# Patient Record
Sex: Male | Born: 1960 | Race: White | Hispanic: No | Marital: Single | Smoking: Current every day smoker
Health system: Southern US, Community
[De-identification: ages and names within clinical notes are randomized; demographics above are authoritative.]

## PROBLEM LIST (undated history)

## (undated) DIAGNOSIS — C01 Malignant neoplasm of base of tongue: Secondary | ICD-10-CM

## (undated) DIAGNOSIS — Z720 Tobacco use: Secondary | ICD-10-CM

## (undated) DIAGNOSIS — C099 Malignant neoplasm of tonsil, unspecified: Secondary | ICD-10-CM

## (undated) DIAGNOSIS — I10 Essential (primary) hypertension: Secondary | ICD-10-CM

## (undated) HISTORY — DX: Essential (primary) hypertension: I10

---

## 2015-08-03 ENCOUNTER — Encounter: Payer: Self-pay | Admitting: Emergency Medicine

## 2015-08-03 ENCOUNTER — Emergency Department: Payer: Medicaid Other

## 2015-08-03 ENCOUNTER — Emergency Department
Admission: EM | Admit: 2015-08-03 | Discharge: 2015-08-03 | Disposition: A | Discharge status: Home / Self Care | Payer: Medicaid Other | Attending: Emergency Medicine | Admitting: Emergency Medicine

## 2015-08-03 DIAGNOSIS — J359 Chronic disease of tonsils and adenoids, unspecified: Secondary | ICD-10-CM | POA: Diagnosis not present

## 2015-08-03 DIAGNOSIS — K920 Hematemesis: Secondary | ICD-10-CM | POA: Diagnosis present

## 2015-08-03 DIAGNOSIS — R220 Localized swelling, mass and lump, head: Secondary | ICD-10-CM

## 2015-08-03 DIAGNOSIS — F17210 Nicotine dependence, cigarettes, uncomplicated: Secondary | ICD-10-CM | POA: Diagnosis not present

## 2015-08-03 DIAGNOSIS — R042 Hemoptysis: Secondary | ICD-10-CM

## 2015-08-03 DIAGNOSIS — J069 Acute upper respiratory infection, unspecified: Secondary | ICD-10-CM

## 2015-08-03 DIAGNOSIS — J029 Acute pharyngitis, unspecified: Secondary | ICD-10-CM

## 2015-08-03 DIAGNOSIS — J358 Other chronic diseases of tonsils and adenoids: Secondary | ICD-10-CM

## 2015-08-03 LAB — COMPREHENSIVE METABOLIC PANEL
ALBUMIN: 3.9 g/dL (ref 3.5–5.0)
ALT: 17 U/L (ref 17–63)
AST: 22 U/L (ref 15–41)
Alkaline Phosphatase: 98 U/L (ref 38–126)
Anion gap: 8 (ref 5–15)
BUN: 17 mg/dL (ref 6–20)
CALCIUM: 9.1 mg/dL (ref 8.9–10.3)
CO2: 25 mmol/L (ref 22–32)
Chloride: 101 mmol/L (ref 101–111)
Creatinine, Ser: 0.79 mg/dL (ref 0.61–1.24)
GFR calc Af Amer: >60 mL/min (ref >60)
GFR calc non Af Amer: >60 mL/min (ref >60)
GLUCOSE: 103 mg/dL — AB (ref 65–99)
Potassium: 3.5 mmol/L (ref 3.5–5.1)
SODIUM: 134 mmol/L — AB (ref 135–145)
Total Bilirubin: 0.8 mg/dL (ref 0.3–1.2)
Total Protein: 8 g/dL (ref 6.5–8.1)

## 2015-08-03 LAB — RAPID INFLUENZA A&B ANTIGENS
Influenza A (ARMC): NOT DETECTED — AB
Influenza B (ARMC): NOT DETECTED — AB

## 2015-08-03 LAB — CBC
HEMATOCRIT: 44.1 % (ref 40.0–52.0)
HEMOGLOBIN: 15.1 g/dL (ref 13.0–18.0)
MCH: 31.5 pg (ref 26.0–34.0)
MCHC: 34.2 g/dL (ref 32.0–36.0)
MCV: 92.3 fL (ref 80.0–100.0)
Platelets: 184 10*3/uL (ref 150–440)
RBC: 4.78 MIL/uL (ref 4.40–5.90)
RDW: 13.4 % (ref 11.5–14.5)
WBC: 9.9 10*3/uL (ref 3.8–10.6)

## 2015-08-03 LAB — MONONUCLEOSIS SCREEN: Mono Screen: NEGATIVE

## 2015-08-03 LAB — LIPASE, BLOOD: Lipase: 34 U/L (ref 11–51)

## 2015-08-03 MED ORDER — ONDANSETRON HCL 4 MG/2ML IJ SOLN
4.0000 mg | Freq: Once | INTRAMUSCULAR | Status: AC
  Administered 2015-08-03: 4 mg via INTRAVENOUS

## 2015-08-03 MED ORDER — IOHEXOL 300 MG/ML  SOLN
75.0000 mL | Freq: Once | INTRAMUSCULAR | Status: AC | PRN
  Administered 2015-08-03: 75 mL via INTRAVENOUS

## 2015-08-03 MED ORDER — FENTANYL CITRATE (PF) 100 MCG/2ML IJ SOLN
50.0000 ug | Freq: Once | INTRAMUSCULAR | Status: AC
  Administered 2015-08-03: 50 ug via INTRAVENOUS

## 2015-08-03 MED ORDER — SODIUM CHLORIDE 0.9 % IV BOLUS (SEPSIS)
1000.0000 mL | Freq: Once | INTRAVENOUS | Status: AC
  Administered 2015-08-03: 1000 mL via INTRAVENOUS

## 2015-08-03 MED ORDER — OXYCODONE HCL 5 MG PO TABS: 5.0000 mg | ORAL_TABLET | ORAL | Status: DC | PRN

## 2015-08-03 MED ORDER — PENICILLIN G BENZATHINE 1200000 UNIT/2ML IM SUSP
1.2000 10*6.[IU] | Freq: Once | INTRAMUSCULAR | Status: AC
  Administered 2015-08-03: 1.2 10*6.[IU] via INTRAMUSCULAR
  Filled 2015-08-03: qty 2

## 2015-08-03 NOTE — ED Notes (Signed)
Vomiting blood since yesterday, 3 episodes.

## 2015-08-03 NOTE — Discharge Instructions (Signed)
Please make an appointment with Dr. Charolett Bumpers for further evaluation of your tonsil mass. These return to the emergency department if you develop severe pain, increased swelling, difficulty swallowing, difficulty breathing, severe headache, or any other symptoms concerning to you.

## 2015-08-03 NOTE — ED Notes (Signed)
Pt reports swelling to right side of neck and feeling tired for several days.  Pt has swelling noted to right side of neck and reports a cough also.

## 2015-08-03 NOTE — ED Provider Notes (Signed)
[facility] Emergency Department Provider Note  ____________________________________________  Time seen: Approximately 3:30 PM  I have reviewed the triage vital signs and the nursing notes.   HISTORY  Chief Complaint Hematemesis    HPI Ryan Rush is a 55 y.o. male with ongoing tobacco abuse but otherwise healthy presenting with cough, hemoptysis, hematemesis.Pt reports that for the past 2 days he has had a cough and swelling around the right jaw that is tender to palpation. Yesterday had a single episode of cough productive of blood-streaked sputum, which also occurred again today. Then he had one out of emesis that was dark red, and he thought it was blood. He denies any abdominal pain, black or tarry stools or melena. No fever, chills, sore throat, ear pain, myalgias.   History reviewed. No pertinent past medical history.  There are no active problems to display for this patient.   History reviewed. No pertinent past surgical history.  No current outpatient prescriptions on file.  Allergies Review of patient's allergies indicates no known allergies.  No family history on file.  Social History Social History  Substance Use Topics  . Smoking status: Current Every Day Smoker -- 1.00 packs/day    Types: Cigarettes  . Smokeless tobacco: None  . Alcohol Use: No    Review of Systems Constitutional: No fever/chills. No lightheadedness or syncope. Eyes: No visual changes. ENT: No sore throat. No congestion or rhinorrhea. No ear pain. Cardiovascular: Denies chest pain, palpitations. Respiratory: Denies shortness of breath.  Positive cough with blood. Gastrointestinal: No abdominal pain.  Positive nausea, positive vomiting of dark red material.  No diarrhea.  No constipation. No melena. Genitourinary: Negative for dysuria. Musculoskeletal: Negative for back pain. Skin: Negative for rash. Neurological: Negative for headaches, focal weakness or  numbness.  10-point ROS otherwise negative.  ____________________________________________   PHYSICAL EXAM:  VITAL SIGNS: ED Triage Vitals  Enc Vitals Group     BP 08/03/15 1319 166/98 mmHg     Pulse Rate 08/03/15 1319 107     Resp 08/03/15 1319 20     Temp 08/03/15 1319 98.9 F (37.2 C)     Temp Source 08/03/15 1319 Oral     SpO2 08/03/15 1319 96 %     Weight 08/03/15 1319 150 lb (68.04 kg)     Height 08/03/15 1319 5\' 9"  (1.753 m)     Head Cir --      Peak Flow --      Pain Score 08/03/15 1320 8     Pain Loc --      Pain Edu? --      Excl. in [place]? --     Constitutional: Alert and oriented. Well appearing and in no acute distress. Answer question appropriately. Eyes: Conjunctivae are normal.  EOMI. no scleral icterus. Head: Patient has swelling on the right side of the face by the angle of the mandible. He has overlying tenderness to palpation without fluctuance area did no obvious abscess. Possible enlarged lymph node. No lymphadenopathy of the posterior auricular chain or submandibular areas. Nose: No congestion/rhinnorhea. Mouth/Throat: Mucous membranes are dry.  Neck: No stridor.  Supple.  No JVD. No meningismus. Cardiovascular: Normal rate, regular rhythm. No murmurs, rubs or gallops.  Respiratory: Normal respiratory effort.  No retractions. Lungs CTAB.  No wheezes, rales or ronchi. Gastrointestinal: Soft and nontender. No distention. No peritoneal signs. No rebound or guarding. Musculoskeletal: No LE edema.  Neurologic:  Normal speech and language. No gross focal neurologic deficits are appreciated.  Skin:  Skin is warm, dry and intact. No rash noted. Psychiatric: Mood and affect are normal. Speech and behavior are normal.  Normal judgement.  ____________________________________________   LABS (all labs ordered are listed, but only abnormal results are displayed)  Labs Reviewed  RAPID INFLUENZA A&B ANTIGENS ([facility] ONLY) - Abnormal; Notable for the following:     Influenza A [facility]) NOT DETECTED (*)    Influenza B ([facility]) NOT DETECTED (*)    All other components within normal limits  COMPREHENSIVE METABOLIC PANEL - Abnormal; Notable for the following:    Sodium 134 (*)    Glucose, Bld 103 (*)    All other components within normal limits  LIPASE, BLOOD  CBC  MONONUCLEOSIS SCREEN   ____________________________________________  EKG  ED ECG REPORT I, Eula Listen, the attending physician, personally viewed and interpreted this ECG.   Date: 08/03/2015  EKG Time: 1658  Rate: 87  Rhythm: normal sinus rhythm  Axis: Normal  Intervals:none  ST&T Change: No ST elevation. No ischemic changes.  ____________________________________________  RADIOLOGY  Dg Chest 2 View  08/03/2015  CLINICAL DATA:  Swelling to RIGHT-side of neck and feeling tired for several days, cough, hematemesis EXAM: CHEST  2 VIEW COMPARISON:  None FINDINGS: Normal heart size, mediastinal contours, and pulmonary vascularity. Lungs clear. No pneumothorax. Bones unremarkable. IMPRESSION: Normal exam. Electronically Signed   By: Lavonia Dana M.D.   On: 08/03/2015 16:00   Ct Soft Tissue Neck W Contrast  08/03/2015  CLINICAL DATA:  Right-sided neck pain and swelling for 3 days. EXAM: CT NECK WITH CONTRAST TECHNIQUE: Multidetector CT imaging of the neck was performed using the standard protocol following the bolus administration of intravenous contrast. CONTRAST:  44mL OMNIPAQUE IOHEXOL 300 MG/ML  SOLN COMPARISON:  None. FINDINGS: Pharynx and larynx: There is necrotic mass in the right oropharynx with debris in the cavity. This narrows the oropharynx moderately. The mass is difficult to measure, but the ulcer base measures at least 22 mm in diameter (at least T2). No invasive features seen in the intrinsic tongue muscles or neighboring bone. There is bilateral cavitary cervical lymph nodes on the right levels 2 and 3 and on the left level 2 at least. The right-sided adenopathy in total  measures up to 43 mm in maximal axial dimension and has coalesced due to extensive extracapsular disease. (at least N2c). There is narrowing of the right internal jugular vein without thrombosis. High-grade proximal right ICA stenosis, but likely atherosclerotic. No definitive carotid encasement. There is extensive submucosal edema in the pharynx and supraglottic larynx with prevertebral fluid that is non loculated rim enhancing. Given the acute symptomatology and absent treatment history this may reflect superimposed infection. Salivary glands: No primary mass is seen Thyroid: Negative Vascular: Right ICA stenosis as above. Right carotid sheath distortion by a malignant adenopathy Limited intracranial: Negative Mastoids and visualized paranasal sinuses: Clear Skeleton: No malignant lytic or blastic lesions are seen. Advanced periodontal disease in the right posterior maxilla. Upper chest: No apical nodules. These results were called by telephone at the time of interpretation on 08/03/2015 at 4:31 pm to Dr. Eula Listen , who verbally acknowledged these results. IMPRESSION: 1. 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 Bilateral cervical malignant lymphadenopathy. Further staging considerations above. 2. Pharynx and supraglottic larynx submucosal edema which may reflect a superimposed acute infection and cause of the acute symptomatology. 3. Advanced atherosclerosis with high-grade right ICA stenosis. Electronically Signed   By: Monte Fantasia M.D.   On:  08/03/2015 16:41    ____________________________________________   PROCEDURES  Procedure(s) performed: None  Critical Care performed: No ____________________________________________   INITIAL IMPRESSION / ASSESSMENT AND PLAN / ED COURSE  Pertinent labs & imaging results that were available during my care of the patient were reviewed by me and considered in my medical decision making (see chart for  details).  55 y.o. male with a smoking history presenting with 2 days of cough, mild hemoptysis and possible hematemesis, as well as pain and swelling of the right face. The patient may just have a viral syndrome which is resulting in lymphadenopathy of the cervical chain and a cough resulting in hemoptysis from irritation. However, given his smoking history I will get a chest x-ray to evaluate for any lung masses and a CT of the neck for further evaluation of the swelling in his lower face and neck. No plan to treat his pain. He does not have any instability and his vital signs nor does he have anemia so acute GI hemorrhage is very unlikely.  ----------------------------------------- 4:34 PM on 08/03/2015 -----------------------------------------  Called by radiology, the CT of his neck shows what is likely a right tonsillar malignancy with overlying inflammation which favors superimposed pharyngitis. The patient's chest x-ray does not show any acute process. Plan to treat him with antibiotics for his pharyngitis, and I will call ENT regarding the tonsillar findings.  ----------------------------------------- 5:27 PM on 08/03/2015 -----------------------------------------  I've spoken with Dr. Kristine Garbe,, ENT, and he will follow-up the patient this week in his outpatient clinic. I will will give the patient penicillin here, as he will be unable to afford antibiotics as he is homeless. Patient understands the findings on his CT scan, as well as the importance of close follow-up. He understands return precautions as well as discharge instructions.  ____________________________________________  FINAL CLINICAL IMPRESSION(S) / ED DIAGNOSES  Final diagnoses:  Tonsillar mass  Pharyngitis  Hemoptysis  Hematemesis with nausea      NEW MEDICATIONS STARTED DURING THIS VISIT:  New Prescriptions   No medications on file     Eula Listen, MD 08/03/15 1728

## 2015-08-03 NOTE — ED Notes (Signed)
Iv placed by [employer] in [place].

## 2015-08-03 NOTE — ED Notes (Signed)
Lab results reviewed. Awaiting room placement.

## 2015-08-03 NOTE — ED Notes (Signed)
Pt alert  States feeling some better.  Iv fluids infused

## 2015-08-10 ENCOUNTER — Encounter: Payer: Self-pay | Admitting: Oncology

## 2015-08-10 ENCOUNTER — Inpatient Hospital Stay: Payer: Medicaid Other

## 2015-08-10 ENCOUNTER — Inpatient Hospital Stay: Payer: Medicaid Other | Attending: Oncology | Admitting: Oncology

## 2015-08-10 VITALS — BP 142/79 | HR 93 | Temp 98.2°F | Resp 18 | Wt 151.4 lb

## 2015-08-10 DIAGNOSIS — R1310 Dysphagia, unspecified: Secondary | ICD-10-CM | POA: Diagnosis not present

## 2015-08-10 DIAGNOSIS — F17210 Nicotine dependence, cigarettes, uncomplicated: Secondary | ICD-10-CM | POA: Diagnosis not present

## 2015-08-10 DIAGNOSIS — C787 Secondary malignant neoplasm of liver and intrahepatic bile duct: Secondary | ICD-10-CM | POA: Diagnosis not present

## 2015-08-10 DIAGNOSIS — C7951 Secondary malignant neoplasm of bone: Secondary | ICD-10-CM | POA: Insufficient documentation

## 2015-08-10 DIAGNOSIS — R599 Enlarged lymph nodes, unspecified: Secondary | ICD-10-CM | POA: Diagnosis not present

## 2015-08-10 DIAGNOSIS — Z590 Homelessness: Secondary | ICD-10-CM | POA: Diagnosis not present

## 2015-08-10 DIAGNOSIS — C01 Malignant neoplasm of base of tongue: Secondary | ICD-10-CM | POA: Diagnosis present

## 2015-08-10 DIAGNOSIS — R221 Localized swelling, mass and lump, neck: Secondary | ICD-10-CM

## 2015-08-10 DIAGNOSIS — C760 Malignant neoplasm of head, face and neck: Secondary | ICD-10-CM

## 2015-08-10 LAB — COMPREHENSIVE METABOLIC PANEL
ALT: 11 U/L — ABNORMAL LOW (ref 17–63)
ANION GAP: 6 (ref 5–15)
AST: 17 U/L (ref 15–41)
Albumin: 3.8 g/dL (ref 3.5–5.0)
Alkaline Phosphatase: 72 U/L (ref 38–126)
BUN: 12 mg/dL (ref 6–20)
CHLORIDE: 96 mmol/L — AB (ref 101–111)
CO2: 28 mmol/L (ref 22–32)
Calcium: 8.7 mg/dL — ABNORMAL LOW (ref 8.9–10.3)
Creatinine, Ser: 0.81 mg/dL (ref 0.61–1.24)
Glucose, Bld: 116 mg/dL — ABNORMAL HIGH (ref 65–99)
POTASSIUM: 3.2 mmol/L — AB (ref 3.5–5.1)
Sodium: 130 mmol/L — ABNORMAL LOW (ref 135–145)
Total Bilirubin: 0.6 mg/dL (ref 0.3–1.2)
Total Protein: 8.1 g/dL (ref 6.5–8.1)

## 2015-08-10 LAB — CBC WITH DIFFERENTIAL/PLATELET
BASOS ABS: 0.1 10*3/uL (ref 0–0.1)
Basophils Relative: 1 %
EOS PCT: 1 %
Eosinophils Absolute: 0.1 10*3/uL (ref 0–0.7)
HCT: 41.5 % (ref 40.0–52.0)
Hemoglobin: 14.1 g/dL (ref 13.0–18.0)
LYMPHS ABS: 1.4 10*3/uL (ref 1.0–3.6)
LYMPHS PCT: 18 %
MCH: 31.6 pg (ref 26.0–34.0)
MCHC: 34 g/dL (ref 32.0–36.0)
MCV: 92.9 fL (ref 80.0–100.0)
MONO ABS: 0.6 10*3/uL (ref 0.2–1.0)
Monocytes Relative: 7 %
Neutro Abs: 5.9 10*3/uL (ref 1.4–6.5)
Neutrophils Relative %: 73 %
PLATELETS: 246 10*3/uL (ref 150–440)
RBC: 4.46 MIL/uL (ref 4.40–5.90)
RDW: 13.9 % (ref 11.5–14.5)
WBC: 8.1 10*3/uL (ref 3.8–10.6)

## 2015-08-10 LAB — MAGNESIUM: MAGNESIUM: 1.9 mg/dL (ref 1.7–2.4)

## 2015-08-10 NOTE — Progress Notes (Signed)
Patient here today as new evaluation regarding tonsilar cancer.  Referred by Dr. Charolett Bumpers.

## 2015-08-10 NOTE — Progress Notes (Signed)
[place] @ [facility] [phone]  Fax:([phone]) [place] OB: 02/11/1961  MR#: [id number]  PNP#:[id number]  No care team member to display  CHIEF COMPLAINT:  Chief Complaint  Patient presents with  . 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    Oncology Flowsheet 08/03/2015  ondansetron (ZOFRAN) IV 4 mg    INTERVAL HISTORY:55 year old gentleman was noticed swelling on the right side of the neck over 2 of last 3-6 months.  Patient is unfortunately home less very poor access to medical care went to emergency room and swelling but still bigger.  And has some difficulty swallowing. Evaluated by ENT physician and needle biopsy of the tonsillar mass was done CT scan of soft tissue revealed extensive bilateral disease with soft tissue mass Needle biopsy was necrotic tissue so another biopsy being planned.  Patient denies any history of difficulty swallowing or any significant weight loss.  Has pain but presently Tylenol is helping.  [place] and has no family.  He smokes but according to him he does not drink REVIEW OF SYSTEMS:   Gen. status alert oriented individual in performance status of 1 HEENT: Does describe a bowel swelling on the right side of the neck gradually getting worse.  Increasing difficulty in swallowing. Lungs: Patient is chronic smoker.  Shortness of breath on exertion Cardiac: No chest pain no history of hypertension GI: No nausea no vomiting no diarrhea GU: No  dysuria hematuria Skin: No rash. Neurological system no tingling.  No numbness.   Lower  extremities no swelling All other 12 systems have been reviewed  As per HPI. Otherwise, a complete review of systems is negatve.  PAST MEDICAL HISTORY: No significant past medical history   PAST SURGICAL HISTORY:   No Significant past surgical history  FAMILY HISTORY There is family history of breast cancer but patient does not remember any specific       ADVANCED DIRECTIVES:  Patient does not have any living will or healthcare power of attorney.  Information was given .  Available resources had been discussed.  We will follow-up on subsequent appointments regarding this issue  HEALTH MAINTENANCE: Social History  Substance Use Topics  . Smoking status: Current Every Day Smoker -- 1.00 packs/day    Types: Cigarettes  . Smokeless tobacco: None  . Alcohol Use: No   Patient is homeless.  He has very poor access to the medical care. 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 status: Patient is alert oriented not any acute distress. HEENT: There is massive tonsillar mass pushing the uvula on other side from the right side mass arising from PS to be from tonsils. There are significantly enlarged palpable lymph node on both side of the neck Lymphatic system: Bilateral cervical enlargement of lymph nodes no other palpable lymph nodes Lungs: Emphysematous chest diminished air entry on both sides GI: Abdomen  is soft liver and spleen not palpable GU: No dysuria or hematuria Skin: No rash Neurological system: Higher functions within normal limits.  Cranial nerves are intact.  No motor or sensory deficit  Musculoskeletal system within normal limit. All other systems have been reviewed  Filed Vitals:   08/10/15 1411  BP: 142/79  Pulse: 93  Temp: 98.2 F (36.8 C)  Resp: 18     Body mass index is 22.34 kg/(m^2).     ECOG FS:1 - Symptomatic but completely ambulatory  LAB RESULTS:  No visits with results within 5 Day(s) from this visit. Latest known visit with results is:  Admission on 08/03/2015, Discharged on 08/03/2015  Component Date Value Ref Range Status  . Lipase 08/03/2015 34  11 - 51 U/L Final  . Sodium 08/03/2015 134* 135 - 145 mmol/L Final  . Potassium 08/03/2015 3.5  3.5 - 5.1 mmol/L Final  . Chloride 08/03/2015 101  101 - 111 mmol/L Final  . CO2 08/03/2015 25  22 - 32 mmol/L Final  . Glucose, Bld 08/03/2015 103* 65 - 99 mg/dL Final  . BUN 08/03/2015 17  6 - 20 mg/dL Final  . Creatinine, Ser 08/03/2015 0.79  0.61 - 1.24 mg/dL Final  . Calcium 08/03/2015 9.1  8.9 - 10.3 mg/dL Final  . Total Protein 08/03/2015 8.0  6.5 - 8.1 g/dL Final  . Albumin 08/03/2015 3.9  3.5 - 5.0 g/dL Final  . AST 08/03/2015 22  15 - 41 U/L Final  . ALT 08/03/2015 17  17 - 63 U/L Final  . Alkaline Phosphatase 08/03/2015 98  38 - 126 U/L Final  . Total Bilirubin 08/03/2015 0.8  0.3 - 1.2 mg/dL Final  . GFR calc non Af Amer 08/03/2015 >60  >60 mL/min Final  . GFR calc Af Amer 08/03/2015 >60  >60 mL/min Final   Comment: (NOTE) The eGFR has been calculated using the CKD EPI equation. This calculation has not been validated in all clinical situations. eGFR's persistently <60 mL/min signify possible Chronic Kidney Disease.   . Anion gap 08/03/2015 8  5 - 15 Final  . WBC 08/03/2015 9.9  3.8 - 10.6 K/uL Final  . RBC 08/03/2015 4.78  4.40 - 5.90 MIL/uL Final  . Hemoglobin 08/03/2015 15.1  13.0 - 18.0 g/dL Final  . HCT 08/03/2015 44.1  40.0 - 52.0 % Final  . MCV 08/03/2015 92.3  80.0 - 100.0 fL Final  . MCH 08/03/2015 31.5  26.0 - 34.0 pg Final  . MCHC 08/03/2015 34.2  32.0 - 36.0 g/dL Final  . RDW 08/03/2015 13.4  11.5 - 14.5 % Final  . Platelets 08/03/2015 184  150 - 440 K/uL Final  . Influenza A ([facility]) 08/03/2015 NOT DETECTED* NEGATIVE Final  . Influenza B ([facility]) 08/03/2015 NOT DETECTED* NEGATIVE Final  .  Mono Screen 08/03/2015 NEGATIVE  NEGATIVE Final     STUDIES: Dg Chest 2 View  08/03/2015  CLINICAL DATA:  Swelling to RIGHT-side of neck and feeling tired for several days, cough, hematemesis EXAM: CHEST  2 VIEW COMPARISON:  None FINDINGS: Normal heart size, mediastinal contours, and pulmonary vascularity. Lungs clear. No pneumothorax. Bones unremarkable. IMPRESSION: Normal exam. Electronically Signed   By: Lavonia Dana M.D.   On: 08/03/2015 16:00   Ct Soft Tissue Neck W Contrast  08/03/2015  CLINICAL DATA:  Right-sided neck pain and swelling for 3 days. EXAM: CT NECK WITH CONTRAST TECHNIQUE: Multidetector CT imaging of the neck was performed using the standard protocol following the bolus  administration of intravenous contrast. CONTRAST:  25m OMNIPAQUE IOHEXOL 300 MG/ML  SOLN COMPARISON:  None. FINDINGS: Pharynx and larynx: There is necrotic mass in the right oropharynx with debris in the cavity. This narrows the oropharynx moderately. The mass is difficult to measure, but the ulcer base measures at least 22 mm in diameter (at least T2). No invasive features seen in the intrinsic tongue muscles or neighboring bone. There is bilateral cavitary cervical lymph nodes on the right levels 2 and 3 and on the left level 2 at least. The right-sided adenopathy in total measures up to 43 mm in maximal axial dimension and has coalesced due to extensive extracapsular disease. (at least N2c). There is narrowing of the right internal jugular vein without thrombosis. High-grade proximal right ICA stenosis, but likely atherosclerotic. No definitive carotid encasement. There is extensive submucosal edema in the pharynx and supraglottic larynx with prevertebral fluid that is non loculated rim enhancing. Given the acute symptomatology and absent treatment history this may reflect superimposed infection. Salivary glands: No primary mass is seen Thyroid: Negative Vascular: Right ICA stenosis as above. Right carotid sheath  distortion by a malignant adenopathy Limited intracranial: Negative Mastoids and visualized paranasal sinuses: Clear Skeleton: No malignant lytic or blastic lesions are seen. Advanced periodontal disease in the right posterior maxilla. Upper chest: No apical nodules. These results were called by telephone at the time of interpretation on 08/03/2015 at 4:31 pm to Dr. AEula Listen, who verbally acknowledged these results. IMPRESSION: 1. 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 Bilateral cervical malignant lymphadenopathy. Further staging considerations above. 2. Pharynx and supraglottic larynx submucosal edema which may reflect a superimposed acute infection and cause of the acute symptomatology. 3. Advanced atherosclerosis with high-grade right ICA stenosis. Electronically Signed   By: JMonte FantasiaM.D.   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 JMyna Hidalgo  Further biopsy is needed to establish diagnosis even though clinically is very apocrine that patient has a squamous cell carcinoma of tonsil locally advanced disease At least clinically stage is T4 N3 MX stage IVBI   Or  IVA disease 2.  Repeat biopsy of lymph node has been recommended 3.  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    Patient expressed understanding and was in agreement with this plan. He also understands that He can call clinic at any time with any questions, concerns, or complaints.    No matching staging information was found for the patient.  JForest Gleason MD   08/10/2015 2:51 PM

## 2015-08-17 ENCOUNTER — Ambulatory Visit: Payer: Self-pay

## 2015-08-17 ENCOUNTER — Inpatient Hospital Stay
Admission: AD | Admit: 2015-08-17 | Discharge: 2015-08-28 | DRG: 847 | Disposition: A | Discharge status: Skilled Nursing Facility | Payer: Medicaid Other | Source: Ambulatory Visit | Attending: Oncology | Admitting: Oncology

## 2015-08-17 ENCOUNTER — Encounter: Payer: Self-pay | Admitting: Oncology

## 2015-08-17 ENCOUNTER — Inpatient Hospital Stay: Payer: Medicaid Other | Admitting: Oncology

## 2015-08-17 ENCOUNTER — Encounter: Payer: Self-pay | Admitting: *Deleted

## 2015-08-17 ENCOUNTER — Telehealth: Payer: Self-pay | Admitting: Pharmacist

## 2015-08-17 VITALS — BP 175/100 | HR 85 | Temp 98.0°F | Ht 69.0 in | Wt 148.1 lb

## 2015-08-17 DIAGNOSIS — F17210 Nicotine dependence, cigarettes, uncomplicated: Secondary | ICD-10-CM | POA: Diagnosis present

## 2015-08-17 DIAGNOSIS — G893 Neoplasm related pain (acute) (chronic): Secondary | ICD-10-CM | POA: Diagnosis present

## 2015-08-17 DIAGNOSIS — C098 Malignant neoplasm of overlapping sites of tonsil: Secondary | ICD-10-CM | POA: Diagnosis present

## 2015-08-17 DIAGNOSIS — Z803 Family history of malignant neoplasm of breast: Secondary | ICD-10-CM | POA: Diagnosis not present

## 2015-08-17 DIAGNOSIS — C01 Malignant neoplasm of base of tongue: Secondary | ICD-10-CM | POA: Diagnosis present

## 2015-08-17 DIAGNOSIS — Z590 Homelessness: Secondary | ICD-10-CM

## 2015-08-17 DIAGNOSIS — C779 Secondary and unspecified malignant neoplasm of lymph node, unspecified: Secondary | ICD-10-CM | POA: Diagnosis present

## 2015-08-17 DIAGNOSIS — Z5111 Encounter for antineoplastic chemotherapy: Principal | ICD-10-CM

## 2015-08-17 DIAGNOSIS — D6959 Other secondary thrombocytopenia: Secondary | ICD-10-CM | POA: Diagnosis not present

## 2015-08-17 DIAGNOSIS — E876 Hypokalemia: Secondary | ICD-10-CM | POA: Diagnosis not present

## 2015-08-17 DIAGNOSIS — C099 Malignant neoplasm of tonsil, unspecified: Secondary | ICD-10-CM

## 2015-08-17 DIAGNOSIS — C787 Secondary malignant neoplasm of liver and intrahepatic bile duct: Secondary | ICD-10-CM | POA: Diagnosis present

## 2015-08-17 DIAGNOSIS — R12 Heartburn: Secondary | ICD-10-CM

## 2015-08-17 DIAGNOSIS — T451X5A Adverse effect of antineoplastic and immunosuppressive drugs, initial encounter: Secondary | ICD-10-CM | POA: Diagnosis not present

## 2015-08-17 DIAGNOSIS — K5900 Constipation, unspecified: Secondary | ICD-10-CM | POA: Diagnosis not present

## 2015-08-17 DIAGNOSIS — Z79899 Other long term (current) drug therapy: Secondary | ICD-10-CM

## 2015-08-17 DIAGNOSIS — R1310 Dysphagia, unspecified: Secondary | ICD-10-CM

## 2015-08-17 DIAGNOSIS — R1013 Epigastric pain: Secondary | ICD-10-CM | POA: Diagnosis not present

## 2015-08-17 DIAGNOSIS — C7951 Secondary malignant neoplasm of bone: Secondary | ICD-10-CM | POA: Diagnosis present

## 2015-08-17 DIAGNOSIS — I10 Essential (primary) hypertension: Secondary | ICD-10-CM | POA: Diagnosis present

## 2015-08-17 DIAGNOSIS — E871 Hypo-osmolality and hyponatremia: Secondary | ICD-10-CM | POA: Diagnosis not present

## 2015-08-17 DIAGNOSIS — D701 Agranulocytosis secondary to cancer chemotherapy: Secondary | ICD-10-CM | POA: Diagnosis not present

## 2015-08-17 LAB — SURGICAL PCR SCREEN
MRSA, PCR: NEGATIVE
STAPHYLOCOCCUS AUREUS: NEGATIVE

## 2015-08-17 MED ORDER — SODIUM CHLORIDE 0.9 % IV SOLN: 75.0000 mg/m2 | Freq: Once | INTRAVENOUS | Status: DC

## 2015-08-17 MED ORDER — SODIUM CHLORIDE 0.9 % IV SOLN
2200.0000 mg/m2 | INTRAVENOUS | Status: DC
  Filled 2015-08-17: qty 80

## 2015-08-17 MED ORDER — DOCETAXEL CHEMO INJECTION 160 MG/16ML: 65.0000 mg/m2 | Freq: Once | INTRAVENOUS | Status: DC

## 2015-08-17 MED ORDER — CEFAZOLIN SODIUM 1-5 GM-% IV SOLN
1.0000 g | INTRAVENOUS | Status: DC
  Filled 2015-08-17: qty 50

## 2015-08-17 MED ORDER — POTASSIUM CHLORIDE 2 MEQ/ML IV SOLN: Freq: Once | INTRAVENOUS | Status: DC

## 2015-08-17 MED ORDER — SODIUM CHLORIDE 0.9 % IV SOLN
INTRAVENOUS | Status: DC
  Administered 2015-08-17 – 2015-08-28 (×26): via INTRAVENOUS

## 2015-08-17 MED ORDER — AMLODIPINE BESYLATE 5 MG PO TABS
5.0000 mg | ORAL_TABLET | Freq: Every day | ORAL | Status: DC
  Administered 2015-08-17 – 2015-08-28 (×12): 5 mg via ORAL
  Filled 2015-08-17 (×12): qty 1

## 2015-08-17 MED ORDER — SODIUM CHLORIDE 0.9 % IV SOLN: Freq: Once | INTRAVENOUS | Status: DC

## 2015-08-17 MED ORDER — HYDROCODONE-ACETAMINOPHEN 5-325 MG PO TABS
1.0000 | ORAL_TABLET | Freq: Four times a day (QID) | ORAL | Status: DC | PRN
  Administered 2015-08-17 – 2015-08-21 (×11): 2 via ORAL
  Administered 2015-08-21 (×2): 1 via ORAL
  Administered 2015-08-22 – 2015-08-24 (×11): 2 via ORAL
  Administered 2015-08-25: 1 via ORAL
  Administered 2015-08-25 – 2015-08-27 (×4): 2 via ORAL
  Filled 2015-08-17 (×6): qty 2
  Filled 2015-08-17: qty 1
  Filled 2015-08-17 (×3): qty 2
  Filled 2015-08-17: qty 1
  Filled 2015-08-17 (×16): qty 2
  Filled 2015-08-17: qty 1
  Filled 2015-08-17 (×3): qty 2

## 2015-08-17 MED ORDER — PALONOSETRON HCL INJECTION 0.25 MG/5ML: 0.2500 mg | Freq: Once | INTRAVENOUS | Status: DC

## 2015-08-17 NOTE — Progress Notes (Signed)
Report called to Ryan Poles, RN on 1C. Pt ambulatory and was escorted to room 120 by volunteer. Pt last ate at 0900 on 3/13. Pt was given instructions to not eat or drink anything until gets further instructions from nursing on floor since is being scheduled for PET scan. Pt verbalized understanding.

## 2015-08-17 NOTE — Progress Notes (Signed)
Patient states that he is homeless.  Elevated BP, here for a follow up to a CT scan on 08/03/15
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
## 2015-08-17 NOTE — Telephone Encounter (Signed)
Spoke with Dr. Oliva Bustard. Since patient does not have a port at this time, the chemotherapy will not be started today. Possible port placement tomorrow. I will check with Dr. Oliva Bustard tomorrow to confirm date to begin treatment.  Spoke with Olivia Mackie, RN on 1C regarding waiting on therapy to start until port has been placed.

## 2015-08-17 NOTE — Progress Notes (Signed)
Patient was admitted in the hospital so please see  HISTORY   AND   physical dictated in the hospital chart

## 2015-08-17 NOTE — H&P (Signed)
[place] @ [facility] [phone]  Fax:([phone]) [place] OB: 02/02/1961  MR#: [id number]  JIR#:[id number]  Patient Care Team: Margaretha Sheffield, MD (Otolaryngology)  CHIEF COMPLAINT:  No chief complaint on file.  Clinically suspected squamous cell carcinoma of tonsil with extensive metastases to bilateral lymph nodes Initial biopsy was necrotic tissues so needle biopsy has been planned from the neck lymph node (CT scan diagnosis in March of 2017).  Needle biopsy of the lymph node from the right side of the neck is positive for poorly differentiated squamous cell carcinoma   stage is T4 N3 M0 stage IV a.  Further staging with PET scan is pending VISIT DIAGNOSIS:     ICD-9-CM ICD-10-CM   1. Tonsil cancer (HCC) 146.0 C09.9 0.9 %  sodium chloride infusion     amLODipine (NORVASC) tablet 5 mg     NM PET Image Initial (PI) Skull Base To Thigh     NM PET Image Initial (PI) Skull Base To Thigh     fluorouracil (ADRUCIL) 4,000 mg in sodium chloride 0.9 % 70 mL chemo infusion  2. 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    Oncology Flowsheet 08/03/2015  ondansetron (ZOFRAN) IV 4 mg    INTERVAL HISTORY:55 year old gentleman was noticed swelling on the right side of the neck over 2 of last 3-6 months.  Patient is unfortunately home less very poor access to medical care went to emergency room and swelling but still bigger.   Patient was admitted in hospital to initiate chemotherapy with 5-FU by continuous infusion Taxotere and cis-platinum. Needle biopsy from the lymph node was positive for poorly differentiated squamous cell carcinoma Patient continues to some difficulty swallowing. Patient's social situation is very poor and his home  less.  REVIEW OF SYSTEMS:   Gen. status alert oriented individual in performance status of 1 HEENT: Does describe a bowel swelling on the right side of the neck gradually getting worse.  Increasing difficulty in swallowing. Lungs: Patient is chronic smoker.  Shortness of breath on exertion Cardiac: No chest pain no history of hypertension GI: No nausea no vomiting no diarrhea GU: No dysuria hematuria Skin: No rash. Neurological system no tingling.  No numbness.   Lower  extremities no swelling All other 12 systems have been reviewed  As per HPI. Otherwise, a complete review of systems is negatve.  PAST MEDICAL HISTORY: No significant past medical history   PAST SURGICAL HISTORY:   No Significant past surgical history  FAMILY HISTORY There is family history of breast cancer but patient does not remember any specific       ADVANCED DIRECTIVES:  Patient does not have any living will or healthcare power of attorney.  Information was given .  Available resources had been discussed.  We will follow-up on subsequent appointments regarding this issue  HEALTH MAINTENANCE: Social History  Substance Use Topics  . Smoking status: Current Every Day Smoker -- 1.00 packs/day    Types: Cigarettes  . Smokeless tobacco: None  . Alcohol Use: No   Patient is homeless.  He has very poor access to the medical care. 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 status: Patient is alert oriented not any acute distress. HEENT: There is massive tonsillar mass pushing the uvula on other side from the right side mass arising from PS to be from tonsils. There are significantly enlarged palpable lymph node on both side of the neck Lymphatic system: Bilateral cervical enlargement of lymph nodes no other palpable lymph nodes Lungs: Emphysematous chest diminished air entry on both sides GI: Abdomen is soft liver and spleen not  palpable GU: No dysuria or hematuria Skin: No rash Neurological system: Higher functions within normal limits.  Cranial nerves are intact.  No motor or sensory deficit  Musculoskeletal system within normal limit. All other systems have been reviewed  Filed Vitals:   08/17/15 1137  BP: 141/69  Pulse: 72  Temp: 97.9 F (36.6 C)     Body mass index is 21.55 kg/(m^2).    ECOG FS:1 - Symptomatic but completely ambulatory  LAB RESULTS:  No visits with results within 5 Day(s) from this visit. Latest known visit with results is:  Office Visit on 08/10/2015  Component Date Value Ref Range Status  . WBC 08/10/2015 8.1  3.8 - 10.6 K/uL Final  . RBC 08/10/2015 4.46  4.40 - 5.90 MIL/uL Final  . Hemoglobin 08/10/2015 14.1  13.0 - 18.0 g/dL Final  . HCT 08/10/2015 41.5  40.0 - 52.0 % Final  . MCV 08/10/2015 92.9  80.0 - 100.0 fL Final  . MCH 08/10/2015 31.6  26.0 - 34.0 pg Final  . MCHC 08/10/2015 34.0  32.0 - 36.0 g/dL Final  . RDW 08/10/2015 13.9  11.5 - 14.5 % Final  . Platelets 08/10/2015 246  150 - 440 K/uL Final  . Neutrophils Relative % 08/10/2015 73   Final  . Neutro Abs 08/10/2015 5.9  1.4 - 6.5 K/uL Final  . Lymphocytes Relative 08/10/2015 18   Final  . Lymphs Abs 08/10/2015 1.4  1.0 - 3.6 K/uL Final  . Monocytes Relative 08/10/2015 7   Final  . Monocytes Absolute 08/10/2015 0.6  0.2 - 1.0 K/uL Final  . Eosinophils Relative 08/10/2015 1   Final  . Eosinophils Absolute 08/10/2015 0.1  0 - 0.7 K/uL Final  . Basophils Relative 08/10/2015 1   Final  . Basophils Absolute 08/10/2015 0.1  0 - 0.1 K/uL Final  . Sodium 08/10/2015 130* 135 - 145 mmol/L Final  . Potassium 08/10/2015 3.2* 3.5 - 5.1 mmol/L Final  . Chloride 08/10/2015 96* 101 - 111 mmol/L Final  . CO2 08/10/2015 28  22 - 32 mmol/L Final  . Glucose, Bld 08/10/2015 116* 65 - 99 mg/dL Final  . BUN 08/10/2015 12  6 - 20 mg/dL Final  . Creatinine, Ser 08/10/2015 0.81  0.61 - 1.24 mg/dL Final  . Calcium 08/10/2015 8.7* 8.9 -  10.3 mg/dL Final  . Total Protein 08/10/2015 8.1  6.5 - 8.1 g/dL Final  . Albumin 08/10/2015 3.8  3.5 - 5.0 g/dL Final  . AST 08/10/2015 17  15 - 41 U/L Final  . ALT 08/10/2015 11* 17 - 63 U/L Final  . Alkaline Phosphatase 08/10/2015 72  38 - 126 U/L Final  . Total Bilirubin 08/10/2015 0.6  0.3 - 1.2 mg/dL Final  . GFR calc non Af Amer 08/10/2015 >60  >60 mL/min Final  . GFR calc Af Amer 08/10/2015 >60  >60 mL/min Final   Comment: (NOTE) The eGFR has been calculated using the CKD EPI equation. This calculation has not been validated in all clinical situations. eGFR's  persistently <60 mL/min signify possible Chronic Kidney Disease.   . Anion gap 08/10/2015 6  5 - 15 Final  . Magnesium 08/10/2015 1.9  1.7 - 2.4 mg/dL Final     STUDIES: Dg Chest 2 View  08/03/2015  CLINICAL DATA:  Swelling to RIGHT-side of neck and feeling tired for several days, cough, hematemesis EXAM: CHEST  2 VIEW COMPARISON:  None FINDINGS: Normal heart size, mediastinal contours, and pulmonary vascularity. Lungs clear. No pneumothorax. Bones unremarkable. IMPRESSION: Normal exam. Electronically Signed   By: Lavonia Dana M.D.   On: 08/03/2015 16:00   Ct Soft Tissue Neck W Contrast  08/03/2015  CLINICAL DATA:  Right-sided neck pain and swelling for 3 days. EXAM: CT NECK WITH CONTRAST TECHNIQUE: Multidetector CT imaging of the neck was performed using the standard protocol following the bolus administration of intravenous contrast. CONTRAST:  105m OMNIPAQUE IOHEXOL 300 MG/ML  SOLN COMPARISON:  None. FINDINGS: Pharynx and larynx: There is necrotic mass in the right oropharynx with debris in the cavity. This narrows the oropharynx moderately. The mass is difficult to measure, but the ulcer base measures at least 22 mm in diameter (at least T2). No invasive features seen in the intrinsic tongue muscles or neighboring bone. There is bilateral cavitary cervical lymph nodes on the right levels 2 and 3 and on the left level 2 at  least. The right-sided adenopathy in total measures up to 43 mm in maximal axial dimension and has coalesced due to extensive extracapsular disease. (at least N2c). There is narrowing of the right internal jugular vein without thrombosis. High-grade proximal right ICA stenosis, but likely atherosclerotic. No definitive carotid encasement. There is extensive submucosal edema in the pharynx and supraglottic larynx with prevertebral fluid that is non loculated rim enhancing. Given the acute symptomatology and absent treatment history this may reflect superimposed infection. Salivary glands: No primary mass is seen Thyroid: Negative Vascular: Right ICA stenosis as above. Right carotid sheath distortion by a malignant adenopathy Limited intracranial: Negative Mastoids and visualized paranasal sinuses: Clear Skeleton: No malignant lytic or blastic lesions are seen. Advanced periodontal disease in the right posterior maxilla. Upper chest: No apical nodules. These results were called by telephone at the time of interpretation on 08/03/2015 at 4:31 pm to Dr. AEula Listen, who verbally acknowledged these results. IMPRESSION: 1. 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 Bilateral cervical malignant lymphadenopathy. Further staging considerations above. 2. Pharynx and supraglottic larynx submucosal edema which may reflect a superimposed acute infection and cause of the acute symptomatology. 3. Advanced atherosclerosis with high-grade right ICA stenosis. Electronically Signed   By: JMonte FantasiaM.D.   On: 08/03/2015 16:41    ASSESSMENT: Clinically diagnosed squamous cell carcinoma of right tonsil with extensive involvement and extracapsular involvement based on CT scan on both sides of the neck lymph node Biopsies positive for squamous cell carcinoma Proceed with chemotherapy 5-FU by continuous infusion because of poor social situation and initial poor nutritional condition dose  will be reduced to 3 days of 5-FU continuous infusion and 15% reduction for Taxotere and cis-platinum Informed consent has been OBTAINED. We will proceed with port placement for second chemotherapy cycle PET scan also would be ordered   PLAN:   I discussed situation with Dr. JMyna Hidalgo  Further biopsy is needed to establish diagnosis even though clinically is very apocrine that patient has a squamous cell carcinoma of tonsil locally advanced disease At least clinically stage is T4 N3 MX stage IVBI   Or  IVA disease PET  scan. Start chemotherapy Port placement  All the side effects of chemotherapy including myelosuppression, alopecia, nausea vomiting fatigue weakness.  Secondary infection, and   peripheral neuropathy .  Has been discussed in details. Informal consent has been obtained and will be documented by nurses in the chart Patient was explained all the side effects of chemotherapy.  And informed consent has been obtained  Patient will be followed from Wednesday onWARDS  by Dr. B as am planning to go on vacation the week Patient expressed understanding and was in agreement with this plan. He also understands that He can call clinic at any time with any questions, concerns, or complaints.    No matching staging information was found for the patient.  Forest Gleason, MD   08/17/2015 1:54 PM

## 2015-08-18 ENCOUNTER — Encounter: Payer: Medicaid Other | Attending: Oncology

## 2015-08-18 ENCOUNTER — Encounter
Admission: AD | Disposition: A | Discharge status: Skilled Nursing Facility | Payer: Self-pay | Source: Ambulatory Visit | Attending: Oncology

## 2015-08-18 DIAGNOSIS — Z590 Homelessness: Secondary | ICD-10-CM

## 2015-08-18 DIAGNOSIS — C779 Secondary and unspecified malignant neoplasm of lymph node, unspecified: Secondary | ICD-10-CM | POA: Insufficient documentation

## 2015-08-18 DIAGNOSIS — C099 Malignant neoplasm of tonsil, unspecified: Secondary | ICD-10-CM | POA: Insufficient documentation

## 2015-08-18 DIAGNOSIS — C7951 Secondary malignant neoplasm of bone: Secondary | ICD-10-CM

## 2015-08-18 HISTORY — PX: PERIPHERAL VASCULAR CATHETERIZATION: SHX172C

## 2015-08-18 LAB — GLUCOSE, CAPILLARY: GLUCOSE-CAPILLARY: 73 mg/dL (ref 65–99)

## 2015-08-18 SURGERY — PORTA CATH INSERTION
Anesthesia: Moderate Sedation

## 2015-08-18 MED ORDER — POTASSIUM CHLORIDE 2 MEQ/ML IV SOLN
Freq: Once | INTRAVENOUS | Status: DC
  Filled 2015-08-18: qty 10

## 2015-08-18 MED ORDER — MIDAZOLAM HCL 5 MG/5ML IJ SOLN
INTRAMUSCULAR | Status: AC
  Filled 2015-08-18: qty 5

## 2015-08-18 MED ORDER — ONDANSETRON HCL 4 MG/2ML IJ SOLN: 4.0000 mg | Freq: Four times a day (QID) | INTRAMUSCULAR | Status: DC | PRN

## 2015-08-18 MED ORDER — HEPARIN (PORCINE) IN NACL 2-0.9 UNIT/ML-% IJ SOLN
INTRAMUSCULAR | Status: AC
  Filled 2015-08-18: qty 500

## 2015-08-18 MED ORDER — DEXTROSE 5 % IV SOLN
1.5000 g | Freq: Once | INTRAVENOUS | Status: AC
  Administered 2015-08-18: 1.5 g via INTRAVENOUS

## 2015-08-18 MED ORDER — DIPHENHYDRAMINE HCL 25 MG PO CAPS
50.0000 mg | ORAL_CAPSULE | Freq: Once | ORAL | Status: AC
  Administered 2015-08-19: 13:00:00 50 mg via ORAL
  Filled 2015-08-18: qty 2

## 2015-08-18 MED ORDER — MIDAZOLAM HCL 2 MG/2ML IJ SOLN
INTRAMUSCULAR | Status: DC | PRN
  Administered 2015-08-18: 2 mg via INTRAVENOUS
  Administered 2015-08-18: 1 mg via INTRAVENOUS

## 2015-08-18 MED ORDER — FLUDEOXYGLUCOSE F - 18 (FDG) INJECTION
12.0300 | Freq: Once | INTRAVENOUS | Status: AC | PRN
  Administered 2015-08-18: 08:00:00 12.03 via INTRAVENOUS

## 2015-08-18 MED ORDER — SODIUM CHLORIDE 0.9 % IV SOLN
65.0000 mg/m2 | Freq: Once | INTRAVENOUS | Status: AC
  Administered 2015-08-19: 16:00:00 117 mg via INTRAVENOUS
  Filled 2015-08-18: qty 117

## 2015-08-18 MED ORDER — PALONOSETRON HCL INJECTION 0.25 MG/5ML
0.2500 mg | Freq: Once | INTRAVENOUS | Status: AC
  Administered 2015-08-19: 13:00:00 0.25 mg via INTRAVENOUS
  Filled 2015-08-18: qty 5

## 2015-08-18 MED ORDER — ENSURE ENLIVE PO LIQD
237.0000 mL | Freq: Two times a day (BID) | ORAL | Status: DC
  Administered 2015-08-19 – 2015-08-28 (×15): 237 mL via ORAL

## 2015-08-18 MED ORDER — LIDOCAINE-EPINEPHRINE (PF) 1 %-1:200000 IJ SOLN
INTRAMUSCULAR | Status: AC
  Filled 2015-08-18: qty 30

## 2015-08-18 MED ORDER — FAMOTIDINE IN NACL 20-0.9 MG/50ML-% IV SOLN
20.0000 mg | Freq: Once | INTRAVENOUS | Status: AC
  Administered 2015-08-19: 14:00:00 20 mg via INTRAVENOUS
  Filled 2015-08-18: qty 50

## 2015-08-18 MED ORDER — FENTANYL CITRATE (PF) 100 MCG/2ML IJ SOLN
INTRAMUSCULAR | Status: DC | PRN
  Administered 2015-08-18 (×2): 50 ug via INTRAVENOUS

## 2015-08-18 MED ORDER — SODIUM CHLORIDE 0.9 % IV SOLN
Freq: Once | INTRAVENOUS | Status: AC
  Administered 2015-08-19: 13:00:00 via INTRAVENOUS
  Filled 2015-08-18: qty 2
  Filled 2015-08-18: qty 5

## 2015-08-18 MED ORDER — MORPHINE SULFATE (PF) 4 MG/ML IV SOLN
4.0000 mg | Freq: Once | INTRAVENOUS | Status: AC
  Administered 2015-08-18: 17:00:00 4 mg via INTRAVENOUS
  Filled 2015-08-18: qty 1

## 2015-08-18 MED ORDER — SODIUM CHLORIDE 0.9 % IV SOLN
2200.0000 mg/m2 | Freq: Once | INTRAVENOUS | Status: AC
  Administered 2015-08-19: 18:00:00 3950 mg via INTRAVENOUS
  Filled 2015-08-18: qty 79

## 2015-08-18 MED ORDER — FENTANYL CITRATE (PF) 100 MCG/2ML IJ SOLN
INTRAMUSCULAR | Status: AC
  Filled 2015-08-18: qty 2

## 2015-08-18 MED ORDER — DOCETAXEL CHEMO INJECTION 160 MG/16ML
65.0000 mg/m2 | Freq: Once | INTRAVENOUS | Status: AC
  Administered 2015-08-19: 14:00:00 120 mg via INTRAVENOUS
  Filled 2015-08-18: qty 12

## 2015-08-18 SURGICAL SUPPLY — 9 items
BAG DECANTER STRL (MISCELLANEOUS) ×3 IMPLANT
DRAPE INCISE IOBAN 66X45 STRL (DRAPES) ×3 IMPLANT
KIT PORT POWER 8FR ISP CVUE (Catheter) ×3 IMPLANT
PACK ANGIOGRAPHY (CUSTOM PROCEDURE TRAY) ×3 IMPLANT
PREP CHG 10.5 TEAL (MISCELLANEOUS) ×3 IMPLANT
SUT MNCRL AB 4-0 PS2 18 (SUTURE) ×3 IMPLANT
SUT PROLENE 0 CT 1 30 (SUTURE) ×3 IMPLANT
SUTURE VIC 3-0 (SUTURE) ×3 IMPLANT
TOWEL OR 17X26 4PK STRL BLUE (TOWEL DISPOSABLE) ×3 IMPLANT
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
## 2015-08-18 NOTE — Op Note (Signed)
OPERATIVE NOTE   PROCEDURE: 1. Placement of a right IJ Infuse-a-Port  PRE-OPERATIVE DIAGNOSIS: Tonsillar carcinoma  POST-OPERATIVE DIAGNOSIS: Same  SURGEON: Katha Cabal M.D.  ANESTHESIA: Conscious sedation combined with 1% lidocaine with epinephrine  ESTIMATED BLOOD LOSS: Minimal   FINDING(S): 1.  Patent vein  SPECIMEN(S): None  INDICATIONS:   Ryan Rush is a 55 y.o. male who presents with tonsillar carcinoma.  DESCRIPTION: After obtaining full informed written consent, the patient was brought back to the special procedure suite and placed in the supine position. The patient's right neck and chest wall are prepped and draped in sterile fashion. Appropriate timeout was called.  Ultrasound is placed in a sterile sleeve, ultrasound is utilized to avoid vascular injury as well as secondary to lack of appropriate landmarks. The right internal jugular vein is identified. It is echolucent and homogeneous as well as easily compressible indicating patency. 1% lidocaine is infiltrated into the soft tissue at the base of the neck as well as on the chest wall.  Under direct ultrasound visualization Seldinger needle is inserted into the right internal jugular vein. J-wire is advanced under fluoroscopic guidance. A small counterincision was created at the wire insertion site. A transverse incision is created 2 fingerbreadths below the scapula and a pocket is fashioned using both blunt and sharp dissection. The pocket is tested for appropriate size with the hub of the Infuse-a-Port. The tunneling device is then used to pull the intravascular portion of the catheter from the pocket to the neck counterincision.  Dilator and peel-away sheath were then inserted over the wire and the wire is removed. Catheter is then advanced into the venous system without difficulty. Peel-away sheath was then removed.  Catheter is then positioned under fluoroscopic guidance at the atrial caval junction. It is  then transected connected to the hub and the hope is slipped into the subcutaneous pocket on the chest wall. The hub was then accessed percutaneously and aspirates easily and flushes well and is flushed with 30 cc of heparinized saline. The pocket incision is then closed in layers using interrupted 3-0 Vicryl for the subcutaneous tissues and 4-0 Monocryl subcuticular for skin closure. Dermabond is applied. The neck counterincision was closed with 4-0 Monocryl subcuticular and Dermabond as well.  The patient tolerated the procedure well and there were no immediate complications.  COMPLICATIONS: None  CONDITION: Unchanged  Katha Cabal M.D. [facility] Office: [phone]   08/18/2015, 5:53 PM

## 2015-08-18 NOTE — OR Nursing (Signed)
15:20 Pt taken to VIR for port insertion, Pt. States he has had nothing to eat today and has no known allergies.

## 2015-08-18 NOTE — Progress Notes (Signed)
[place] @ [facility] [phone]  Fax:([phone]) [place] OB: [birth year]  MR#: [id number]  [id number]  Patient Care Team: Margaretha Sheffield, MD (Otolaryngology)  CHIEF COMPLAINT:  No chief complaint on file.  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 Tonsil cancer ([place]) 146.0 C09.9 0.9 %  sodium chloride infusion     amLODipine (NORVASC) tablet 5 mg     NM PET Image Initial (PI) Skull Base To Thigh     NM PET Image Initial (PI) Skull Base To Thigh     DISCONTINUED: fluorouracil (ADRUCIL) 4,000 mg in sodium chloride 0.9 % 70 mL chemo infusion     DISCONTINUED: fosaprepitant (EMEND) 150 mg, dexamethasone (DECADRON) 12 mg in sodium chloride 0.9 % 145 mL IVPB     DISCONTINUED: palonosetron (ALOXI) injection 0.25 mg     DISCONTINUED: dextrose 5 % and 0.45% NaCl 1,000 mL with potassium chloride 20 mEq, magnesium sulfate 12 mEq infusion     DISCONTINUED: CISplatin (PLATINOL) 135 mg in sodium chloride 0.9 % 500 mL chemo infusion     DISCONTINUED: DOCEtaxel (TAXOTERE) 120 mg in dextrose 5 % 250 mL chemo infusion     DISCONTINUED: dextrose 5 % and 0.45% NaCl 1,000 mL with potassium chloride 20 mEq, magnesium sulfate 12 mEq infusion  2. Metastasis to lymph nodes ([facility]) 196.9 C77.9 NM PET Image Initial (PI) Skull Base To Thigh     NM PET Image Initial (PI) Skull Base To Thigh     DISCONTINUED: fluorouracil (ADRUCIL) 4,000 mg in sodium chloride 0.9 % 70 mL chemo infusion     DISCONTINUED: fosaprepitant (EMEND) 150 mg, dexamethasone (DECADRON) 12 mg in sodium chloride 0.9 % 145 mL IVPB     DISCONTINUED: palonosetron (ALOXI) injection 0.25 mg     DISCONTINUED: dextrose 5 % and 0.45% NaCl 1,000 mL with potassium chloride 20  mEq, magnesium sulfate 12 mEq infusion     DISCONTINUED: CISplatin (PLATINOL) 135 mg in sodium chloride 0.9 % 500 mL chemo infusion     DISCONTINUED: DOCEtaxel (TAXOTERE) 120 mg in dextrose 5 % 250 mL chemo infusion     DISCONTINUED: dextrose 5 % and 0.45% NaCl 1,000 mL with potassium chloride 20 mEq, magnesium sulfate 12 mEq infusion      No history exists.    Oncology Flowsheet 08/03/2015  ondansetron (ZOFRAN) IV 4 mg    INTERVAL HISTORY:55 year old gentleman was noticed swelling on the right side of the neck over 2 of last 3-6 months.  Patient is unfortunately home less very poor access to medical care went to emergency room and swelling but still bigger.  And has some difficulty swallowing. Evaluated by ENT physician and needle biopsy of the tonsillar mass was done CT scan of soft tissue revealed extensive bilateral disease with soft tissue mass Needle biopsy was necrotic tissue so another biopsy being planned.  Patient denies any history of difficulty swallowing or any significant weight loss.  Has pain but presently Tylenol is helping.  Patient lives in [place] and has no family.  He smokes but according to him he does not drink  Because of social situation where patient did not have a homeless to stay and car to drive patient was admitted in the hospital for further  workup as well as treatment consideration  PET scan has been done today which shows extensive disease in the bone and liver  Patient was supposed to get a port placement today REVIEW OF SYSTEMS:   Gen. status alert oriented individual in performance status of 1 HEENT: Does describe a bowel swelling on the right side of the neck gradually getting worse.  Increasing difficulty in swallowing. Lungs: Patient is chronic smoker.  Shortness of breath on exertion Cardiac: No chest pain no history of hypertension GI: No nausea no vomiting no diarrhea GU: No dysuria hematuria Skin: No rash. Neurological system no  tingling.  No numbness.   Lower  extremities no swelling All other 12 systems have been reviewed  As per HPI. Otherwise, a complete review of systems is negatve.  PAST MEDICAL HISTORY: No significant past medical history   PAST SURGICAL HISTORY:   No Significant past surgical history  FAMILY HISTORY There is family history of breast cancer but patient does not remember any specific       ADVANCED DIRECTIVES:  Patient does not have any living will or healthcare power of attorney.  Information was given .  Available resources had been discussed.  We will follow-up on subsequent appointments regarding this issue  HEALTH MAINTENANCE: Social History  Substance Use Topics  . Smoking status: Current Every Day Smoker -- 1.00 packs/day    Types: Cigarettes  . Smokeless tobacco: None  . Alcohol Use: No   Patient is homeless.  He has very poor access to the medical care. 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 status: Patient is alert oriented not any acute distress. HEENT: There is massive tonsillar mass pushing the uvula on other side from the right side mass arising from PS to be from tonsils. There are significantly enlarged palpable lymph node on both side of the neck Lymphatic system: Bilateral cervical enlargement of lymph nodes no other palpable lymph nodes Lungs: Emphysematous chest diminished air entry on both sides GI: Abdomen is soft liver and spleen not palpable GU: No dysuria or hematuria Skin: No rash Neurological system: Higher functions within normal limits.  Cranial nerves are intact.  No motor or sensory deficit  Musculoskeletal system within normal limit. All other systems have been reviewed  Filed Vitals:   08/18/15 0943 08/18/15 1412  BP: 149/74 153/81  Pulse: 70 72  Temp: 97.5 F (36.4 C) 97.9 F (36.6 C)  Resp: 20 20     Body mass index is 21.55 kg/(m^2).    ECOG FS:1 -  Symptomatic but completely ambulatory  LAB RESULTS:  Admission on 08/17/2015  Component Date Value Ref Range Status  . MRSA, PCR 08/17/2015 NEGATIVE  NEGATIVE Final  . Staphylococcus aureus 08/17/2015 NEGATIVE  NEGATIVE Final   Comment:        The Xpert SA Assay (FDA approved for NASAL specimens in patients over 53 years of age), is one component of a comprehensive surveillance program.  Test performance has been validated by [facility] for patients greater than or equal to 48 year old. It is not intended to diagnose infection nor to guide or monitor treatment.   . Glucose-Capillary 08/18/2015 73  65 - 99 mg/dL Final     STUDIES: Dg Chest 2 View  08/03/2015  CLINICAL DATA:  Swelling to RIGHT-side of neck and feeling tired for several days, cough, hematemesis EXAM: CHEST  2 VIEW COMPARISON:  None FINDINGS:  Normal heart size, mediastinal contours, and pulmonary vascularity. Lungs clear. No pneumothorax. Bones unremarkable. IMPRESSION: Normal exam. Electronically Signed   By: Lavonia Dana M.D.   On: 08/03/2015 16:00   Ct Soft Tissue Neck W Contrast  08/03/2015  CLINICAL DATA:  Right-sided neck pain and swelling for 3 days. EXAM: CT NECK WITH CONTRAST TECHNIQUE: Multidetector CT imaging of the neck was performed using the standard protocol following the bolus administration of intravenous contrast. CONTRAST:  49mL OMNIPAQUE IOHEXOL 300 MG/ML  SOLN COMPARISON:  None. FINDINGS: Pharynx and larynx: There is necrotic mass in the right oropharynx with debris in the cavity. This narrows the oropharynx moderately. The mass is difficult to measure, but the ulcer base measures at least 22 mm in diameter (at least T2). No invasive features seen in the intrinsic tongue muscles or neighboring bone. There is bilateral cavitary cervical lymph nodes on the right levels 2 and 3 and on the left level 2 at least. The right-sided adenopathy in total measures up to 43 mm in maximal axial dimension and has  coalesced due to extensive extracapsular disease. (at least N2c). There is narrowing of the right internal jugular vein without thrombosis. High-grade proximal right ICA stenosis, but likely atherosclerotic. No definitive carotid encasement. There is extensive submucosal edema in the pharynx and supraglottic larynx with prevertebral fluid that is non loculated rim enhancing. Given the acute symptomatology and absent treatment history this may reflect superimposed infection. Salivary glands: No primary mass is seen Thyroid: Negative Vascular: Right ICA stenosis as above. Right carotid sheath distortion by a malignant adenopathy Limited intracranial: Negative Mastoids and visualized paranasal sinuses: Clear Skeleton: No malignant lytic or blastic lesions are seen. Advanced periodontal disease in the right posterior maxilla. Upper chest: No apical nodules. These results were called by telephone at the time of interpretation on 08/03/2015 at 4:31 pm to Dr. Eula Listen , who verbally acknowledged these results. IMPRESSION: 1. 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 Bilateral cervical malignant lymphadenopathy. Further staging considerations above. 2. Pharynx and supraglottic larynx submucosal edema which may reflect a superimposed acute infection and cause of the acute symptomatology. 3. Advanced atherosclerosis with high-grade right ICA stenosis. Electronically Signed   By: Monte Fantasia M.D.   On: 08/03/2015 16:41   Nm Pet Image Initial (pi) Skull Base To Thigh  08/18/2015  CLINICAL DATA:  Initial treatment strategy for tonsil cancer. EXAM: NUCLEAR MEDICINE PET SKULL BASE TO THIGH TECHNIQUE: 12.03 mCi F-18 FDG was injected intravenously. Full-ring PET imaging was performed from the skull base to thigh after the radiotracer. CT data was obtained and used for attenuation correction and anatomic localization. FASTING BLOOD GLUCOSE:  Value: 73 mg/dl COMPARISON:  08/03/2015  FINDINGS: NECK There is abnormal asymmetric hyper metabolism within the right side of base of tongue extending into the right tonsillar region. This has an SUV max equal to 7.73. Enlarged and hypermetabolic bilateral cervical lymph nodes are identified. Left-sided level-II node measures 1.9 cm and has an SUV max equal to 10.68. There is a right-sided cervical node which has an SUV max measuring 2.9 cm within SUV max equal to 10.36. CHEST No hypermetabolic mediastinal or hilar nodes. Small nodule within the superior segment of right lower lobe measures 5 mm and is too small to characterize, image 103 of series 3. ABDOMEN/PELVIS Multifocal hypermetabolic liver lesions are identified. Index lesion within the right lobe of liver measures 1.3 cm and has an SUV max equal to 6.3, image 138 of series 3.  No abnormal hypermetabolic activity within the pancreas, adrenal glands, or spleen. Peripancreatic lymph node measures 1.5 cm and has an SUV max equal to 4.5. SKELETON Multifocal hypermetabolic bone metastases identified. Index lesion within the T11 vertebra measures 6 mm and has an SUV max equal to 5.9. There is stent index lesion within the proximal femur which measures 1.7 cm and has an SUV max equal to 6.19. IMPRESSION: 1. There is intense FDG uptake associated with the right base of tongue and right tonsillar lesion. 2. Hypermetabolic bilateral cervical adenopathy and upper abdominal adenopathy. 3. Multifocal hypermetabolic liver metastasis and bone metastases. Electronically Signed   By: Kerby Moors M.D.   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  Social worker will be contacted.  Waiting for port to be placed for further chemotherapy orders  Port can be placed during    NEXT   hospitalization.    Patient expressed understanding and was in agreement with this plan. He also understands that He can call clinic at any time with any questions, concerns, or complaints.    No matching staging information was found for the patient.  Forest Gleason, MD   08/18/2015 4:14 PM

## 2015-08-18 NOTE — Progress Notes (Addendum)
Initial Nutrition Assessment   INTERVENTION:   Coordination of Care: await diet progression as medically able s/p procedure Meals and Snacks: Recommend Regular diet as pt with no h/o DM and to optimize nutritional intake. If pt with difficulty swallowing however pt may need Dysphagia III diet order and/or possible SLP evaluation if at risk for aspiration. Medical Food Supplement Therapy: recommend Ensure Enlive po BID, each supplement provides 350 kcal and 20 grams of protein   NUTRITION DIAGNOSIS:   Predicted suboptimal nutrient intake related to social / environmental circumstances as evidenced by per patient/family report.  GOAL:   Patient will meet greater than or equal to 90% of their needs  MONITOR:   PO intake, Supplement acceptance, Diet advancement, Labs, Weight trends, I & O's  REASON FOR ASSESSMENT:   Consult, Malnutrition Screening Tool Assessment of nutrition requirement/status  ASSESSMENT:    Pt admitted with squamous cell carcinoma of right tonsil with mets to b/l lymph nodes.  Past Medical History  Diagnosis Date  . Cancer ([place])   . Hypertension     Diet Order:  Diet NPO time specified    Current Nutrition: Pt currently NPO for procedure, reports eating very well yesterday after being admitted.  Food/Nutrition-Related History: Pt reports very good appetite PTA. Pt reports eating breakfast in the morning, mostly pancakes. When asked about what else pt eats during the day, he reports 'whatever I can, usually sandwiches.' RD notes pt homeless for the past year.  Pt reports having a big appetite and eats when he gets hungry. Pt reports eating all sorts of different foods. Pt somewhat vague even with more questioning regarding dietary recall on visit. Pt reports 'he is just not picky.' RD asked pt about his swallowing to which pt reports difficulty when he was having hematemesis on admission, but none now and not so much PTA previously.    Scheduled  Medications:  . amLODipine  5 mg Oral Daily  .  ceFAZolin (ANCEF) IV  1 g Intravenous On Call    Continuous Medications:  . sodium chloride 125 mL/hr at 08/18/15 0626     Electrolyte/Renal Profile and Glucose Profile:  No results for input(s): NA, K, CL, CO2, BUN, CREATININE, CALCIUM, MG, PHOS, GLUCOSE in the last 168 hours.  RD notes Na 130 and K 3.2 with Albumin of 3.8 on 08/10/2015 at [place]: No results for input(s): ALBUMIN in the last 168 hours.  Gastrointestinal Profile: Last BM:  08/18/2015   Nutrition-Focused Physical Exam Findings: Nutrition-Focused physical exam completed. Findings are WDL for fat depletion, muscle depletion, and edema.    Weight Change: Pt reports UBW of 160-165lbs but pt does not know when he last weighed his UBW. Per chart review pt weight of 151lbs on 08/10/2015 at [facility].    Height:   Ht Readings from Last 1 Encounters:  08/17/15 5\' 9"  (1.753 m)    Weight:   Wt Readings from Last 1 Encounters:  08/17/15 146 lb (66.225 kg)   Wt Readings from Last 10 Encounters:  08/17/15 146 lb (66.225 kg)  08/17/15 148 lb 2.4 oz (67.2 kg)  08/10/15 151 lb 6 oz (68.663 kg)  08/03/15 150 lb (68.04 kg)     BMI:  Body mass index is 21.55 kg/(m^2).  Estimated Nutritional Needs:   Kcal:  BEE: 1475kcals, TEE: (IF 1.1-1.3)(AF 1.3) 1947-2305kcals  Protein:  73-86g protein (1.1-1.3g/kg)  Fluid:  1650-194mL of fluid (25-5mL/kg)  EDUCATION NEEDS:   No education needs identified at this  time   HIGH Care Level   Dwyane Luo, RD, LDN Pager [phone] Weekend/On-Call Pager [phone]
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
## 2015-08-18 NOTE — Plan of Care (Signed)
Problem: Education: Goal: Knowledge of the prescribed therapeutic regimen will improve Outcome: Progressing Pt went this pm for port placed tol well. Rt chest port.  For chemo tomorrow.

## 2015-08-19 ENCOUNTER — Encounter: Payer: Self-pay | Admitting: Vascular Surgery

## 2015-08-19 DIAGNOSIS — D6959 Other secondary thrombocytopenia: Secondary | ICD-10-CM

## 2015-08-19 MED ORDER — POTASSIUM CHLORIDE 20 MEQ PO PACK
40.0000 meq | PACK | Freq: Two times a day (BID) | ORAL | Status: DC
  Administered 2015-08-19 (×2): 40 meq via ORAL
  Filled 2015-08-19 (×2): qty 2

## 2015-08-19 MED ORDER — PENTAFLUOROPROP-TETRAFLUOROETH EX AERO
INHALATION_SPRAY | Freq: Once | CUTANEOUS | Status: AC
  Administered 2015-08-19: 10:00:00 via TOPICAL
  Filled 2015-08-19: qty 30

## 2015-08-19 MED ORDER — PROCHLORPERAZINE EDISYLATE 5 MG/ML IJ SOLN
10.0000 mg | Freq: Four times a day (QID) | INTRAMUSCULAR | Status: DC | PRN
  Filled 2015-08-19: qty 2

## 2015-08-19 MED ORDER — POTASSIUM CHLORIDE 2 MEQ/ML IV SOLN
Freq: Once | INTRAVENOUS | Status: AC
  Administered 2015-08-19: 14:00:00 via INTRAVENOUS
  Filled 2015-08-19: qty 10

## 2015-08-19 MED ORDER — SODIUM CHLORIDE 0.9% FLUSH: 10.0000 mL | INTRAVENOUS | Status: DC | PRN

## 2015-08-19 MED ORDER — HEPARIN SOD (PORK) LOCK FLUSH 100 UNIT/ML IV SOLN: 500.0000 [IU] | INTRAVENOUS | Status: DC | PRN

## 2015-08-19 NOTE — Plan of Care (Signed)
Problem: Education: Goal: Knowledge of the prescribed therapeutic regimen will improve Outcome: Progressing Pt. Educated on Taxotere, Cisplatin and 5Fu chemotherapy infusions prior to initiating treatment.
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
## 2015-08-20 LAB — COMPREHENSIVE METABOLIC PANEL
ALT: 13 U/L — ABNORMAL LOW (ref 17–63)
AST: 22 U/L (ref 15–41)
Albumin: 3.3 g/dL — ABNORMAL LOW (ref 3.5–5.0)
Alkaline Phosphatase: 75 U/L (ref 38–126)
Anion gap: 7 (ref 5–15)
BILIRUBIN TOTAL: 0.6 mg/dL (ref 0.3–1.2)
BUN: 13 mg/dL (ref 6–20)
CHLORIDE: 103 mmol/L (ref 101–111)
CO2: 20 mmol/L — ABNORMAL LOW (ref 22–32)
CREATININE: 0.72 mg/dL (ref 0.61–1.24)
Calcium: 8.4 mg/dL — ABNORMAL LOW (ref 8.9–10.3)
Glucose, Bld: 150 mg/dL — ABNORMAL HIGH (ref 65–99)
POTASSIUM: 4.2 mmol/L (ref 3.5–5.1)
Sodium: 130 mmol/L — ABNORMAL LOW (ref 135–145)
TOTAL PROTEIN: 7.2 g/dL (ref 6.5–8.1)

## 2015-08-20 MED ORDER — CALCIUM CARBONATE ANTACID 500 MG PO CHEW
2.0000 | CHEWABLE_TABLET | Freq: Four times a day (QID) | ORAL | Status: DC | PRN
  Administered 2015-08-20 – 2015-08-21 (×2): 400 mg via ORAL
  Filled 2015-08-20 (×2): qty 2

## 2015-08-20 NOTE — Progress Notes (Signed)
Care of patient assumed from 1900 - 2300. Patient denies any discomfort. All IV fluids and chemo infusing without problems. Assessment as documented. No distress noted.
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
## 2015-08-20 NOTE — Plan of Care (Signed)
Problem: Education: Goal: Knowledge of the prescribed therapeutic regimen will improve Outcome: Progressing 5FU continues infusing without difficulty, pt with complaints of pain in hip relieved by PO pain meds, pt voices any needs

## 2015-08-21 DIAGNOSIS — R12 Heartburn: Secondary | ICD-10-CM

## 2015-08-21 LAB — COMPREHENSIVE METABOLIC PANEL
ALT: 32 U/L (ref 17–63)
AST: 52 U/L — AB (ref 15–41)
Albumin: 3.4 g/dL — ABNORMAL LOW (ref 3.5–5.0)
Alkaline Phosphatase: 61 U/L (ref 38–126)
Anion gap: 7 (ref 5–15)
BUN: 15 mg/dL (ref 6–20)
CALCIUM: 8.3 mg/dL — AB (ref 8.9–10.3)
CO2: 21 mmol/L — AB (ref 22–32)
CREATININE: 0.7 mg/dL (ref 0.61–1.24)
Chloride: 101 mmol/L (ref 101–111)
GFR calc non Af Amer: >60 mL/min (ref >60)
Glucose, Bld: 103 mg/dL — ABNORMAL HIGH (ref 65–99)
Potassium: 4 mmol/L (ref 3.5–5.1)
Sodium: 129 mmol/L — ABNORMAL LOW (ref 135–145)
Total Bilirubin: 0.4 mg/dL (ref 0.3–1.2)
Total Protein: 7.1 g/dL (ref 6.5–8.1)

## 2015-08-21 MED ORDER — PANTOPRAZOLE SODIUM 40 MG PO TBEC
40.0000 mg | DELAYED_RELEASE_TABLET | Freq: Every day | ORAL | Status: DC
  Administered 2015-08-21 – 2015-08-28 (×8): 40 mg via ORAL
  Filled 2015-08-21 (×8): qty 1

## 2015-08-21 MED ORDER — ENOXAPARIN SODIUM 40 MG/0.4ML ~~LOC~~ SOLN
40.0000 mg | SUBCUTANEOUS | Status: DC
  Administered 2015-08-21 – 2015-08-28 (×8): 40 mg via SUBCUTANEOUS
  Filled 2015-08-21 (×8): qty 0.4

## 2015-08-21 MED ORDER — SUCRALFATE 1 G PO TABS
1.0000 g | ORAL_TABLET | Freq: Three times a day (TID) | ORAL | Status: DC
  Administered 2015-08-21 – 2015-08-28 (×27): 1 g via ORAL
  Filled 2015-08-21 (×27): qty 1

## 2015-08-21 NOTE — Progress Notes (Signed)
Nutrition Follow-up   INTERVENTION:   Meals and Snacks: Cater to patient preferences. Recommend liberalizing to Regular diet order as pt not diabetic. Medical Food Supplement Therapy: Continue Ensure Enlive as ordered Coordination of Care: will recommend collecting new weight   NUTRITION DIAGNOSIS:   Predicted suboptimal nutrient intake related to social / environmental circumstances as evidenced by per patient/family report.  GOAL:   Patient will meet greater than or equal to 90% of their needs  MONITOR:   PO intake, Supplement acceptance, Diet advancement, Labs, Weight trends, I & O's  REASON FOR ASSESSMENT:   Consult, Malnutrition Screening Tool Assessment of nutrition requirement/status  ASSESSMENT:    Pt s/p port placement and initiation of chemotherapy on 3/15.  Diet Order:  Diet Carb Modified Fluid consistency:: Thin; Room service appropriate?: Yes    Current Nutrition:  Pt with 100% of meals eaten with a good appetite currently. Pt c/o heartburn this am.   Gastrointestinal Profile: Last BM: 08/19/2015   Scheduled Medications:  . amLODipine  5 mg Oral Daily  . enoxaparin (LOVENOX) injection  40 mg Subcutaneous Q24H  . feeding supplement (ENSURE ENLIVE)  237 mL Oral BID WC  . FLUOROURACIL (ADRUCIL) CHEMO infusion For Inpatient Use  2,200 mg/m2 Intravenous Once  . pantoprazole  40 mg Oral Daily    Continuous Medications:  . sodium chloride 125 mL/hr at 08/21/15 0741     Electrolyte/Renal Profile and Glucose Profile:   Recent Labs Lab 08/20/15 0456 08/21/15 0502  NA 130* 129*  K 4.2 4.0  CL 103 101  CO2 20* 21*  BUN 13 15  CREATININE 0.72 0.70  CALCIUM 8.4* 8.3*  GLUCOSE 150* 103*   Protein Profile:  Recent Labs Lab 08/20/15 0456 08/21/15 0502  ALBUMIN 3.3* 3.4*     Weight Trend since Admission: Filed Weights   08/17/15 1137  Weight: 146 lb (66.225 kg)    BMI:  Body mass index is 21.55 kg/(m^2).  Estimated Nutritional Needs:    Kcal:  BEE: 1475kcals, TEE: (IF 1.1-1.3)(AF 1.3) 1947-2305kcals  Protein:  73-86g protein (1.1-1.3g/kg)  Fluid:  1650-1946mL of fluid (25-72mL/kg)  EDUCATION NEEDS:   No education needs identified at this time   Ewing, RD, LDN Pager [phone] Weekend/On-Call Pager [phone]

## 2015-08-21 NOTE — Clinical Social Work Note (Signed)
CSW staffed case with Asst Director of Pharmacist, community. Pt is eligible for a Letter of Guarantee for SNF placement due to medically concerns. CSW confirmed with Development worker, community, Amy, that a Medicaid has been completed, and will be filed next week. Pt will need to apply for disability, which he is currently attempting to do. CSW contacted a [facility], as they have accepted LOGs in the past. If pt has transportation to and from [facility] for treatments, they are able to consider. CSW spoke with Patient Services Navigator, and as long as pt is local, transportation will likely be scheduled through [facility]. CSW will initiate a bed search for [facility]. Pt is aware and agreeable to plan. CSW will continue to follow.   Darden Dates, MSW, LCSW  Clinical Social Worker  [phone]

## 2015-08-21 NOTE — Progress Notes (Signed)
Ryan Rush   DOB:1961-05-13   [id number]    Subjective: Pt started chemotherapy on 03/15. No nausea and vomiting; Patient complains of heart burn/indigestion. Appetite okay.  No fevers. No chills.   ROS:  No diarrhea. No constipation.  Objective:  Filed Vitals:   08/21/15 0549 08/21/15 0739  BP: 152/82 137/67  Pulse: 75 68  Temp: 97.7 F (36.5 C)   Resp: 18      Intake/Output Summary (Last 24 hours) at 08/21/15 0746 Last data filed at 08/21/15 0032  Gross per 24 hour  Intake    480 ml  Output   1100 ml  Net   -620 ml    GENERAL:alert, no distress and comfortable;  Is alone. 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  NECK:  Right neck LN/ mass felt 4cm in size.  LUNGS: clear to auscultation and percussion with normal breathing effort HEART: regular rate & rhythm and no murmurs and no lower extremity edema ABDOMEN:abdomen soft, non-tender and normal bowel sounds Musculoskeletal:no cyanosis of digits and no clubbing  NEURO: alert & oriented x 3 with fluent speech, no focal motor/sensory deficits   Labs:  Lab Results  Component Value Date   WBC 8.1 08/10/2015   HGB 14.1 08/10/2015   HCT 41.5 08/10/2015   MCV 92.9 08/10/2015   PLT 246 08/10/2015   NEUTROABS 5.9 08/10/2015    Lab Results  Component Value Date   NA 129* 08/21/2015   K 4.0 08/21/2015   CL 101 08/21/2015   CO2 21* 08/21/2015    Studies:  No results found.  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 Cis-5FU- docetxael  Day #3 today; Patient will need Neulasta post chemotherapy on day #5.   #  Mild hyponatremia- monitor for now   # heart burn- start protonix  # DVT prophylaxis- lovenox 40mg  SQ  # plan DC to assisted living/ social work following after finishing  chemotherapy.      Cammie Sickle, MD 08/21/2015  7:46 AM

## 2015-08-22 DIAGNOSIS — E871 Hypo-osmolality and hyponatremia: Secondary | ICD-10-CM

## 2015-08-22 DIAGNOSIS — E876 Hypokalemia: Secondary | ICD-10-CM

## 2015-08-22 LAB — COMPREHENSIVE METABOLIC PANEL
ALK PHOS: 60 U/L (ref 38–126)
ALT: 51 U/L (ref 17–63)
ANION GAP: 7 (ref 5–15)
AST: 67 U/L — ABNORMAL HIGH (ref 15–41)
Albumin: 3.3 g/dL — ABNORMAL LOW (ref 3.5–5.0)
BILIRUBIN TOTAL: 0.9 mg/dL (ref 0.3–1.2)
BUN: 16 mg/dL (ref 6–20)
CALCIUM: 8.1 mg/dL — AB (ref 8.9–10.3)
CO2: 23 mmol/L (ref 22–32)
Chloride: 103 mmol/L (ref 101–111)
Creatinine, Ser: 0.68 mg/dL (ref 0.61–1.24)
Glucose, Bld: 91 mg/dL (ref 65–99)
Potassium: 3.4 mmol/L — ABNORMAL LOW (ref 3.5–5.1)
SODIUM: 133 mmol/L — AB (ref 135–145)
TOTAL PROTEIN: 6.7 g/dL (ref 6.5–8.1)

## 2015-08-22 MED ORDER — PEGFILGRASTIM INJECTION 6 MG/0.6ML ~~LOC~~
6.0000 mg | PREFILLED_SYRINGE | Freq: Once | SUBCUTANEOUS | Status: AC
  Administered 2015-08-23: 6 mg via SUBCUTANEOUS
  Filled 2015-08-22 (×2): qty 0.6

## 2015-08-22 NOTE — Progress Notes (Signed)
Ryan Rush   DOB:04-16-1961   [id number]    Subjective: Pt started chemotherapy on 03/15. No nausea and vomiting; Patient is tolerating his treatment well and offers no specific complaints today.   ROS:  No diarrhea. No constipation.  Objective:  Filed Vitals:   08/21/15 2110 08/22/15 0501  BP: 149/74 122/73  Pulse: 72 69  Temp: 97.8 F (36.6 C) 98 F (36.7 C)  Resp: 18 18     Intake/Output Summary (Last 24 hours) at 08/22/15 0844 Last data filed at 08/22/15 0500  Gross per 24 hour  Intake    240 ml  Output    400 ml  Net   -160 ml    GENERAL:alert, no distress and comfortable;  Is alone. 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  NECK:  Right neck LN/ mass felt 4cm in size.  LUNGS: clear to auscultation and percussion with normal breathing effort HEART: regular rate & rhythm and no murmurs and no lower extremity edema ABDOMEN:abdomen soft, non-tender and normal bowel sounds Musculoskeletal:no cyanosis of digits and no clubbing  NEURO: alert & oriented x 3 with fluent speech, no focal motor/sensory deficits   Labs:  Lab Results  Component Value Date   WBC 8.1 08/10/2015   HGB 14.1 08/10/2015   HCT 41.5 08/10/2015   MCV 92.9 08/10/2015   PLT 246 08/10/2015   NEUTROABS 5.9 08/10/2015    Lab Results  Component Value Date   NA 133* 08/22/2015   K 3.4* 08/22/2015   CL 103 08/22/2015   CO2 23 08/22/2015    Studies:  No results found.  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 Cis-5FU- docetxael  Day #4 today; Pump will be discontinued at approximately 5:30 today. Patient has Neulasta scheduled for tomorrow.   2. Hyponatremia: Mild, monitor. 3. Hypokalemia: Mild, monitor. 4. Heartburn: Patient does not complain of this today.  Continue Protonix. 5. DVT prophylaxis: Continue Lovenox 40 mg subcutaneous daily. 6. Disposition: Likely discharge on Monday to [facility].  Full code.   Lloyd Huger, MD 08/22/2015  8:44 AM

## 2015-08-23 MED ORDER — DOCUSATE SODIUM 100 MG PO CAPS
100.0000 mg | ORAL_CAPSULE | Freq: Two times a day (BID) | ORAL | Status: DC
  Administered 2015-08-23 – 2015-08-28 (×11): 100 mg via ORAL
  Filled 2015-08-23 (×11): qty 1

## 2015-08-23 NOTE — Progress Notes (Signed)
Ryan Rush   DOB:1960-08-12   [id number]    Subjective: Pt started chemotherapy on 03/15, 5-FU pump was discontinued yesterday afternoon without incident. Patient currently sleeping and resting comfortably. He denies any fevers. He has a good appetite and denies any nausea. Patient tolerated his treatment well and offers no specific complaints today.   ROS:  As per HPI. Otherwise, a complete review of systems is negative.   Objective:  Filed Vitals:   08/22/15 2054 08/23/15 0508  BP: 152/76 137/75  Pulse: 85 85  Temp: 98.3 F (36.8 C) 98.5 F (36.9 C)  Resp: 18 18     Intake/Output Summary (Last 24 hours) at 08/23/15 0748 Last data filed at 08/23/15 0427  Gross per 24 hour  Intake 3161.25 ml  Output   1170 ml  Net 1991.25 ml    GENERAL:alert, no distress and comfortable;  Is alone. 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  NECK:  Right neck LN/ mass felt 4cm in size.  LUNGS: clear to auscultation and percussion with normal breathing effort HEART: regular rate & rhythm and no murmurs and no lower extremity edema ABDOMEN:abdomen soft, non-tender and normal bowel sounds Musculoskeletal:no cyanosis of digits and no clubbing  NEURO: alert & oriented x 3 with fluent speech, no focal motor/sensory deficits   Labs:  Lab Results  Component Value Date   WBC 8.1 08/10/2015   HGB 14.1 08/10/2015   HCT 41.5 08/10/2015   MCV 92.9 08/10/2015   PLT 246 08/10/2015   NEUTROABS 5.9 08/10/2015    Lab Results  Component Value Date   NA 133* 08/22/2015   K 3.4* 08/22/2015   CL 103 08/22/2015   CO2 23 08/22/2015    Studies:  No results found.  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 Cis-5FU-taxotere completed yesterday.  Patient has Neulasta  scheduled for this afternoon.  2. Hyponatremia: Mild, monitor. 3. Hypokalemia: Mild, monitor. 4. Heartburn: Patient does not complain of this today. Continue Protonix. 5. DVT prophylaxis: Continue Lovenox 40 mg subcutaneous daily. 6. Disposition: Likely discharge on Monday to [facility].  Full code.   Lloyd Huger, MD 08/23/2015  7:48 AM

## 2015-08-24 LAB — CBC WITH DIFFERENTIAL/PLATELET
Basophils Absolute: 0.1 10*3/uL (ref 0–0.1)
Basophils Relative: 1 %
EOS PCT: 1 %
Eosinophils Absolute: 0.1 10*3/uL (ref 0–0.7)
HEMATOCRIT: 37.4 % — AB (ref 40.0–52.0)
Hemoglobin: 13 g/dL (ref 13.0–18.0)
LYMPHS ABS: 0.6 10*3/uL — AB (ref 1.0–3.6)
LYMPHS PCT: 7 %
MCH: 31.5 pg (ref 26.0–34.0)
MCHC: 34.7 g/dL (ref 32.0–36.0)
MCV: 90.7 fL (ref 80.0–100.0)
MONO ABS: 0.1 10*3/uL — AB (ref 0.2–1.0)
Monocytes Relative: 1 %
NEUTROS ABS: 8.2 10*3/uL — AB (ref 1.4–6.5)
Neutrophils Relative %: 90 %
PLATELETS: 115 10*3/uL — AB (ref 150–440)
RBC: 4.13 MIL/uL — ABNORMAL LOW (ref 4.40–5.90)
RDW: 13.1 % (ref 11.5–14.5)
WBC: 9 10*3/uL (ref 3.8–10.6)

## 2015-08-24 LAB — BASIC METABOLIC PANEL
Anion gap: 4 — ABNORMAL LOW (ref 5–15)
BUN: 13 mg/dL (ref 6–20)
CO2: 26 mmol/L (ref 22–32)
Calcium: 8.6 mg/dL — ABNORMAL LOW (ref 8.9–10.3)
Chloride: 103 mmol/L (ref 101–111)
Creatinine, Ser: 0.59 mg/dL — ABNORMAL LOW (ref 0.61–1.24)
GFR calc Af Amer: >60 mL/min (ref >60)
GLUCOSE: 90 mg/dL (ref 65–99)
POTASSIUM: 3.5 mmol/L (ref 3.5–5.1)
Sodium: 133 mmol/L — ABNORMAL LOW (ref 135–145)

## 2015-08-24 LAB — GLUCOSE, CAPILLARY: Glucose-Capillary: 81 mg/dL (ref 65–99)

## 2015-08-24 MED ORDER — ENSURE ENLIVE PO LIQD: 237.0000 mL | Freq: Two times a day (BID) | ORAL | Status: DC

## 2015-08-24 MED ORDER — LOPERAMIDE HCL 2 MG PO CAPS: 2.0000 mg | ORAL_CAPSULE | ORAL | Status: DC | PRN

## 2015-08-24 MED ORDER — MORPHINE SULFATE ER 30 MG PO TBCR: 30.0000 mg | EXTENDED_RELEASE_TABLET | Freq: Two times a day (BID) | ORAL | Status: DC

## 2015-08-24 MED ORDER — MORPHINE SULFATE ER 30 MG PO TBCR
30.0000 mg | EXTENDED_RELEASE_TABLET | Freq: Two times a day (BID) | ORAL | Status: DC
Start: 2015-08-24 — End: 2015-08-28
  Administered 2015-08-24 – 2015-08-28 (×9): 30 mg via ORAL
  Filled 2015-08-24 (×9): qty 1

## 2015-08-24 MED ORDER — HYDROCODONE-ACETAMINOPHEN 10-325 MG PO TABS: 1.0000 | ORAL_TABLET | Freq: Three times a day (TID) | ORAL | Status: DC | PRN

## 2015-08-24 MED ORDER — PANTOPRAZOLE SODIUM 40 MG PO TBEC: 40.0000 mg | DELAYED_RELEASE_TABLET | Freq: Every day | ORAL | Status: DC

## 2015-08-24 MED ORDER — ONDANSETRON HCL 8 MG PO TABS: 8.0000 mg | ORAL_TABLET | Freq: Three times a day (TID) | ORAL | Status: DC | PRN

## 2015-08-24 MED ORDER — AMLODIPINE BESYLATE 5 MG PO TABS: 5.0000 mg | ORAL_TABLET | Freq: Every day | ORAL | Status: DC

## 2015-08-24 MED ORDER — PROCHLORPERAZINE MALEATE 10 MG PO TABS: 10.0000 mg | ORAL_TABLET | Freq: Four times a day (QID) | ORAL | Status: DC | PRN

## 2015-08-24 NOTE — Progress Notes (Signed)
.  Ryan Rush   DOB:03-24-1961   [id number]    Subjective: Pt  Finished chemotherapy on 03/18; s/p neulasta on 3/19th.  No nausea and vomiting; Patient heart burn/indigestion- improved. However c/o worsening pain in left hip. On norco- 2 tablets every 6 hours as needed.     Appetite okay.  No fevers. No chills.   ROS:  No diarrhea. Positive for constipation.   Objective:  Filed Vitals:   08/23/15 2123 08/24/15 0527  BP: 159/78 155/76  Pulse: 86 88  Temp: 98.2 F (36.8 C) 98 F (36.7 C)  Resp: 18 18     Intake/Output Summary (Last 24 hours) at 08/24/15 1313 Last data filed at 08/24/15 0912  Gross per 24 hour  Intake   1860 ml  Output      2 ml  Net   1858 ml    GENERAL:alert, no distress and comfortable;  Is alone. 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  NECK:  Right neck LN/ mass felt 4cm in size.  LUNGS: clear to auscultation and percussion with normal breathing effort HEART: regular rate & rhythm and no murmurs and no lower extremity edema ABDOMEN:abdomen soft, non-tender and normal bowel sounds Musculoskeletal:no cyanosis of digits and no clubbing  NEURO: alert & oriented x 3 with fluent speech, no focal motor/sensory deficits   Labs:  Lab Results  Component Value Date   WBC 9.0 08/24/2015   HGB 13.0 08/24/2015   HCT 37.4* 08/24/2015   MCV 90.7 08/24/2015   PLT 115* 08/24/2015   NEUTROABS 8.2* 08/24/2015    Lab Results  Component Value Date   NA 133* 08/24/2015   K 3.5 08/24/2015   CL 103 08/24/2015   CO2 26 08/24/2015    Studies:  No results found.  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 Cis-5FU- docetxael  Finished chemo on 3/19th.  Patients/p  Neulasta post on march 19th.    #  Left hip pain- on norco 2  every 6 hours; add MS contin 30 mg BID.   # DVT prophylaxis- lovenox 40mg  SQ  # Awaiting disposition/placement. Spoke To social work.    Cammie Sickle, MD 08/24/2015  1:13 PM

## 2015-08-24 NOTE — Discharge Instructions (Addendum)
Chemotherapy Chemotherapy is the use of medicines to stop or slow the growth of cancer cells. Depending on the type and stage of your cancer, you may have chemotherapy to:  Cure your cancer.  Slow the progression of your cancer.  Ease your cancer symptoms.  Improve the benefits of radiation treatment.  Shrink a tumor before surgery.  Rid the body of cancer cells that remain after a tumor is surgically removed. HOW IS CHEMOTHERAPY GIVEN? Chemotherapy may be given:  By mouth in liquid or pill form.  Through a thin tube that is inserted into a vein or artery.  By getting a shot.  By rubbing a cream or ointment on your skin.  Through liquids that are placed directly into various areas of the body, such as the abdomen, chest, or bladder. HOW OFTEN IS CHEMOTHERAPY GIVEN? Chemotherapy may be given continuously over time, or it may be given in cycles. For example, you may take the medicine for one week out of every month. FOR HOW LONG WILL I NEED CHEMOTHERAPY TREATMENTS? The length of treatment depends on many factors, including:  The type of cancer.  Whether the cancer has spread.  How you respond to the chemotherapy.  Whether you develop side effects. Some types of chemotherapy medicine are given only one time. Others are given for months, years, or for life. WHAT SAFETY PRECAUTIONS MUST I TAKE WHILE ON CHEMOTHERAPY? Chemotherapy medicines are very strong. They will be in all of your bodily fluids, including your urine, stool, saliva, sweat, tears, vaginal secretions, and semen. You must carefully follow some safety precautions to prevent harm to others while you are using these medicines. Here are some recommended precautions:  Make sure that people who help care for you wear disposable gloves if they are going to come into contact with any of your bodily fluids. Women who are pregnant or breastfeeding should not handle any of your bodily fluids.  Wash any clothes, towels, and  linens that may have your bodily fluids on them twice in a washing machine using very hot water.  Dispose of adult diapers, tampons, and sanitary napkins by first sealing them in a plastic bag.  Use a condom when having sex for at least 2 weeks after receiving your chemotherapy.  Do not share beverages or food.  Keep your chemotherapy medicines in their original bottles. Keep them in a high, safe location, away from children. Do not expose them to heat or moisture. Do not put them in containers with other types of medicines.  Dispose of all wrappers for your chemotherapy medicines by sealing them in a separate plastic bag.  Do not throw away extra medicine, and do not flush it down the toilet. Take medicine that you are not going to use to your health care provider's office where it can be disposed of properly.  Follow your health care provider's directions for the proper disposal of needles, IV tubing, and other medical supplies that have come into contact with your chemotherapy medicines.  If you are issued a hazardous waste container, make sure you understand the directions for using it.  Wash your hands thoroughly with warm water and soap after using the bathroom. Dry your hands with disposable paper towels.  When using the toilet:  Flush it twice after each use, including after vomiting.  Close the lid of the toilet prior to flushing. This helps to avoid splashing.  Both men and women should sit to use the toilet. This helps avoid splashing. WHAT ARE  THE SIDE EFFECTS OF CHEMOTHERAPY? Side effects depend on a variety of factors, including:  The specific type of chemotherapy medicine used.  The dosage.  How long the medicine is used for.  Your overall health. Some of the side effects you may experience include:  Fatigue and decreased energy.  Decreased appetite.  Changes in your sense of smell or taste.  Nausea.  Vomiting.  Constipation or diarrhea.  Hair  loss.  Increased susceptibility to infection.  Easy bleeding.  Mouth sores.  Burning or tingling in the hands or feet.  Memory problems.   This information is not intended to replace advice given to you by your health care provider. Make sure you discuss any questions you have with your health care provider.   Document Released: 03/20/2007 Document Revised: 06/13/2014 Document Reviewed: 10/29/2013 Elsevier Interactive Patient Education 2016 [place] of the tonsils occurs when cells on the outside of the tonsils become abnormal and start to grow out of control. This usually starts in very thin, flat cells that line the surface of your mouth (squamous cells). Cancer cells can spread and form a mass of cells called a tumor. The cancer may spread deeper into the tonsils, or it may spread to other areas of the body (metastasize). RISK FACTORS The exact cause of cancer of the tonsils is not known. However, some risk factors make this more likely:  Use of tobacco products, including cigarettes, pipes, cigars, smokeless (chewing) tobacco, and snuff. This is the number one risk factor of cancer of the tonsils.  Male gender.  Age of 45 years or older.  Frequent use of alcohol.  Human papillomavirus (HPV) infection.  Poor oral hygiene (not brushing or flossing your teeth regularly). SYMPTOMS  One tonsil that is larger than the other.  A sore throat (on just one side) that does not go away on its own or after receiving treatment.  A sore or lump on a tonsil that does not go away.  Difficulty swallowing or chewing.  Ear pain.  Bad breath.  Bleeding in your mouth.  A lump on your neck.  Difficulty when trying to open your mouth. DIAGNOSIS To diagnose cancer of the tonsils, your caregiver may perform the following exams:  A physical exam of your tonsils for a sore or lump. Your caregiver will also look at other parts of your mouth, throat, and  neck. Your caregiver may use a mirror with a long handle or a thin, flexible tube with a tiny light and camera at the end (fiberscope) to see the back of your mouth.  Removal and exam of a small number of cells (biopsy) from your tonsils or a lump on your neck. The cells are checked for cancerous formations under a microscope.  Imaging exams, such as X-rays of your mouth and neck. The images can show if there is an abnormal mass. If you do have cancer, your caregiver will stage your cancer. Staging provides an idea of how advanced your cancer is. The stage of your cancer depends on how much your cancer has grown and if it has metastasized. The meaning of the stage depends on the type of cancer. For cancer of the tonsils:  Stage I means the cancer is the size of a peanut or smaller. It has not metastasized.  Stage II means the cancer is larger than a peanut, but not larger than a walnut. It has not metastasized.  Stage III means the cancer has grown larger  than a walnut. It may have spread to a lymph node or lymph gland on the same side of your neck as the cancer. (Lymph is a fluid that carries white blood cells all over your body. White blood cells fight infection.)  Stage IV means the cancer has spread to nearby areas. It may have spread heavily into your lymph glands. TREATMENT Treatment for tonsil cancer can vary. It will depend on the stage of your cancer and your overall health. Treatment options may include:  Radiation therapy. This uses waves of nuclear energy to kill cancer cells on your tonsils. It may be used for stage I or stage II cancers.  Surgery. This may be needed if the cancer has spread to your lymph nodes or if it has spread to other parts of your neck. A procedure may also be done to make it easier for you to swallow and talk.  A combination of radiation, drugs that kill cancer cells (chemotherapy), and surgery. This may be used for stage III and stage IV cancers. SEEK  MEDICAL CARE IF:  You have pain in your throat or in your ear.  Your throat is numb.  You notice changes in the way you speak.  You notice changes in the way you swallow.  You notice a lump on your neck. SEEK IMMEDIATE MEDICAL CARE IF:   You have pain that gets worse. This may be pain in your throat or your ear.  You have bleeding in your mouth.  Your throat or neck swells.  You have difficulty swallowing.  You have difficulty breathing.  You have difficulty opening your mouth.  You have pain in your mouth.  You have a fever.   This information is not intended to replace advice given to you by your health care provider. Make sure you discuss any questions you have with your health care provider.   Document Released: 09/07/2010 Document Revised: 08/15/2011 Document Reviewed: 09/07/2010 Elsevier Interactive Patient Education 2016 [employer].  Chemotherapy Chemotherapy is the use of medicines to stop or slow the growth of cancer cells. Depending on the type and stage of your cancer, you may have chemotherapy to:  Cure your cancer.  Slow the progression of your cancer.  Ease your cancer symptoms.  Improve the benefits of radiation treatment.  Shrink a tumor before surgery.  Rid the body of cancer cells that remain after a tumor is surgically removed. HOW IS CHEMOTHERAPY GIVEN? Chemotherapy may be given:  By mouth in liquid or pill form.  Through a thin tube that is inserted into a vein or artery.  By getting a shot.  By rubbing a cream or ointment on your skin.  Through liquids that are placed directly into various areas of the body, such as the abdomen, chest, or bladder. HOW OFTEN IS CHEMOTHERAPY GIVEN? Chemotherapy may be given continuously over time, or it may be given in cycles. For example, you may take the medicine for one week out of every month. FOR HOW LONG WILL I NEED CHEMOTHERAPY TREATMENTS? The length of treatment depends on many factors,  including:  The type of cancer.  Whether the cancer has spread.  How you respond to the chemotherapy.  Whether you develop side effects. Some types of chemotherapy medicine are given only one time. Others are given for months, years, or for life. WHAT SAFETY PRECAUTIONS MUST I TAKE WHILE ON CHEMOTHERAPY? Chemotherapy medicines are very strong. They will be in all of your bodily fluids, including your urine, stool,  saliva, sweat, tears, vaginal secretions, and semen. You must carefully follow some safety precautions to prevent harm to others while you are using these medicines. Here are some recommended precautions:  Make sure that people who help care for you wear disposable gloves if they are going to come into contact with any of your bodily fluids. Women who are pregnant or breastfeeding should not handle any of your bodily fluids.  Wash any clothes, towels, and linens that may have your bodily fluids on them twice in a washing machine using very hot water.  Dispose of adult diapers, tampons, and sanitary napkins by first sealing them in a plastic bag.  Use a condom when having sex for at least 2 weeks after receiving your chemotherapy.  Do not share beverages or food.  Keep your chemotherapy medicines in their original bottles. Keep them in a high, safe location, away from children. Do not expose them to heat or moisture. Do not put them in containers with other types of medicines.  Dispose of all wrappers for your chemotherapy medicines by sealing them in a separate plastic bag.  Do not throw away extra medicine, and do not flush it down the toilet. Take medicine that you are not going to use to your health care provider's office where it can be disposed of properly.  Follow your health care provider's directions for the proper disposal of needles, IV tubing, and other medical supplies that have come into contact with your chemotherapy medicines.  If you are issued a hazardous  waste container, make sure you understand the directions for using it.  Wash your hands thoroughly with warm water and soap after using the bathroom. Dry your hands with disposable paper towels.  When using the toilet:  Flush it twice after each use, including after vomiting.  Close the lid of the toilet prior to flushing. This helps to avoid splashing.  Both men and women should sit to use the toilet. This helps avoid splashing. WHAT ARE THE SIDE EFFECTS OF CHEMOTHERAPY? Side effects depend on a variety of factors, including:  The specific type of chemotherapy medicine used.  The dosage.  How long the medicine is used for.  Your overall health. Some of the side effects you may experience include:  Fatigue and decreased energy.  Decreased appetite.  Changes in your sense of smell or taste.  Nausea.  Vomiting.  Constipation or diarrhea.  Hair loss.  Increased susceptibility to infection.  Easy bleeding.  Mouth sores.  Burning or tingling in the hands or feet.  Memory problems.   This information is not intended to replace advice given to you by your health care provider. Make sure you discuss any questions you have with your health care provider.   Document Released: 03/20/2007 Document Revised: 06/13/2014 Document Reviewed: 10/29/2013 Elsevier Interactive Patient Education [employer].

## 2015-08-24 NOTE — NC FL2 (Signed)
[place] LEVEL OF CARE SCREENING TOOL     IDENTIFICATION  Patient Name: Ryan Rush Birthdate: 01/03/1961 Sex: male Admission Date (Current Location): 08/17/2015  [place] and [place] Number:  Engineering geologist and Address:  [facility], [address], [place], [place] [address]      Provider Number: [id number]  Attending Physician Name and Address:  Forest Gleason, MD  Relative Name and Phone Number:       Current Level of Care: Hospital Recommended Level of Care: [place] Prior Approval Number:    Date Approved/Denied:   PASRR Number: [id number]  Discharge Plan: SNF    Current Diagnoses: Patient Active Problem List   Diagnosis Date Noted  . Cancer ([place]) 08/17/2015  . 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    Orientation RESPIRATION BLADDER Height & Weight     Self, Time, Situation, Place  Normal Continent Weight: 146 lb (66.225 kg) Height:  5\' 9"  (175.3 cm)  BEHAVIORAL SYMPTOMS/MOOD NEUROLOGICAL BOWEL NUTRITION STATUS      Continent Diet (Carb Modified, Thin Liquids)  AMBULATORY STATUS COMMUNICATION OF NEEDS Skin   Limited Assist Verbally Normal                       Personal Care Assistance Level of Assistance  Bathing, Feeding, Dressing Bathing Assistance: Limited assistance Feeding assistance: Independent Dressing Assistance: Limited assistance     Functional Limitations Info  Sight, Hearing, Speech Sight Info: Adequate Hearing Info: Adequate Speech Info: Adequate    SPECIAL CARE FACTORS FREQUENCY                       Contractures      Additional Factors Info  Code Status, Allergies Code Status Info: Full Code Allergies Info: No known allegies           Current Medications (08/24/2015):  This is the current hospital active medication list Current Facility-Administered Medications  Medication Dose Route Frequency Provider Last Rate Last Dose  .  0.9 %  sodium chloride infusion   Intravenous Continuous Forest Gleason, MD 125 mL/hr at 08/24/15 [id number]    . amLODipine (NORVASC) tablet 5 mg  5 mg Oral Daily Forest Gleason, MD   5 mg at 08/24/15 0913  . calcium carbonate (TUMS - dosed in mg elemental calcium) chewable tablet 400 mg of elemental calcium  2 tablet Oral Q6H PRN Lytle [place], MD   400 mg of elemental calcium at 08/21/15 1550  . docusate sodium (COLACE) capsule 100 mg  100 mg Oral BID Lloyd Huger, MD   100 mg at 08/24/15 0913  . enoxaparin (LOVENOX) injection 40 mg  40 mg Subcutaneous Q24H Cammie Sickle, MD   40 mg at 08/24/15 0913  . feeding supplement (ENSURE ENLIVE) (ENSURE ENLIVE) liquid 237 mL  237 mL Oral BID WC Forest Gleason, MD   237 mL at 08/24/15 0913  . heparin lock flush 100 unit/mL  500 Units Intracatheter PRN Forest Gleason, MD      . HYDROcodone-acetaminophen (NORCO/VICODIN) 5-325 MG per tablet 1-2 tablet  1-2 tablet Oral Q6H PRN Lloyd Huger, MD   2 tablet at 08/24/15 0417  . ondansetron (ZOFRAN) injection 4 mg  4 mg Intravenous Q6H PRN Forest Gleason, MD      . pantoprazole (PROTONIX) EC tablet 40 mg  40 mg Oral Daily Cammie Sickle, MD   40 mg at 08/24/15 0913  .  prochlorperazine (COMPAZINE) injection 10 mg  10 mg Intravenous Q6H PRN Cammie Sickle, MD      . sodium chloride flush (NS) 0.9 % injection 10 mL  10 mL Intracatheter PRN Forest Gleason, MD      . sucralfate (CARAFATE) tablet 1 g  1 g Oral TID WC & HS Cammie Sickle, MD   1 g at 08/24/15 [id number]     Discharge Medications: Please see discharge summary for a list of discharge medications.  Relevant Imaging Results:  Relevant Lab Results:   Additional Information SSN:  [id number]  Darden Dates, LCSW

## 2015-08-25 ENCOUNTER — Inpatient Hospital Stay: Payer: Medicaid Other | Admitting: Oncology

## 2015-08-25 LAB — GLUCOSE, CAPILLARY: Glucose-Capillary: 82 mg/dL (ref 65–99)

## 2015-08-25 NOTE — Progress Notes (Signed)
.  Ryan Rush   DOB:Mar 07, 1961   [id number]    Subjective: Pt  Finished chemotherapy on 03/18; s/p neulasta on 3/19th.  No nausea and vomiting; Patient heart burn/indigestion- improved. He is currently on pain regimen including MS Contin 30mg  Q12 hours as well as Vicodin as needed. States that he has only required vicodin once today and pain is 1/10.  ROS:  No reports of chest pain, sob, cough. No diarrhea. Positive for constipation but BM yesterday with stool softeners.  Objective:  Filed Vitals:   08/25/15 0757 08/25/15 1442  BP: 163/73 160/73  Pulse: 84 83  Temp:  98.3 F (36.8 C)  Resp:  18     Intake/Output Summary (Last 24 hours) at 08/25/15 1546 Last data filed at 08/25/15 1300  Gross per 24 hour  Intake    480 ml  Output    300 ml  Net    180 ml    GENERAL:alert, no distress and comfortable;  Is alone. 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  NECK:  Right neck LN/ mass felt 4cm in size.  LUNGS: clear to auscultation and percussion with normal breathing effort HEART: regular rate & rhythm and no murmurs and no lower extremity edema ABDOMEN: abdomen soft, non-tender and normal bowel sounds MS: no cyanosis of digits and no clubbing  NEURO: alert & oriented x 3 with fluent speech, no focal motor/sensory deficits   Labs:  Lab Results  Component Value Date   WBC 9.0 08/24/2015   HGB 13.0 08/24/2015   HCT 37.4* 08/24/2015   MCV 90.7 08/24/2015   PLT 115* 08/24/2015   NEUTROABS 8.2* 08/24/2015    Lab Results  Component Value Date   NA 133* 08/24/2015   K 3.5 08/24/2015   CL 103 08/24/2015   CO2 26 08/24/2015    Studies:  No results found.  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 Cis-5FU- docetaxel, Finished chemo on  3/18th.  Patient s/p  Neulasta on March 19th.    #  Left hip pain- Pain very well managed with MS Contin 30mg  Q12 hours, also has Vicodin PRN.   # DVT prophylaxis- lovenox 40mg  SQ  # Awaiting disposition/placementm, spoke with Jeannie Fend and placement is pending approval due to LOG.    Evlyn Kanner, NP 08/25/2015  3:46 PM

## 2015-08-25 NOTE — Clinical Social Work Note (Signed)
CSW is working with 2 facilities regarding placement. CSW spoke with MD about chemo schedule and how it impacts placements. CSW will continue to follow.   Darden Dates, MSW, LCSW  Clinical Social Worker [phone]

## 2015-08-26 DIAGNOSIS — R1013 Epigastric pain: Secondary | ICD-10-CM

## 2015-08-26 LAB — GLUCOSE, CAPILLARY: Glucose-Capillary: 73 mg/dL (ref 65–99)

## 2015-08-26 NOTE — Clinical Social Work Note (Signed)
CSW is continuing to follow for discharge planning needs. CSW is still attempting to secure a bed at a SNF with LOG. CSW spoke with PSN, who is securing transportation needs. CSW will continue to follow.   Darden Dates, MSW, LCSW  Clinical Social Worker  [phone]

## 2015-08-26 NOTE — Progress Notes (Signed)
Initial Nutrition Assessment   INTERVENTION:   Meals and Snacks: Cater to patient preferences. Recommend Regular diet order to optimize nutritional intake and pt is not diabetic, FSBS WDL, nor receiving insulin. Medical Food Supplement Therapy: Continue as ordered Coordination of Care: will recommend collecting new weight   NUTRITION DIAGNOSIS:   Predicted suboptimal nutrient intake related to social / environmental circumstances as evidenced by per patient/family report.   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 Cis-5FU- docetxael Finished chemo on 3/19th. Patients/p Neulasta post on march 19th.    Diet Order:  Diet regular Room service appropriate?: Yes; Fluid consistency:: Thin    Current Nutrition: Pt eating roughly 60% of meals today. 73% of meals on average prior to today.    Gastrointestinal Profile: Last BM: 08/25/2015   Scheduled Medications:  . amLODipine  5 mg Oral Daily  . docusate sodium  100 mg Oral BID  . enoxaparin (LOVENOX) injection  40 mg Subcutaneous Q24H  . feeding supplement (ENSURE ENLIVE)  237 mL Oral BID WC  . morphine  30 mg Oral Q12H  . pantoprazole  40 mg Oral Daily  . sucralfate  1 g Oral TID WC & HS    Continuous Medications:  . sodium chloride 50 mL/hr at 08/26/15 1221     Electrolyte/Renal Profile and Glucose Profile:   Recent Labs Lab 08/21/15 0502 08/22/15 0525 08/24/15 0514  NA 129* 133* 133*  K 4.0 3.4* 3.5  CL 101 103 103  CO2 21* 23 26  BUN 15 16 13   CREATININE 0.70 0.68 0.59*  CALCIUM 8.3*  8.1* 8.6*  GLUCOSE 103* 91 90   Protein Profile:  Recent Labs Lab 08/20/15 0456 08/21/15 0502 08/22/15 0525  ALBUMIN 3.3* 3.4* 3.3*     Weight Trend since Admission: Filed Weights   08/17/15 1137  Weight: 146 lb (66.225 kg)    BMI:  Body mass index is 21.55 kg/(m^2).  Estimated Nutritional Needs:   Kcal:  BEE: 1475kcals, TEE: (IF 1.1-1.3)(AF 1.3) 1947-2305kcals  Protein:  73-86g protein (1.1-1.3g/kg)  Fluid:  1650-1957mL of fluid (25-1mL/kg)  EDUCATION NEEDS:   No education needs identified at this time   [place], RD, LDN Pager [phone] Weekend/On-Call Pager [phone]

## 2015-08-26 NOTE — Progress Notes (Signed)
.  Ryan Rush   DOB:1961/05/03   [id number]    Subjective: Pt  Finished chemotherapy on 03/18; s/p neulasta on 3/19th.  Left hip pain- improved on long acting MS contin; with norco as needed.    No nausea and vomiting; bowels yesterday/today; no diarrhea- constipation improved.   ROS:   Appetite okay.  No fevers. No chills.    Objective:  Filed Vitals:   08/26/15 0501 08/26/15 0758  BP: 154/85 145/67  Pulse: 78 86  Temp: 97.7 F (36.5 C)   Resp: 18      Intake/Output Summary (Last 24 hours) at 08/26/15 0805 Last data filed at 08/25/15 1700  Gross per 24 hour  Intake    720 ml  Output      0 ml  Net    720 ml    GENERAL:alert, no distress and comfortable;  Is alone. 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  NECK:  Right neck LN/ mass felt 4cm in size.  LUNGS: clear to auscultation and percussion with normal breathing effort HEART: regular rate & rhythm and no murmurs and no lower extremity edema ABDOMEN:abdomen soft, non-tender and normal bowel sounds Musculoskeletal:no cyanosis of digits and no clubbing  NEURO: alert & oriented x 3 with fluent speech, no focal motor/sensory deficits   Labs:  Lab Results  Component Value Date   WBC 9.0 08/24/2015   HGB 13.0 08/24/2015   HCT 37.4* 08/24/2015   MCV 90.7 08/24/2015   PLT 115* 08/24/2015   NEUTROABS 8.2* 08/24/2015    Lab Results  Component Value Date   NA 133* 08/24/2015   K 3.5 08/24/2015   CL 103 08/24/2015   CO2 26 08/24/2015    Studies:  No results found.  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 Cis-5FU- docetxael  Finished chemo on 3/19th.  Patients/p  Neulasta post on march 19th.    #  Left hip pain- improved after adding MS contin.   # dyspepsia- improved on  protonix.  # DVT prophylaxis- lovenox 40mg  SQ  # Awaiting disposition/placement. Spoke To social work yesterday.    Cammie Sickle, MD 08/26/2015  8:05 AM

## 2015-08-27 DIAGNOSIS — I10 Essential (primary) hypertension: Secondary | ICD-10-CM

## 2015-08-27 LAB — CBC
HEMATOCRIT: 35.9 % — AB (ref 40.0–52.0)
HEMOGLOBIN: 12.4 g/dL — AB (ref 13.0–18.0)
MCH: 31.2 pg (ref 26.0–34.0)
MCHC: 34.5 g/dL (ref 32.0–36.0)
MCV: 90.4 fL (ref 80.0–100.0)
Platelets: 114 10*3/uL — ABNORMAL LOW (ref 150–440)
RBC: 3.97 MIL/uL — AB (ref 4.40–5.90)
RDW: 12.9 % (ref 11.5–14.5)
WBC: 2.9 10*3/uL — AB (ref 3.8–10.6)

## 2015-08-27 LAB — CREATININE, SERUM: Creatinine, Ser: 0.66 mg/dL (ref 0.61–1.24)

## 2015-08-27 LAB — GLUCOSE, CAPILLARY: GLUCOSE-CAPILLARY: 82 mg/dL (ref 65–99)

## 2015-08-27 NOTE — Clinical Social Work Note (Signed)
CSW secured a bed for pt at [facility]. CSW updated MD, as well as Asst [employer] [facility] [place] Work. CSW sent in information for LOG. CSW also updated Patient Service Navigator at the [facility] who will be arranging transportation to and from treatments. CSW also spoke with pt regarding discharge plan, and he is agreeable to discharge plan. CSW will continue to follow.   Darden Dates, MSW, LCSW  Clinical Social Worker  [phone]

## 2015-08-27 NOTE — Progress Notes (Signed)
  Ryan Rush   DOB:[birth year]   [id number]    Subjective: Pt  Finished chemotherapy on 03/18; s/p neulasta on 3/19th. Complains of low back pain today. Denies any difficulty swallowing.  Left hip pain- improved on long acting MS contin; with norco as needed.    No nausea and vomiting; bowels moving well.  no diarrhea- constipation improved.   ROS:   Appetite okay.  No fevers. No chills.    Objective:  Filed Vitals:   08/26/15 2035 08/27/15 0500  BP: 155/66 140/72  Pulse: 89 81  Temp: 98.5 F (36.9 C) 97.7 F (36.5 C)  Resp:       Intake/Output Summary (Last 24 hours) at 08/27/15 0820 Last data filed at 08/26/15 1700  Gross per 24 hour  Intake    600 ml  Output      0 ml  Net    600 ml    GENERAL:alert, no distress and comfortable;  Is alone. 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  NECK:  Right neck LN/ mass felt 4cm in size.  LUNGS: clear to auscultation and percussion with normal breathing effort HEART: regular rate & rhythm and no murmurs and no lower extremity edema ABDOMEN:abdomen soft, non-tender and normal bowel sounds Musculoskeletal:no cyanosis of digits and no clubbing  NEURO: alert & oriented x 3 with fluent speech, no focal motor/sensory deficits   Labs:  Lab Results  Component Value Date   WBC 2.9* 08/27/2015   HGB 12.4* 08/27/2015   HCT 35.9* 08/27/2015   MCV 90.4 08/27/2015   PLT 114* 08/27/2015   NEUTROABS 8.2* 08/24/2015    Lab Results  Component Value Date   NA 133* 08/24/2015   K 3.5 08/24/2015   CL 103 08/24/2015   CO2 26 08/24/2015    Studies:  No results found.  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 Cis-5FU- docetxael  Finished chemo on 3/19th.  Patients/p  Neulasta post on march 19th.   Patient will be due for chemotherapy again in 3 weeks.  #  Left hip pain- improved after adding MS contin. Continue current pain regimen.  # dyspepsia- improved on protonix.  # DVT prophylaxis- lovenox 40mg  SQ  # Awaiting disposition/placement.    Cammie Sickle, MD 08/27/2015  8:20 AM

## 2015-08-28 ENCOUNTER — Other Ambulatory Visit: Payer: Self-pay | Admitting: *Deleted

## 2015-08-28 DIAGNOSIS — Z95828 Presence of other vascular implants and grafts: Secondary | ICD-10-CM

## 2015-08-28 DIAGNOSIS — C099 Malignant neoplasm of tonsil, unspecified: Secondary | ICD-10-CM

## 2015-08-28 MED ORDER — LIDOCAINE-PRILOCAINE 2.5-2.5 % EX CREA: 1.0000 "application " | TOPICAL_CREAM | CUTANEOUS | Status: DC | PRN

## 2015-08-28 MED ORDER — HEPARIN SOD (PORK) LOCK FLUSH 100 UNIT/ML IV SOLN
500.0000 [IU] | Freq: Once | INTRAVENOUS | Status: AC
  Administered 2015-08-28: 500 [IU] via INTRAVENOUS
  Filled 2015-08-28: qty 5

## 2015-08-28 NOTE — Progress Notes (Signed)
Report called to Crystal at [facility], EMS called for transport, pt with no noted distress or discomfort, voices any needs

## 2015-08-28 NOTE — Clinical Social Work Note (Signed)
Pt is ready for discharge today and will go to [facility] with a Letter of Guarantee. Pt is agreeable to discharge plan. Facility has received all discharge information and is ready to admit pt. CSW also updated Patient Services Navigator. RN called report. [facility] will provide transportation (as staff with Asst [employer]). CSW is signing off as no further needs identified.   Ryan Rush, MSW, LCSW  Clinical Social Worker [phone]

## 2015-08-28 NOTE — Progress Notes (Signed)
Pt being discharged via EMS at this time, pt with no noted complaints at discharge

## 2015-08-28 NOTE — Discharge Summary (Signed)
[place] [facility] [place] NAME: Ryan Rush    MR#:  [id number]  DATE OF BIRTH:  [birth year]  DATE OF ADMISSION:  08/17/2015 ADMITTING PHYSICIAN: Forest Gleason, MD  DATE OF DISCHARGE: 08/28/2015  PRIMARY CARE PHYSICIAN: No primary care provider on file.    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 Cancer ([place])   . Hypertension     HOSPITAL COURSE:   HPI: 55 year old gentleman was noticed swelling on the right side of the neck over 2 of last 3-6 months. Patient is unfortunately home less with very poor access to medical care went to emergency room and swelling but still bigger. And has some difficulty swallowing. Evaluated by ENT physician and needle biopsy of the tonsillar mass was done CT scan of soft tissue revealed extensive bilateral disease with soft tissue mass. Biopsy subsequently showed SQUAMOUS CELL CA of Right tongue base. Patient was admitted to the hospital for chemotherapy/imaging.  # SQUAMOUS CELL CA of Right tongue base. Further work up including PET scan showed- tonsill-bilateral cervical LN/abdominal LN with metastases to liver/ bone; reviewed the results of PET scan; Goal of treatment palliative/control. Patient received chemo- Cis-5FU- docetxael Finished chemo on 3/19th. Patients/p Neulasta post on march 19th. Tolerated chemo without any nausea/vomitting.   # Mild thrombocytopenia/leukopenia secondary to chemotherapy.   # Left hip pain sec to met- on MS contin; norco as needed.   # HTN- on amlodipine  DISCHARGE CONDITIONS:   STABLE  CONSULTS OBTAINED:  Treatment Team:  Algernon Huxley, MD  DRUG ALLERGIES:  No Known  Allergies  DISCHARGE MEDICATIONS:   Current Discharge Medication List    START taking these medications   Details  amLODipine (NORVASC) 5 MG tablet Take 1 tablet (5 mg total) by mouth daily. Qty: 60 tablet, Refills: 1   Associated Diagnoses: Tonsil cancer ([place])    feeding supplement, ENSURE ENLIVE, (ENSURE ENLIVE) LIQD Take 237 mLs by mouth 2 (two) times daily with a meal. Qty: 237 mL, Refills: 12    HYDROcodone-acetaminophen (NORCO) 10-325 MG tablet Take 1 tablet by mouth every 8 (eight) hours as needed. Qty: 30 tablet, Refills: 0    loperamide (IMODIUM) 2 MG capsule Take 1 capsule (2 mg total) by mouth as needed for diarrhea or loose stools. Qty: 30 capsule, Refills: 0    morphine (MS CONTIN) 30 MG 12 hr tablet Take 1 tablet (30 mg total) by mouth every 12 (twelve) hours. Qty: 60 tablet, Refills: 0    ondansetron (ZOFRAN) 8 MG tablet Take 1 tablet (8 mg total) by mouth every 8 (eight) hours as needed for nausea or vomiting. Qty: 30 tablet, Refills: 0    pantoprazole (PROTONIX) 40 MG tablet Take 1 tablet (40 mg total) by mouth daily. Qty: 60 tablet, Refills: 3   Associated Diagnoses: Heartburn    prochlorperazine (COMPAZINE) 10 MG tablet Take 1 tablet (10 mg total) by mouth every 6 (six) hours as needed for nausea or vomiting. Qty: 60 tablet, Refills: 0         DISCHARGE INSTRUCTIONS:  Follow up in cancer center on 29th March- cancer center will make the appt for follow up.   DIET:  Regular diet  DISCHARGE CONDITION:  Stable  ACTIVITY:  Activity as tolerated  OXYGEN:  Home Oxygen: No.   Oxygen Delivery: room air  DISCHARGE LOCATION:  [place]  If you experience worsening of your admission symptoms, develop shortness of breath, life threatening emergency, suicidal or homicidal thoughts you must seek medical attention immediately by calling 911 or calling your MD immediately  if symptoms less severe.  You Must read complete instructions/literature  along with all the possible adverse reactions/side effects for all the Medicines you take and that have been prescribed to you. Take any new Medicines after you have completely understood and accpet all the possible adverse reactions/side effects.      On the day of Discharge:  VITAL SIGNS:  Blood pressure 146/67, pulse 83, temperature 98.2 F (36.8 C), temperature source Oral, resp. rate 20, height 5' 9"  (1.753 m), weight 146 lb (66.225 kg), SpO2 99 %.   Subjective: Denies any nausea vomiting. Denies any significant left hip pain. He is able ambulate. No diarrhea. Constipation improved. Patient notes to have improvement of his left neck adenopathy. Denies any difficulty swallowing or sores in th mouth.   ROS: No chest pain or shortness of breath.  PHYSICAL EXAMINATION:   GENERAL:alert, no distress and comfortable; Is alone. 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  NECK: Right neck LN/ mass felt 3-4cm in size.  LUNGS: clear to auscultation and percussion with normal breathing effort HEART: regular rate & rhythm and no murmurs and no lower extremity edema ABDOMEN:abdomen soft, non-tender and normal bowel sounds Musculoskeletal:no cyanosis of digits and no clubbing  NEURO: alert & oriented x 3 with fluent speech, no focal motor/sensory deficits  DATA REVIEW:   CBC  Recent Labs Lab 08/27/15 0455  WBC 2.9*  HGB 12.4*  HCT 35.9*  PLT 114*    Chemistries   Recent Labs Lab 08/22/15 0525 08/24/15 0514 08/27/15 0455  NA 133* 133*  --   K 3.4* 3.5  --   CL 103 103  --   CO2 23 26  --   GLUCOSE 91 90  --   BUN 16 13  --   CREATININE 0.68 0.59* 0.66  CALCIUM 8.1* 8.6*  --   AST 67*  --   --   ALT 51  --   --   ALKPHOS 60  --   --   BILITOT 0.9  --   --     Cardiac Enzymes No results for input(s): TROPONINI in the last 168 hours.  Microbiology Results  Results for orders placed or performed  during the hospital encounter of 08/17/15  Surgical PCR screen     Status: None   Collection Time: 08/17/15  1:49 PM  Result Value Ref Range Status   MRSA, PCR NEGATIVE NEGATIVE Final   Staphylococcus aureus NEGATIVE NEGATIVE Final    Comment:        The Xpert SA Assay (FDA approved for NASAL specimens in patients over 33 years of age), is one component of a comprehensive surveillance program.  Test performance has been validated by [facility] for patients greater than or equal to 36 year old. It is not intended to diagnose infection nor to guide or monitor treatment.     RADIOLOGY:  No results found.   Management plans discussed with the patient, family and they are in agreement.  CODE STATUS:     Code Status Orders        Start  Ordered   08/17/15 1133  Full code   Continuous     08/17/15 1132    Code Status History    Date Active Date Inactive Code Status Order ID Comments User Context   This patient has a current code status but no historical code status.      TOTAL TIME TAKING CARE OF THIS PATIENT: 35 minutes.    Cammie Sickle M.D on 08/28/2015 at 7:53 AM    CC: Primary care physician; No primary care provider on file.   Note: This dictation was prepared with Dragon dictation along with smaller phrase technology. Any transcriptional errors that result from this process are unintentional.

## 2015-09-02 ENCOUNTER — Inpatient Hospital Stay (HOSPITAL_BASED_OUTPATIENT_CLINIC_OR_DEPARTMENT_OTHER): Payer: Medicaid Other | Admitting: Internal Medicine

## 2015-09-02 ENCOUNTER — Inpatient Hospital Stay: Payer: Medicaid Other

## 2015-09-02 VITALS — BP 121/65 | HR 84 | Temp 96.9°F | Resp 18 | Wt 143.3 lb

## 2015-09-02 DIAGNOSIS — R221 Localized swelling, mass and lump, neck: Secondary | ICD-10-CM | POA: Diagnosis not present

## 2015-09-02 DIAGNOSIS — F17210 Nicotine dependence, cigarettes, uncomplicated: Secondary | ICD-10-CM

## 2015-09-02 DIAGNOSIS — C760 Malignant neoplasm of head, face and neck: Secondary | ICD-10-CM

## 2015-09-02 DIAGNOSIS — Z95828 Presence of other vascular implants and grafts: Secondary | ICD-10-CM

## 2015-09-02 DIAGNOSIS — R1310 Dysphagia, unspecified: Secondary | ICD-10-CM

## 2015-09-02 DIAGNOSIS — Z590 Homelessness: Secondary | ICD-10-CM

## 2015-09-02 DIAGNOSIS — C7951 Secondary malignant neoplasm of bone: Secondary | ICD-10-CM

## 2015-09-02 DIAGNOSIS — C01 Malignant neoplasm of base of tongue: Secondary | ICD-10-CM | POA: Diagnosis not present

## 2015-09-02 DIAGNOSIS — C787 Secondary malignant neoplasm of liver and intrahepatic bile duct: Secondary | ICD-10-CM | POA: Diagnosis not present

## 2015-09-02 DIAGNOSIS — R599 Enlarged lymph nodes, unspecified: Secondary | ICD-10-CM | POA: Diagnosis not present

## 2015-09-02 DIAGNOSIS — C099 Malignant neoplasm of tonsil, unspecified: Secondary | ICD-10-CM

## 2015-09-02 LAB — COMPREHENSIVE METABOLIC PANEL
ALT: 16 U/L — AB (ref 17–63)
AST: 20 U/L (ref 15–41)
Albumin: 3.8 g/dL (ref 3.5–5.0)
Alkaline Phosphatase: 102 U/L (ref 38–126)
Anion gap: 5 (ref 5–15)
BUN: 9 mg/dL (ref 6–20)
CHLORIDE: 99 mmol/L — AB (ref 101–111)
CO2: 29 mmol/L (ref 22–32)
CREATININE: 0.86 mg/dL (ref 0.61–1.24)
Calcium: 8.7 mg/dL — ABNORMAL LOW (ref 8.9–10.3)
GFR calc non Af Amer: >60 mL/min (ref >60)
Glucose, Bld: 101 mg/dL — ABNORMAL HIGH (ref 65–99)
Potassium: 3.6 mmol/L (ref 3.5–5.1)
SODIUM: 133 mmol/L — AB (ref 135–145)
TOTAL PROTEIN: 7.5 g/dL (ref 6.5–8.1)
Total Bilirubin: 0.4 mg/dL (ref 0.3–1.2)

## 2015-09-02 LAB — CBC WITH DIFFERENTIAL/PLATELET
BASOS ABS: 0.1 10*3/uL (ref 0–0.1)
Basophils Relative: 1 %
Eosinophils Absolute: 0 10*3/uL (ref 0–0.7)
Eosinophils Relative: 0 %
HCT: 37.7 % — ABNORMAL LOW (ref 40.0–52.0)
Hemoglobin: 12.8 g/dL — ABNORMAL LOW (ref 13.0–18.0)
LYMPHS ABS: 1.4 10*3/uL (ref 1.0–3.6)
LYMPHS PCT: 12 %
MCH: 31.2 pg (ref 26.0–34.0)
MCHC: 33.9 g/dL (ref 32.0–36.0)
MCV: 92 fL (ref 80.0–100.0)
MONO ABS: 0.7 10*3/uL (ref 0.2–1.0)
Monocytes Relative: 6 %
NEUTROS ABS: 9.5 10*3/uL — AB (ref 1.4–6.5)
Neutrophils Relative %: 81 %
PLATELETS: 127 10*3/uL — AB (ref 150–440)
RBC: 4.09 MIL/uL — AB (ref 4.40–5.90)
RDW: 14.6 % — ABNORMAL HIGH (ref 11.5–14.5)
WBC: 11.7 10*3/uL — ABNORMAL HIGH (ref 3.8–10.6)

## 2015-09-02 LAB — MAGNESIUM: MAGNESIUM: 1.9 mg/dL (ref 1.7–2.4)

## 2015-09-02 NOTE — Progress Notes (Signed)
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   Patient denied any nausea vomiting;  His appetite is good. Denies any constipation or diarrhea. Left hip pain is improved. He is currently on MS Contin not eating to take any breakthrough pain medication.  Patient has noted improvement of his right-sided neck lymphadenopathy improved. No sores in the mouth. Patient is currently in the assisted livin Appetite good.  REVIEW OF SYSTEMS:  A complete 10 point review of system is done which is negative except mentioned above/history of present illness.   PAST MEDICAL HISTORY :  Past Medical History  Diagnosis Date  . Cancer ([place])   . Hypertension     PAST SURGICAL HISTORY :   Past Surgical History  Procedure Laterality Date  . Peripheral vascular catheterization N/A 08/18/2015    Procedure: Glori Luis Cath Insertion;  Surgeon: Katha Cabal, MD;  Location: [facility];  Service: Cardiovascular;  Laterality: N/A;    FAMILY HISTORY :  No family history on file.  SOCIAL HISTORY:   Social History  Substance Use Topics  . Smoking status: Current Every Day Smoker -- 1.00 packs/day    Types: Cigarettes  . Smokeless tobacco: Not on file  . Alcohol Use: No    ALLERGIES:  has No Known Allergies.  MEDICATIONS:  Current Outpatient Prescriptions  Medication Sig Dispense Refill  . amLODipine (NORVASC) 5 MG tablet Take 1 tablet (5 mg total) by mouth daily. 60 tablet 1  . lidocaine-prilocaine  (EMLA) cream Apply 1 application topically as needed. Apply to port a cath site 1 hours before treatments 30 g 3  . loperamide (IMODIUM) 2 MG capsule Take 1 capsule (2 mg total) by mouth as needed for diarrhea or loose stools. 30 capsule 0  . morphine (MS CONTIN) 30 MG 12 hr tablet Take 1 tablet (30 mg total) by mouth every 12 (twelve) hours. 60 tablet 0  . ondansetron (ZOFRAN) 8 MG tablet Take 1 tablet (8 mg total) by mouth every 8 (eight) hours as needed for nausea or vomiting. 30 tablet 0  . pantoprazole (PROTONIX) 40 MG tablet Take 1 tablet (40 mg total) by mouth daily. 60 tablet 3  . prochlorperazine (COMPAZINE) 10 MG tablet Take 1 tablet (10 mg total) by mouth every 6 (six) hours as needed for nausea or vomiting. 60 tablet 0   No current facility-administered medications for this visit.    PHYSICAL EXAMINATION: ECOG PERFORMANCE STATUS: 0 - Asymptomatic  BP 121/65 mmHg  Pulse 84  Temp(Src) 96.9 F (36.1 C) (Tympanic)  Resp 18  Wt 143 lb 4.8 oz (65 kg)  Filed Weights   09/02/15 1117  Weight: 143 lb 4.8 oz (65 kg)    GENERAL: Well-nourished well-developed; Alert, no distress and comfortable.   Alone.  EYES: no pallor or icterus OROPHARYNX: no thrush; right tonsillar oral ulceration improving.  NECK: supple, no masses felt LYMPH:  3-4 cm Left neck mass improving LUNGS: clear to auscultation and  No wheeze or crackles HEART/CVS: regular rate & rhythm and no  murmurs; No lower extremity edema ABDOMEN:abdomen soft, non-tender and normal bowel sounds Musculoskeletal:no cyanosis of digits and no clubbing  PSYCH: alert & oriented x 3 with fluent speech NEURO: no focal motor/sensory deficits SKIN:  no rashes or significant lesions  LABORATORY DATA:  I have reviewed the data as listed    Component Value Date/Time   NA 133* 08/24/2015 0514   K 3.5 08/24/2015 0514   CL 103 08/24/2015 0514   CO2 26 08/24/2015 0514   GLUCOSE 90 08/24/2015 0514   BUN 13 08/24/2015 0514    CREATININE 0.66 08/27/2015 0455   CALCIUM 8.6* 08/24/2015 0514   PROT 6.7 08/22/2015 0525   ALBUMIN 3.3* 08/22/2015 0525   AST 67* 08/22/2015 0525   ALT 51 08/22/2015 0525   ALKPHOS 60 08/22/2015 0525   BILITOT 0.9 08/22/2015 0525   GFRNONAA >60 08/27/2015 0455   GFRAA >60 08/27/2015 0455    No results found for: SPEP, UPEP  Lab Results  Component Value Date   WBC 2.9* 08/27/2015   NEUTROABS 8.2* 08/24/2015   HGB 12.4* 08/27/2015   HCT 35.9* 08/27/2015   MCV 90.4 08/27/2015   PLT 114* 08/27/2015      Chemistry      Component Value Date/Time   NA 133* 08/24/2015 0514   K 3.5 08/24/2015 0514   CL 103 08/24/2015 0514   CO2 26 08/24/2015 0514   BUN 13 08/24/2015 0514   CREATININE 0.66 08/27/2015 0455      Component Value Date/Time   CALCIUM 8.6* 08/24/2015 0514   ALKPHOS 60 08/22/2015 0525   AST 67* 08/22/2015 0525   ALT 51 08/22/2015 0525   BILITOT 0.9 08/22/2015 0525        ASSESSMENT & PLAN:   # Squamous cell carcinoma of base of tongue metastatic to the liver and bone- On first line chemotherapy with TPF. Patient tolerated cycle 1 extremely well.  # Proceed with cycle #2 on April 5th; patient will go home on a 5-FU pump and will come back on April 10 for the pump unhooking/ and also on Pro.   # Left hip pain improved- continue MS Contin at this time.  # I also the patient back on April 5/labs/ plan chemotherapy as discussed above. And I will see the patient back on April 17 with labs for follow-up.   # 25 minutes face-to-face with the patient discussing the above plan of care; more than 50% of time spent on prognosis/ natural history; counseling and coordination.      Cammie Sickle, MD 09/02/2015 11:27 AM

## 2015-09-02 NOTE — Progress Notes (Signed)
Patient here today as hospital follow up regarding chemotherapy treatment.  States he is now @ [facility].  States first treatment went well.  Has not been sick.  Very grateful for all that has been done on his behalf.

## 2015-09-03 ENCOUNTER — Inpatient Hospital Stay: Payer: Medicaid Other

## 2015-09-03 NOTE — Patient Instructions (Signed)
Docetaxel injection  What is this medicine?  DOCETAXEL (doe se TAX el) is a chemotherapy drug. It targets fast dividing cells, like cancer cells, and causes these cells to die. This medicine is used to treat many types of cancers like breast cancer, certain stomach cancers, head and neck cancer, lung cancer, and prostate cancer.  This medicine may be used for other purposes; ask your health care provider or pharmacist if you have questions.  What should I tell my health care provider before I take this medicine?  They need to know if you have any of these conditions:  -infection (especially a virus infection such as chickenpox, cold sores, or herpes)  -liver disease  -low blood counts, like low white cell, platelet, or red cell counts  -an unusual or allergic reaction to docetaxel, polysorbate 80, other chemotherapy agents, other medicines, foods, dyes, or preservatives  -pregnant or trying to get pregnant  -breast-feeding  How should I use this medicine?  This drug is given as an infusion into a vein. It is administered in a hospital or clinic by a specially trained health care professional.  Talk to your pediatrician regarding the use of this medicine in children. Special care may be needed.  Overdosage: If you think you have taken too much of this medicine contact a poison control center or emergency room at once.  NOTE: This medicine is only for you. Do not share this medicine with others.  What if I miss a dose?  It is important not to miss your dose. Call your doctor or health care professional if you are unable to keep an appointment.  What may interact with this medicine?  -cyclosporine  -erythromycin  -ketoconazole  -medicines to increase blood counts like filgrastim, pegfilgrastim, sargramostim  -vaccines  Talk to your doctor or health care professional before taking any of these medicines:  -acetaminophen  -aspirin  -ibuprofen  -ketoprofen  -naproxen  This list may not describe all possible interactions.  Give your health care provider a list of all the medicines, herbs, non-prescription drugs, or dietary supplements you use. Also tell them if you smoke, drink alcohol, or use illegal drugs. Some items may interact with your medicine.  What should I watch for while using this medicine?  Your condition will be monitored carefully while you are receiving this medicine. You will need important blood work done while you are taking this medicine.  This drug may make you feel generally unwell. This is not uncommon, as chemotherapy can affect healthy cells as well as cancer cells. Report any side effects. Continue your course of treatment even though you feel ill unless your doctor tells you to stop.  In some cases, you may be given additional medicines to help with side effects. Follow all directions for their use.  Call your doctor or health care professional for advice if you get a fever, chills or sore throat, or other symptoms of a cold or flu. Do not treat yourself. This drug decreases your body's ability to fight infections. Try to avoid being around people who are sick.  This medicine may increase your risk to bruise or bleed. Call your doctor or health care professional if you notice any unusual bleeding.  This medicine may contain alcohol in the product. You may get drowsy or dizzy. Do not drive, use machinery, or do anything that needs mental alertness until you know how this medicine affects you. Do not stand or sit up quickly, especially if you are an older   There is a potential for serious side effects to an unborn child. Talk to your health care professional or pharmacist for more information. Do not breast-feed an infant while taking this medicine. What side effects may I notice  from receiving this medicine? Side effects that you should report to your doctor or health care professional as soon as possible: -allergic reactions like skin rash, itching or hives, swelling of the face, lips, or tongue -low blood counts - This drug may decrease the number of white blood cells, red blood cells and platelets. You may be at increased risk for infections and bleeding. -signs of infection - fever or chills, cough, sore throat, pain or difficulty passing urine -signs of decreased platelets or bleeding - bruising, pinpoint red spots on the skin, black, tarry stools, nosebleeds -signs of decreased red blood cells - unusually weak or tired, fainting spells, lightheadedness -breathing problems -fast or irregular heartbeat -low blood pressure -mouth sores -nausea and vomiting -pain, swelling, redness or irritation at the injection site -pain, tingling, numbness in the hands or feet -swelling of the ankle, feet, hands -weight gain Side effects that usually do not require medical attention (report to your prescriber or health care professional if they continue or are bothersome): -bone pain -complete hair loss including hair on your head, underarms, pubic hair, eyebrows, and eyelashes -diarrhea -excessive tearing -changes in the color of fingernails -loosening of the fingernails -nausea -muscle pain -red flush to skin -sweating -weak or tired This list may not describe all possible side effects. Call your doctor for medical advice about side effects. You may report side effects to FDA at 1-800-FDA-1088. Where should I keep my medicine? This drug is given in a hospital or clinic and will not be stored at home. NOTE: This sheet is a summary. It may not cover all possible information. If you have questions about this medicine, talk to your doctor, pharmacist, or health care provider.    2016, [employer] Standard. (2014-06-09 16:04:57) Cisplatin injection What is this  medicine? CISPLATIN (SIS pla tin) is a chemotherapy drug. It targets fast dividing cells, like cancer cells, and causes these cells to die. This medicine is used to treat many types of cancer like bladder, ovarian, and testicular cancers. This medicine may be used for other purposes; ask your health care provider or pharmacist if you have questions. What should I tell my health care provider before I take this medicine? They need to know if you have any of these conditions: -blood disorders -hearing problems -kidney disease -recent or ongoing radiation therapy -an unusual or allergic reaction to cisplatin, carboplatin, other chemotherapy, other medicines, foods, dyes, or preservatives -pregnant or trying to get pregnant -breast-feeding How should I use this medicine? This drug is given as an infusion into a vein. It is administered in a hospital or clinic by a specially trained health care professional. Talk to your pediatrician regarding the use of this medicine in children. Special care may be needed. Overdosage: If you think you have taken too much of this medicine contact a poison control center or emergency room at once. NOTE: This medicine is only for you. Do not share this medicine with others. What if I miss a dose? It is important not to miss a dose. Call your doctor or health care professional if you are unable to keep an appointment. What may interact with this medicine? -dofetilide -foscarnet -medicines for seizures -medicines to increase blood counts like filgrastim, pegfilgrastim, sargramostim -probenecid -pyridoxine used with altretamine -rituximab -  some antibiotics like amikacin, gentamicin, neomycin, polymyxin B, streptomycin, tobramycin -sulfinpyrazone -vaccines -zalcitabine Talk to your doctor or health care professional before taking any of these medicines: -acetaminophen -aspirin -ibuprofen -ketoprofen -naproxen This list may not describe all possible  interactions. Give your health care provider a list of all the medicines, herbs, non-prescription drugs, or dietary supplements you use. Also tell them if you smoke, drink alcohol, or use illegal drugs. Some items may interact with your medicine. What should I watch for while using this medicine? Your condition will be monitored carefully while you are receiving this medicine. You will need important blood work done while you are taking this medicine. This drug may make you feel generally unwell. This is not uncommon, as chemotherapy can affect healthy cells as well as cancer cells. Report any side effects. Continue your course of treatment even though you feel ill unless your doctor tells you to stop. In some cases, you may be given additional medicines to help with side effects. Follow all directions for their use. Call your doctor or health care professional for advice if you get a fever, chills or sore throat, or other symptoms of a cold or flu. Do not treat yourself. This drug decreases your body's ability to fight infections. Try to avoid being around people who are sick. This medicine may increase your risk to bruise or bleed. Call your doctor or health care professional if you notice any unusual bleeding. Be careful brushing and flossing your teeth or using a toothpick because you may get an infection or bleed more easily. If you have any dental work done, tell your dentist you are receiving this medicine. Avoid taking products that contain aspirin, acetaminophen, ibuprofen, naproxen, or ketoprofen unless instructed by your doctor. These medicines may hide a fever. Do not become pregnant while taking this medicine. Women should inform their doctor if they wish to become pregnant or think they might be pregnant. There is a potential for serious side effects to an unborn child. Talk to your health care professional or pharmacist for more information. Do not breast-feed an infant while taking this  medicine. Drink fluids as directed while you are taking this medicine. This will help protect your kidneys. Call your doctor or health care professional if you get diarrhea. Do not treat yourself. What side effects may I notice from receiving this medicine? Side effects that you should report to your doctor or health care professional as soon as possible: -allergic reactions like skin rash, itching or hives, swelling of the face, lips, or tongue -signs of infection - fever or chills, cough, sore throat, pain or difficulty passing urine -signs of decreased platelets or bleeding - bruising, pinpoint red spots on the skin, black, tarry stools, nosebleeds -signs of decreased red blood cells - unusually weak or tired, fainting spells, lightheadedness -breathing problems -changes in hearing -gout pain -low blood counts - This drug may decrease the number of white blood cells, red blood cells and platelets. You may be at increased risk for infections and bleeding. -nausea and vomiting -pain, swelling, redness or irritation at the injection site -pain, tingling, numbness in the hands or feet -problems with balance, movement -trouble passing urine or change in the amount of urine Side effects that usually do not require medical attention (report to your doctor or health care professional if they continue or are bothersome): -changes in vision -loss of appetite -metallic taste in the mouth or changes in taste This list may not describe all possible side  effects. Call your doctor for medical advice about side effects. You may report side effects to FDA at 1-800-FDA-1088. Where should I keep my medicine? This drug is given in a hospital or clinic and will not be stored at home. NOTE: This sheet is a summary. It may not cover all possible information. If you have questions about this medicine, talk to your doctor, pharmacist, or health care provider.    2016, Elsevier/Gold Standard. (2007-08-28  14:40:54) Fluorouracil, 5-FU injection What is this medicine? FLUOROURACIL, 5-FU (flure oh YOOR a sil) is a chemotherapy drug. It slows the growth of cancer cells. This medicine is used to treat many types of cancer like breast cancer, colon or rectal cancer, pancreatic cancer, and stomach cancer. This medicine may be used for other purposes; ask your health care provider or pharmacist if you have questions. What should I tell my health care provider before I take this medicine? They need to know if you have any of these conditions: -blood disorders -dihydropyrimidine dehydrogenase (DPD) deficiency -infection (especially a virus infection such as chickenpox, cold sores, or herpes) -kidney disease -liver disease -malnourished, poor nutrition -recent or ongoing radiation therapy -an unusual or allergic reaction to fluorouracil, other chemotherapy, other medicines, foods, dyes, or preservatives -pregnant or trying to get pregnant -breast-feeding How should I use this medicine? This drug is given as an infusion or injection into a vein. It is administered in a hospital or clinic by a specially trained health care professional. Talk to your pediatrician regarding the use of this medicine in children. Special care may be needed. Overdosage: If you think you have taken too much of this medicine contact a poison control center or emergency room at once. NOTE: This medicine is only for you. Do not share this medicine with others. What if I miss a dose? It is important not to miss your dose. Call your doctor or health care professional if you are unable to keep an appointment. What may interact with this medicine? -allopurinol -cimetidine -dapsone -digoxin -hydroxyurea -leucovorin -levamisole -medicines for seizures like ethotoin, fosphenytoin, phenytoin -medicines to increase blood counts like filgrastim, pegfilgrastim, sargramostim -medicines that treat or prevent blood clots like warfarin,  enoxaparin, and dalteparin -methotrexate -metronidazole -pyrimethamine -some other chemotherapy drugs like busulfan, cisplatin, estramustine, vinblastine -trimethoprim -trimetrexate -vaccines Talk to your doctor or health care professional before taking any of these medicines: -acetaminophen -aspirin -ibuprofen -ketoprofen -naproxen This list may not describe all possible interactions. Give your health care provider a list of all the medicines, herbs, non-prescription drugs, or dietary supplements you use. Also tell them if you smoke, drink alcohol, or use illegal drugs. Some items may interact with your medicine. What should I watch for while using this medicine? Visit your doctor for checks on your progress. This drug may make you feel generally unwell. This is not uncommon, as chemotherapy can affect healthy cells as well as cancer cells. Report any side effects. Continue your course of treatment even though you feel ill unless your doctor tells you to stop. In some cases, you may be given additional medicines to help with side effects. Follow all directions for their use. Call your doctor or health care professional for advice if you get a fever, chills or sore throat, or other symptoms of a cold or flu. Do not treat yourself. This drug decreases your body's ability to fight infections. Try to avoid being around people who are sick. This medicine may increase your risk to bruise or bleed. Call your doctor  or health care professional if you notice any unusual bleeding. Be careful brushing and flossing your teeth or using a toothpick because you may get an infection or bleed more easily. If you have any dental work done, tell your dentist you are receiving this medicine. Avoid taking products that contain aspirin, acetaminophen, ibuprofen, naproxen, or ketoprofen unless instructed by your doctor. These medicines may hide a fever. Do not become pregnant while taking this medicine. Women should  inform their doctor if they wish to become pregnant or think they might be pregnant. There is a potential for serious side effects to an unborn child. Talk to your health care professional or pharmacist for more information. Do not breast-feed an infant while taking this medicine. Men should inform their doctor if they wish to father a child. This medicine may lower sperm counts. Do not treat diarrhea with over the counter products. Contact your doctor if you have diarrhea that lasts more than 2 days or if it is severe and watery. This medicine can make you more sensitive to the sun. Keep out of the sun. If you cannot avoid being in the sun, wear protective clothing and use sunscreen. Do not use sun lamps or tanning beds/booths. What side effects may I notice from receiving this medicine? Side effects that you should report to your doctor or health care professional as soon as possible: -allergic reactions like skin rash, itching or hives, swelling of the face, lips, or tongue -low blood counts - this medicine may decrease the number of white blood cells, red blood cells and platelets. You may be at increased risk for infections and bleeding. -signs of infection - fever or chills, cough, sore throat, pain or difficulty passing urine -signs of decreased platelets or bleeding - bruising, pinpoint red spots on the skin, black, tarry stools, blood in the urine -signs of decreased red blood cells - unusually weak or tired, fainting spells, lightheadedness -breathing problems -changes in vision -chest pain -mouth sores -nausea and vomiting -pain, swelling, redness at site where injected -pain, tingling, numbness in the hands or feet -redness, swelling, or sores on hands or feet -stomach pain -unusual bleeding Side effects that usually do not require medical attention (report to your doctor or health care professional if they continue or are bothersome): -changes in finger or toe  nails -diarrhea -dry or itchy skin -hair loss -headache -loss of appetite -sensitivity of eyes to the light -stomach upset -unusually teary eyes This list may not describe all possible side effects. Call your doctor for medical advice about side effects. You may report side effects to FDA at 1-800-FDA-1088. Where should I keep my medicine? This drug is given in a hospital or clinic and will not be stored at home. NOTE: This sheet is a summary. It may not cover all possible information. If you have questions about this medicine, talk to your doctor, pharmacist, or health care provider.    2016, Elsevier/Gold Standard. (2007-09-26 13:53:16)

## 2015-09-07 ENCOUNTER — Ambulatory Visit: Payer: Self-pay

## 2015-09-07 ENCOUNTER — Ambulatory Visit: Payer: Self-pay | Admitting: Internal Medicine

## 2015-09-07 ENCOUNTER — Other Ambulatory Visit: Payer: Self-pay

## 2015-09-08 ENCOUNTER — Other Ambulatory Visit: Payer: Self-pay | Admitting: *Deleted

## 2015-09-08 DIAGNOSIS — C099 Malignant neoplasm of tonsil, unspecified: Secondary | ICD-10-CM

## 2015-09-09 ENCOUNTER — Inpatient Hospital Stay: Payer: Medicaid Other | Attending: Internal Medicine

## 2015-09-09 ENCOUNTER — Other Ambulatory Visit: Payer: Self-pay | Admitting: Internal Medicine

## 2015-09-09 ENCOUNTER — Inpatient Hospital Stay (HOSPITAL_BASED_OUTPATIENT_CLINIC_OR_DEPARTMENT_OTHER): Payer: Medicaid Other | Admitting: Internal Medicine

## 2015-09-09 ENCOUNTER — Inpatient Hospital Stay: Payer: Medicaid Other

## 2015-09-09 VITALS — BP 115/69 | HR 71 | Resp 18

## 2015-09-09 VITALS — BP 132/79 | HR 75 | Temp 96.5°F | Resp 18 | Wt 142.9 lb

## 2015-09-09 DIAGNOSIS — K1231 Oral mucositis (ulcerative) due to antineoplastic therapy: Secondary | ICD-10-CM | POA: Diagnosis not present

## 2015-09-09 DIAGNOSIS — C01 Malignant neoplasm of base of tongue: Secondary | ICD-10-CM | POA: Insufficient documentation

## 2015-09-09 DIAGNOSIS — C099 Malignant neoplasm of tonsil, unspecified: Secondary | ICD-10-CM

## 2015-09-09 DIAGNOSIS — M25552 Pain in left hip: Secondary | ICD-10-CM

## 2015-09-09 DIAGNOSIS — Z5111 Encounter for antineoplastic chemotherapy: Secondary | ICD-10-CM | POA: Insufficient documentation

## 2015-09-09 DIAGNOSIS — C787 Secondary malignant neoplasm of liver and intrahepatic bile duct: Secondary | ICD-10-CM | POA: Insufficient documentation

## 2015-09-09 DIAGNOSIS — Z7689 Persons encountering health services in other specified circumstances: Secondary | ICD-10-CM | POA: Diagnosis not present

## 2015-09-09 DIAGNOSIS — Z79899 Other long term (current) drug therapy: Secondary | ICD-10-CM

## 2015-09-09 DIAGNOSIS — F17210 Nicotine dependence, cigarettes, uncomplicated: Secondary | ICD-10-CM

## 2015-09-09 DIAGNOSIS — E876 Hypokalemia: Secondary | ICD-10-CM | POA: Insufficient documentation

## 2015-09-09 DIAGNOSIS — I10 Essential (primary) hypertension: Secondary | ICD-10-CM

## 2015-09-09 DIAGNOSIS — C7951 Secondary malignant neoplasm of bone: Secondary | ICD-10-CM

## 2015-09-09 DIAGNOSIS — Z9221 Personal history of antineoplastic chemotherapy: Secondary | ICD-10-CM

## 2015-09-09 DIAGNOSIS — C801 Malignant (primary) neoplasm, unspecified: Secondary | ICD-10-CM

## 2015-09-09 DIAGNOSIS — R200 Anesthesia of skin: Secondary | ICD-10-CM | POA: Insufficient documentation

## 2015-09-09 LAB — CBC WITH DIFFERENTIAL/PLATELET
Basophils Absolute: 0 10*3/uL (ref 0–0.1)
Basophils Relative: 0 %
Eosinophils Absolute: 0.1 10*3/uL (ref 0–0.7)
Eosinophils Relative: 1 %
HEMATOCRIT: 37.2 % — AB (ref 40.0–52.0)
HEMOGLOBIN: 13 g/dL (ref 13.0–18.0)
LYMPHS ABS: 1.2 10*3/uL (ref 1.0–3.6)
LYMPHS PCT: 14 %
MCH: 31.9 pg (ref 26.0–34.0)
MCHC: 35 g/dL (ref 32.0–36.0)
MCV: 91.3 fL (ref 80.0–100.0)
MONO ABS: 0.8 10*3/uL (ref 0.2–1.0)
MONOS PCT: 9 %
NEUTROS ABS: 6.9 10*3/uL — AB (ref 1.4–6.5)
Neutrophils Relative %: 76 %
Platelets: 278 10*3/uL (ref 150–440)
RBC: 4.07 MIL/uL — ABNORMAL LOW (ref 4.40–5.90)
RDW: 14.4 % (ref 11.5–14.5)
WBC: 9 10*3/uL (ref 3.8–10.6)

## 2015-09-09 LAB — COMPREHENSIVE METABOLIC PANEL
ALK PHOS: 82 U/L (ref 38–126)
ALT: 15 U/L — ABNORMAL LOW (ref 17–63)
AST: 19 U/L (ref 15–41)
Albumin: 3.9 g/dL (ref 3.5–5.0)
Anion gap: 4 — ABNORMAL LOW (ref 5–15)
BILIRUBIN TOTAL: 0.4 mg/dL (ref 0.3–1.2)
BUN: 12 mg/dL (ref 6–20)
CALCIUM: 8.7 mg/dL — AB (ref 8.9–10.3)
CO2: 26 mmol/L (ref 22–32)
CREATININE: 0.7 mg/dL (ref 0.61–1.24)
Chloride: 101 mmol/L (ref 101–111)
Glucose, Bld: 92 mg/dL (ref 65–99)
Potassium: 4.3 mmol/L (ref 3.5–5.1)
Sodium: 131 mmol/L — ABNORMAL LOW (ref 135–145)
TOTAL PROTEIN: 7.8 g/dL (ref 6.5–8.1)

## 2015-09-09 MED ORDER — PALONOSETRON HCL INJECTION 0.25 MG/5ML
0.2500 mg | Freq: Once | INTRAVENOUS | Status: AC
  Administered 2015-09-09: 0.25 mg via INTRAVENOUS
  Filled 2015-09-09: qty 5

## 2015-09-09 MED ORDER — ONDANSETRON HCL 8 MG PO TABS: 8.0000 mg | ORAL_TABLET | Freq: Three times a day (TID) | ORAL | Status: DC | PRN

## 2015-09-09 MED ORDER — LOPERAMIDE HCL 2 MG PO CAPS: 2.0000 mg | ORAL_CAPSULE | ORAL | Status: DC | PRN

## 2015-09-09 MED ORDER — LORATADINE 10 MG PO TABS: ORAL_TABLET | ORAL | Status: DC

## 2015-09-09 MED ORDER — DOCETAXEL CHEMO INJECTION 160 MG/16ML
75.0000 mg/m2 | Freq: Once | INTRAVENOUS | Status: AC
  Administered 2015-09-09: 130 mg via INTRAVENOUS
  Filled 2015-09-09: qty 13

## 2015-09-09 MED ORDER — PROCHLORPERAZINE MALEATE 10 MG PO TABS: 10.0000 mg | ORAL_TABLET | Freq: Four times a day (QID) | ORAL | Status: DC | PRN

## 2015-09-09 MED ORDER — SODIUM CHLORIDE 0.9 % IV SOLN
Freq: Once | INTRAVENOUS | Status: AC
  Administered 2015-09-09: 10:00:00 via INTRAVENOUS
  Filled 2015-09-09: qty 1000

## 2015-09-09 MED ORDER — CISPLATIN CHEMO INJECTION 100MG/100ML
75.0000 mg/m2 | Freq: Once | INTRAVENOUS | Status: AC
  Administered 2015-09-09: 134 mg via INTRAVENOUS
  Filled 2015-09-09: qty 134

## 2015-09-09 MED ORDER — MANNITOL 25 % IV SOLN
Freq: Once | INTRAVENOUS | Status: AC
  Administered 2015-09-09: 10:00:00 via INTRAVENOUS
  Filled 2015-09-09: qty 1000

## 2015-09-09 MED ORDER — FOSAPREPITANT DIMEGLUMINE INJECTION 150 MG
Freq: Once | INTRAVENOUS | Status: AC
  Administered 2015-09-09: 12:00:00 via INTRAVENOUS
  Filled 2015-09-09: qty 5

## 2015-09-09 MED ORDER — DIPHENOXYLATE-ATROPINE 2.5-0.025 MG PO TABS: 1.0000 | ORAL_TABLET | Freq: Four times a day (QID) | ORAL | Status: DC | PRN

## 2015-09-09 MED ORDER — SODIUM CHLORIDE 0.9% FLUSH
10.0000 mL | INTRAVENOUS | Status: DC | PRN
  Administered 2015-09-09: 10 mL
  Filled 2015-09-09: qty 10

## 2015-09-09 MED ORDER — SODIUM CHLORIDE 0.9 % IV SOLN
750.0000 mg/m2/d | INTRAVENOUS | Status: DC
  Administered 2015-09-09: 6700 mg via INTRAVENOUS
  Filled 2015-09-09: qty 100

## 2015-09-09 NOTE — Progress Notes (Signed)
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  Patient complains of skin itching over the last 2-4 days. Currently resolved. Patient currently denies any nausea vomiting diarrhea. Left hip pain is improving. He is not having difficulty swallowing.   Patient continues to note improvement of his right-sided neck lymphadenopathy improved. No sores in the mouth. Patient is currently in the assisted living.  Appetite good.  REVIEW OF SYSTEMS:  A complete 10 point review of system is done which is negative except mentioned above/history of present illness.   PAST MEDICAL HISTORY :  Past Medical History  Diagnosis Date  . Cancer ([place])   . Hypertension     PAST SURGICAL HISTORY :   Past Surgical History  Procedure Laterality Date  . Peripheral vascular catheterization N/A 08/18/2015    Procedure: Glori Luis Cath Insertion;  Surgeon: Katha Cabal, MD;  Location: [facility];  Service: Cardiovascular;  Laterality: N/A;    FAMILY HISTORY :  No family history on file.  SOCIAL HISTORY:   Social History  Substance Use Topics  . Smoking status: Current Every Day Smoker -- 1.00 packs/day    Types: Cigarettes  . Smokeless tobacco: Not on file  . Alcohol Use: No    ALLERGIES:  has No Known Allergies.  MEDICATIONS:  Current Outpatient Prescriptions  Medication Sig Dispense  Refill  . amLODipine (NORVASC) 5 MG tablet Take 1 tablet (5 mg total) by mouth daily. 60 tablet 1  . lidocaine-prilocaine (EMLA) cream Apply 1 application topically as needed. Apply to port a cath site 1 hours before treatments 30 g 3  . loperamide (IMODIUM) 2 MG capsule Take 1 capsule (2 mg total) by mouth as needed for diarrhea or loose stools. 30 capsule 0  . morphine (MS CONTIN) 30 MG 12 hr tablet Take 1 tablet (30 mg total) by mouth every 12 (twelve) hours. 60 tablet 0  . ondansetron (ZOFRAN) 8 MG tablet Take 1 tablet (8 mg total) by mouth every 8 (eight) hours as needed for nausea or vomiting. 30 tablet 0  . pantoprazole (PROTONIX) 40 MG tablet Take 1 tablet (40 mg total) by mouth daily. 60 tablet 3  . prochlorperazine (COMPAZINE) 10 MG tablet Take 1 tablet (10 mg total) by mouth every 6 (six) hours as needed for nausea or vomiting. 60 tablet 0  . diphenoxylate-atropine (LOMOTIL) 2.5-0.025 MG tablet Take 1 tablet by mouth 4 (four) times daily as needed for diarrhea or loose stools. Take it along with immodium 30 tablet 0  . loratadine (CLARITIN) 10 MG tablet Once a day x10 days; START on the day of chemo. 30 tablet 0   No current facility-administered medications for this visit.    PHYSICAL EXAMINATION: ECOG PERFORMANCE STATUS: 0 - Asymptomatic  BP 132/79 mmHg  Pulse 75  Temp(Src) 96.5 F (35.8 C) (Tympanic)  Resp 18  Wt 142 lb 13.7 oz (64.8 kg)  [employer]  09/09/15 0839  Weight: 142 lb 13.7 oz (64.8 kg)    GENERAL: Well-nourished well-developed; Alert, no distress and comfortable.   Alone.  EYES: no pallor or icterus OROPHARYNX: no thrush; right tonsillar oral ulceration improving.  NECK: supple, no masses felt LYMPH:  2-3 cm Left neck mass improving LUNGS: clear to auscultation and  No wheeze or crackles HEART/CVS: regular rate & rhythm and no murmurs; No lower extremity edema ABDOMEN:abdomen soft, non-tender and normal bowel sounds Musculoskeletal:no cyanosis of  digits and no clubbing  PSYCH: alert & oriented x 3 with fluent speech NEURO: no focal motor/sensory deficits SKIN:  no rashes or significant lesions  LABORATORY DATA:  I have reviewed the data as listed    Component Value Date/Time   NA 131* 09/09/2015 0825   K 4.3 09/09/2015 0825   CL 101 09/09/2015 0825   CO2 26 09/09/2015 0825   GLUCOSE 92 09/09/2015 0825   BUN 12 09/09/2015 0825   CREATININE 0.70 09/09/2015 0825   CALCIUM 8.7* 09/09/2015 0825   PROT 7.8 09/09/2015 0825   ALBUMIN 3.9 09/09/2015 0825   AST 19 09/09/2015 0825   ALT 15* 09/09/2015 0825   ALKPHOS 82 09/09/2015 0825   BILITOT 0.4 09/09/2015 0825   GFRNONAA >60 09/09/2015 0825   GFRAA >60 09/09/2015 0825    No results found for: SPEP, UPEP  Lab Results  Component Value Date   WBC 9.0 09/09/2015   NEUTROABS 6.9* 09/09/2015   HGB 13.0 09/09/2015   HCT 37.2* 09/09/2015   MCV 91.3 09/09/2015   PLT 278 09/09/2015      Chemistry      Component Value Date/Time   NA 131* 09/09/2015 0825   K 4.3 09/09/2015 0825   CL 101 09/09/2015 0825   CO2 26 09/09/2015 0825   BUN 12 09/09/2015 0825   CREATININE 0.70 09/09/2015 0825      Component Value Date/Time   CALCIUM 8.7* 09/09/2015 0825   ALKPHOS 82 09/09/2015 0825   AST 19 09/09/2015 0825   ALT 15* 09/09/2015 0825   BILITOT 0.4 09/09/2015 0825        ASSESSMENT & PLAN:   # 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 Patient tolerated cycle 1 extremely well. Significant clinical response noted.  # Proceed with cycle #2 today; CBC CMP unremarkable.; patient will go home on a 5-FU pump and will come back on April 10 for the pump unhooking/ and also on Pro. We'll plan imaging after 3 cycles.  # Discussed regarding antiemetics and antidiarrheals. New prescriptions given. Also as the patient gives a call if you are any significant issues in the interim.  # Left hip pain  improved- continue MS Contin at this time.  # I will see the patient back on April 17 with labs for follow-up;        Cammie Sickle, MD 09/09/2015 9:01 AM

## 2015-09-14 ENCOUNTER — Inpatient Hospital Stay: Payer: Medicaid Other

## 2015-09-14 VITALS — BP 120/71 | HR 86 | Temp 96.5°F | Resp 18

## 2015-09-14 DIAGNOSIS — C801 Malignant (primary) neoplasm, unspecified: Secondary | ICD-10-CM

## 2015-09-14 DIAGNOSIS — Z5111 Encounter for antineoplastic chemotherapy: Secondary | ICD-10-CM | POA: Diagnosis not present

## 2015-09-14 DIAGNOSIS — C099 Malignant neoplasm of tonsil, unspecified: Secondary | ICD-10-CM

## 2015-09-14 MED ORDER — HEPARIN SOD (PORK) LOCK FLUSH 100 UNIT/ML IV SOLN
500.0000 [IU] | Freq: Once | INTRAVENOUS | Status: AC | PRN
  Administered 2015-09-14: 500 [IU]

## 2015-09-14 MED ORDER — SODIUM CHLORIDE 0.9% FLUSH
10.0000 mL | INTRAVENOUS | Status: DC | PRN
  Administered 2015-09-14: 10 mL
  Filled 2015-09-14: qty 10

## 2015-09-14 MED ORDER — PEGFILGRASTIM 6 MG/0.6ML ~~LOC~~ PSKT
6.0000 mg | PREFILLED_SYRINGE | Freq: Once | SUBCUTANEOUS | Status: AC
  Administered 2015-09-14: 6 mg via SUBCUTANEOUS
  Filled 2015-09-14: qty 0.6

## 2015-09-14 MED ORDER — HEPARIN SOD (PORK) LOCK FLUSH 100 UNIT/ML IV SOLN
INTRAVENOUS | Status: AC
  Filled 2015-09-14: qty 5

## 2015-09-21 ENCOUNTER — Inpatient Hospital Stay: Payer: Medicaid Other

## 2015-09-21 ENCOUNTER — Other Ambulatory Visit: Payer: Self-pay

## 2015-09-21 ENCOUNTER — Ambulatory Visit: Payer: Self-pay | Admitting: Internal Medicine

## 2015-09-21 ENCOUNTER — Inpatient Hospital Stay: Payer: Medicaid Other | Admitting: Internal Medicine

## 2015-09-24 ENCOUNTER — Encounter: Payer: Self-pay | Admitting: Internal Medicine

## 2015-09-24 ENCOUNTER — Inpatient Hospital Stay (HOSPITAL_BASED_OUTPATIENT_CLINIC_OR_DEPARTMENT_OTHER): Payer: Medicaid Other | Admitting: Internal Medicine

## 2015-09-24 ENCOUNTER — Inpatient Hospital Stay: Payer: Medicaid Other

## 2015-09-24 VITALS — BP 142/62 | HR 82 | Temp 97.7°F | Resp 20 | Ht 69.0 in | Wt 134.5 lb

## 2015-09-24 DIAGNOSIS — C787 Secondary malignant neoplasm of liver and intrahepatic bile duct: Secondary | ICD-10-CM

## 2015-09-24 DIAGNOSIS — E876 Hypokalemia: Secondary | ICD-10-CM

## 2015-09-24 DIAGNOSIS — C099 Malignant neoplasm of tonsil, unspecified: Secondary | ICD-10-CM

## 2015-09-24 DIAGNOSIS — K1230 Oral mucositis (ulcerative), unspecified: Secondary | ICD-10-CM

## 2015-09-24 DIAGNOSIS — C7951 Secondary malignant neoplasm of bone: Secondary | ICD-10-CM

## 2015-09-24 DIAGNOSIS — I10 Essential (primary) hypertension: Secondary | ICD-10-CM

## 2015-09-24 DIAGNOSIS — Z79899 Other long term (current) drug therapy: Secondary | ICD-10-CM

## 2015-09-24 DIAGNOSIS — K1231 Oral mucositis (ulcerative) due to antineoplastic therapy: Secondary | ICD-10-CM

## 2015-09-24 DIAGNOSIS — Z5111 Encounter for antineoplastic chemotherapy: Secondary | ICD-10-CM | POA: Diagnosis not present

## 2015-09-24 DIAGNOSIS — F17210 Nicotine dependence, cigarettes, uncomplicated: Secondary | ICD-10-CM

## 2015-09-24 DIAGNOSIS — M25552 Pain in left hip: Secondary | ICD-10-CM

## 2015-09-24 DIAGNOSIS — C01 Malignant neoplasm of base of tongue: Secondary | ICD-10-CM

## 2015-09-24 LAB — CBC WITH DIFFERENTIAL/PLATELET
BASOS ABS: 0.2 10*3/uL — AB (ref 0–0.1)
Basophils Relative: 1 %
Eosinophils Absolute: 0 10*3/uL (ref 0–0.7)
Eosinophils Relative: 0 %
HEMATOCRIT: 33 % — AB (ref 40.0–52.0)
Hemoglobin: 11.1 g/dL — ABNORMAL LOW (ref 13.0–18.0)
LYMPHS ABS: 1.1 10*3/uL (ref 1.0–3.6)
LYMPHS PCT: 7 %
MCH: 30.7 pg (ref 26.0–34.0)
MCHC: 33.8 g/dL (ref 32.0–36.0)
MCV: 90.7 fL (ref 80.0–100.0)
MONO ABS: 0.8 10*3/uL (ref 0.2–1.0)
Monocytes Relative: 5 %
NEUTROS ABS: 13.7 10*3/uL — AB (ref 1.4–6.5)
Neutrophils Relative %: 87 %
PLATELETS: 179 10*3/uL (ref 150–440)
RBC: 3.64 MIL/uL — AB (ref 4.40–5.90)
RDW: 15.5 % — AB (ref 11.5–14.5)
WBC: 15.8 10*3/uL — ABNORMAL HIGH (ref 3.8–10.6)

## 2015-09-24 LAB — BASIC METABOLIC PANEL
ANION GAP: 3 — AB (ref 5–15)
BUN: 14 mg/dL (ref 6–20)
CALCIUM: 8.2 mg/dL — AB (ref 8.9–10.3)
CHLORIDE: 103 mmol/L (ref 101–111)
CO2: 27 mmol/L (ref 22–32)
Creatinine, Ser: 0.84 mg/dL (ref 0.61–1.24)
GFR calc non Af Amer: >60 mL/min (ref >60)
GLUCOSE: 136 mg/dL — AB (ref 65–99)
Potassium: 3 mmol/L — ABNORMAL LOW (ref 3.5–5.1)
Sodium: 133 mmol/L — ABNORMAL LOW (ref 135–145)

## 2015-09-24 LAB — MAGNESIUM: Magnesium: 1.8 mg/dL (ref 1.7–2.4)

## 2015-09-24 MED ORDER — MAGIC MOUTHWASH W/LIDOCAINE: 5.0000 mL | Freq: Four times a day (QID) | ORAL | Status: AC | PRN

## 2015-09-24 MED ORDER — POTASSIUM CHLORIDE CRYS ER 20 MEQ PO TBCR: 40.0000 meq | EXTENDED_RELEASE_TABLET | Freq: Two times a day (BID) | ORAL | Status: DC

## 2015-09-24 NOTE — Progress Notes (Signed)
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  Patient had significant source in the mouth 3-4 days after the chemotherapy. Unable to eat. Unfortunately did not call. Lost about 9 pounds. Currently the sores of improved. Appetite is good. Patient currently denies any nausea vomiting diarrhea.   Patient continues to note improvement of his right-sided neck lymphadenopathy improved.  Patient is currently in the assisted living.  Denies any tingling and numbness. Denies any leg swelling  REVIEW OF SYSTEMS:  A complete 10 point review of system is done which is negative except mentioned above/history of present illness.   PAST MEDICAL HISTORY :  Past Medical History  Diagnosis Date  . Cancer ([place])   . Hypertension     PAST SURGICAL HISTORY :   Past Surgical History  Procedure Laterality Date  . Peripheral vascular catheterization N/A 08/18/2015    Procedure: Glori Luis Cath Insertion;  Surgeon: Katha Cabal, MD;  Location: [facility];  Service: Cardiovascular;  Laterality: N/A;    FAMILY HISTORY :  No family history on file.  SOCIAL HISTORY:   Social History  Substance Use Topics  . Smoking status: Current Every Day Smoker -- 1.00 packs/day    Types: Cigarettes  . Smokeless tobacco: Not on file  . Alcohol Use: No    ALLERGIES:  has No Known Allergies.  MEDICATIONS:  Current  Outpatient Prescriptions  Medication Sig Dispense Refill  . amLODipine (NORVASC) 5 MG tablet Take 1 tablet (5 mg total) by mouth daily. 60 tablet 1  . diphenoxylate-atropine (LOMOTIL) 2.5-0.025 MG tablet Take 1 tablet by mouth 4 (four) times daily as needed for diarrhea or loose stools. Take it along with immodium 30 tablet 0  . lidocaine-prilocaine (EMLA) cream Apply 1 application topically as needed. Apply to port a cath site 1 hours before treatments 30 g 3  . loperamide (IMODIUM) 2 MG capsule Take 1 capsule (2 mg total) by mouth as needed for diarrhea or loose stools. 30 capsule 0  . loratadine (CLARITIN) 10 MG tablet Once a day x10 days; START on the day of chemo. 30 tablet 0  . morphine (MS CONTIN) 30 MG 12 hr tablet Take 1 tablet (30 mg total) by mouth every 12 (twelve) hours. 60 tablet 0  . ondansetron (ZOFRAN) 8 MG tablet Take 1 tablet (8 mg total) by mouth every 8 (eight) hours as needed for nausea or vomiting. 30 tablet 0  . prochlorperazine (COMPAZINE) 10 MG tablet Take 1 tablet (10 mg total) by mouth every 6 (six) hours as needed for nausea or vomiting. 60 tablet 0  . ranitidine (ZANTAC) 75 MG tablet Take 75 mg by mouth daily.    . pantoprazole (PROTONIX) 40 MG tablet Take 1 tablet (40 mg total) by mouth daily. (Patient not taking: Reported on 09/24/2015) 60 tablet 3   No current facility-administered medications for this visit.    PHYSICAL EXAMINATION: ECOG PERFORMANCE STATUS: 0 -  Asymptomatic  BP 142/62 mmHg  Pulse 82  Temp(Src) 97.7 F (36.5 C)  Resp 20  Ht 5\' 9"  (1.753 m)  Wt 134 lb 7.7 oz (61 kg)  BMI 19.85 kg/m2  SpO2 98%  Filed Weights   09/24/15 0833  Weight: 134 lb 7.7 oz (61 kg)    GENERAL: Well-nourished well-developed; Alert, no distress and comfortable.   Alone.  EYES: no pallor or icterus OROPHARYNX: no thrush; right tonsillar oral ulceration improving.  NECK: supple, no masses felt LYMPH:  2 cmRight neck mass felt.[ Significant improvement.] LUNGS:  clear to auscultation and  No wheeze or crackles HEART/CVS: regular rate & rhythm and no murmurs; No lower extremity edema ABDOMEN:abdomen soft, non-tender and normal bowel sounds Musculoskeletal:no cyanosis of digits and no clubbing  PSYCH: alert & oriented x 3 with fluent speech NEURO: no focal motor/sensory deficits SKIN:  no rashes or significant lesions  LABORATORY DATA:  I have reviewed the data as listed    Component Value Date/Time   NA 131* 09/09/2015 0825   K 4.3 09/09/2015 0825   CL 101 09/09/2015 0825   CO2 26 09/09/2015 0825   GLUCOSE 92 09/09/2015 0825   BUN 12 09/09/2015 0825   CREATININE 0.70 09/09/2015 0825   CALCIUM 8.7* 09/09/2015 0825   PROT 7.8 09/09/2015 0825   ALBUMIN 3.9 09/09/2015 0825   AST 19 09/09/2015 0825   ALT 15* 09/09/2015 0825   ALKPHOS 82 09/09/2015 0825   BILITOT 0.4 09/09/2015 0825   GFRNONAA >60 09/09/2015 0825   GFRAA >60 09/09/2015 0825    No results found for: SPEP, UPEP  Lab Results  Component Value Date   WBC 15.8* 09/24/2015   NEUTROABS 13.7* 09/24/2015   HGB 11.1* 09/24/2015   HCT 33.0* 09/24/2015   MCV 90.7 09/24/2015   PLT 179 09/24/2015      Chemistry      Component Value Date/Time   NA 131* 09/09/2015 0825   K 4.3 09/09/2015 0825   CL 101 09/09/2015 0825   CO2 26 09/09/2015 0825   BUN 12 09/09/2015 0825   CREATININE 0.70 09/09/2015 0825      Component Value Date/Time   CALCIUM 8.7* 09/09/2015 0825   ALKPHOS 82 09/09/2015 0825   AST 19 09/09/2015 0825   ALT 15* 09/09/2015 0825   BILITOT 0.4 09/09/2015 0825        ASSESSMENT & PLAN:   # 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 Patient tolerated cycle #2 with mild to moderate difficulties [C discussion below]. Patient had significant improvement on current chemotherapy.  # Will proceed with cycle #3 on April 26. We will cut down the dose of cisplatin to 60 mg/m; also  docetaxel to 60 mg/m. CBC unremarkable. BNP pending. We'll plan to get a imaging after 3 cycles.  # Oral mucositis from chemotherapy- discussed regarding management mouthwash. Given a new prescription.  # Hypokalemia potassium 3.0; 20 MG Q twice a day.   # Discussed regarding antiemetics and antidiarrheals. Fortunately no side effects noted  # Left hip pain improved- continue MS Contin at this time.  # I will see the patient back on April 26th/ labs proceed with chemotherapy cycle #3 at the time.     Cammie Sickle, MD 09/24/2015 8:39 AM

## 2015-09-30 ENCOUNTER — Inpatient Hospital Stay: Payer: Medicaid Other

## 2015-09-30 ENCOUNTER — Inpatient Hospital Stay (HOSPITAL_BASED_OUTPATIENT_CLINIC_OR_DEPARTMENT_OTHER): Payer: Medicaid Other | Admitting: Internal Medicine

## 2015-09-30 VITALS — BP 121/66 | HR 92 | Temp 97.8°F | Resp 20 | Wt 140.2 lb

## 2015-09-30 DIAGNOSIS — R200 Anesthesia of skin: Secondary | ICD-10-CM

## 2015-09-30 DIAGNOSIS — C801 Malignant (primary) neoplasm, unspecified: Secondary | ICD-10-CM

## 2015-09-30 DIAGNOSIS — F17210 Nicotine dependence, cigarettes, uncomplicated: Secondary | ICD-10-CM

## 2015-09-30 DIAGNOSIS — G629 Polyneuropathy, unspecified: Secondary | ICD-10-CM

## 2015-09-30 DIAGNOSIS — Z79899 Other long term (current) drug therapy: Secondary | ICD-10-CM

## 2015-09-30 DIAGNOSIS — C01 Malignant neoplasm of base of tongue: Secondary | ICD-10-CM

## 2015-09-30 DIAGNOSIS — E876 Hypokalemia: Secondary | ICD-10-CM

## 2015-09-30 DIAGNOSIS — C787 Secondary malignant neoplasm of liver and intrahepatic bile duct: Secondary | ICD-10-CM

## 2015-09-30 DIAGNOSIS — Z5111 Encounter for antineoplastic chemotherapy: Secondary | ICD-10-CM | POA: Diagnosis not present

## 2015-09-30 DIAGNOSIS — C7951 Secondary malignant neoplasm of bone: Secondary | ICD-10-CM

## 2015-09-30 DIAGNOSIS — M25552 Pain in left hip: Secondary | ICD-10-CM

## 2015-09-30 DIAGNOSIS — C099 Malignant neoplasm of tonsil, unspecified: Secondary | ICD-10-CM

## 2015-09-30 LAB — CBC WITH DIFFERENTIAL/PLATELET
BASOS PCT: 1 %
Basophils Absolute: 0.1 10*3/uL (ref 0–0.1)
Eosinophils Absolute: 0 10*3/uL (ref 0–0.7)
Eosinophils Relative: 0 %
HCT: 32.2 % — ABNORMAL LOW (ref 40.0–52.0)
Hemoglobin: 11 g/dL — ABNORMAL LOW (ref 13.0–18.0)
Lymphocytes Relative: 15 %
Lymphs Abs: 1.7 10*3/uL (ref 1.0–3.6)
MCH: 31.4 pg (ref 26.0–34.0)
MCHC: 34.4 g/dL (ref 32.0–36.0)
MCV: 91.3 fL (ref 80.0–100.0)
MONOS PCT: 9 %
Monocytes Absolute: 1.1 10*3/uL — ABNORMAL HIGH (ref 0.2–1.0)
NEUTROS ABS: 8.8 10*3/uL — AB (ref 1.4–6.5)
NEUTROS PCT: 75 %
PLATELETS: 317 10*3/uL (ref 150–440)
RBC: 3.52 MIL/uL — ABNORMAL LOW (ref 4.40–5.90)
RDW: 15.7 % — ABNORMAL HIGH (ref 11.5–14.5)
WBC: 11.8 10*3/uL — AB (ref 3.8–10.6)

## 2015-09-30 LAB — COMPREHENSIVE METABOLIC PANEL
ALT: 10 U/L — ABNORMAL LOW (ref 17–63)
ANION GAP: 6 (ref 5–15)
AST: 16 U/L (ref 15–41)
Albumin: 3.3 g/dL — ABNORMAL LOW (ref 3.5–5.0)
Alkaline Phosphatase: 73 U/L (ref 38–126)
BILIRUBIN TOTAL: 0.4 mg/dL (ref 0.3–1.2)
BUN: 12 mg/dL (ref 6–20)
CALCIUM: 8.8 mg/dL — AB (ref 8.9–10.3)
CO2: 23 mmol/L (ref 22–32)
Chloride: 104 mmol/L (ref 101–111)
Creatinine, Ser: 0.72 mg/dL (ref 0.61–1.24)
Glucose, Bld: 118 mg/dL — ABNORMAL HIGH (ref 65–99)
Potassium: 4.3 mmol/L (ref 3.5–5.1)
SODIUM: 133 mmol/L — AB (ref 135–145)
TOTAL PROTEIN: 7.4 g/dL (ref 6.5–8.1)

## 2015-09-30 LAB — MAGNESIUM: MAGNESIUM: 1.7 mg/dL (ref 1.7–2.4)

## 2015-09-30 MED ORDER — HEPARIN SOD (PORK) LOCK FLUSH 100 UNIT/ML IV SOLN
500.0000 [IU] | Freq: Once | INTRAVENOUS | Status: DC
Start: 2015-09-30 — End: 2015-09-30

## 2015-09-30 MED ORDER — GABAPENTIN 300 MG PO CAPS: 300.0000 mg | ORAL_CAPSULE | Freq: Three times a day (TID) | ORAL | Status: DC

## 2015-09-30 MED ORDER — SODIUM CHLORIDE 0.9 % IV SOLN
Freq: Once | INTRAVENOUS | Status: AC
  Administered 2015-09-30: 10:00:00 via INTRAVENOUS
  Filled 2015-09-30: qty 1000

## 2015-09-30 MED ORDER — SODIUM CHLORIDE 0.9 % IV SOLN
60.0000 mg/m2 | Freq: Once | INTRAVENOUS | Status: AC
  Administered 2015-09-30: 107 mg via INTRAVENOUS
  Filled 2015-09-30: qty 107

## 2015-09-30 MED ORDER — POTASSIUM CHLORIDE 2 MEQ/ML IV SOLN
Freq: Once | INTRAVENOUS | Status: AC
  Administered 2015-09-30: 10:00:00 via INTRAVENOUS
  Filled 2015-09-30: qty 10

## 2015-09-30 MED ORDER — FLUOROURACIL CHEMO INJECTION 5 GM/100ML
750.0000 mg/m2/d | INTRAVENOUS | Status: DC
  Administered 2015-09-30: 6700 mg via INTRAVENOUS
  Filled 2015-09-30: qty 134

## 2015-09-30 MED ORDER — PALONOSETRON HCL INJECTION 0.25 MG/5ML
0.2500 mg | Freq: Once | INTRAVENOUS | Status: AC
  Administered 2015-09-30: 0.25 mg via INTRAVENOUS
  Filled 2015-09-30: qty 5

## 2015-09-30 MED ORDER — FOSAPREPITANT DIMEGLUMINE INJECTION 150 MG
Freq: Once | INTRAVENOUS | Status: AC
  Administered 2015-09-30: 12:00:00 via INTRAVENOUS
  Filled 2015-09-30: qty 5

## 2015-09-30 MED ORDER — SODIUM CHLORIDE 0.9% FLUSH
10.0000 mL | Freq: Once | INTRAVENOUS | Status: AC
  Administered 2015-09-30: 10 mL via INTRAVENOUS
  Filled 2015-09-30: qty 10

## 2015-09-30 NOTE — Progress Notes (Signed)
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  Patient complains of significant pain/left lower extremity tingling and numbness over the last few days. Also noted to have some weakness in the left lower leg.   Otherwise appetite improving. No lumps or bumps. No sores in the mouth. Gaining weight. No nausea no vomiting diarrhea.  Patient continues to note improvement of his right-sided neck lymphadenopathy improved.  Patient is currently in the assisted living.   REVIEW OF SYSTEMS:  A complete 10 point review of system is done which is negative except mentioned above/history of present illness.   PAST MEDICAL HISTORY :  Past Medical History  Diagnosis Date  . Cancer ([place])   . Hypertension     PAST SURGICAL HISTORY :   Past Surgical History  Procedure Laterality Date  . Peripheral vascular catheterization N/A 08/18/2015    Procedure: Glori Luis Cath Insertion;  Surgeon: Katha Cabal, MD;  Location: [facility];  Service: Cardiovascular;  Laterality: N/A;    FAMILY HISTORY :  No family history on file.  SOCIAL HISTORY:   Social History  Substance Use Topics  . Smoking status: Current Every Day Smoker -- 1.00 packs/day    Types: Cigarettes  . Smokeless tobacco: Not on file  . Alcohol Use: No    ALLERGIES:  has No Known  Allergies.  MEDICATIONS:  Current Outpatient Prescriptions  Medication Sig Dispense Refill  . amLODipine (NORVASC) 5 MG tablet Take 1 tablet (5 mg total) by mouth daily. 60 tablet 1  . diphenoxylate-atropine (LOMOTIL) 2.5-0.025 MG tablet Take 1 tablet by mouth 4 (four) times daily as needed for diarrhea or loose stools. Take it along with immodium 30 tablet 0  . lidocaine-prilocaine (EMLA) cream Apply 1 application topically as needed. Apply to port a cath site 1 hours before treatments 30 g 3  . loperamide (IMODIUM) 2 MG capsule Take 1 capsule (2 mg total) by mouth as needed for diarrhea or loose stools. 30 capsule 0  . loratadine (CLARITIN) 10 MG tablet Once a day x10 days; START on the day of chemo. 30 tablet 0  . magic mouthwash w/lidocaine SOLN Take 5 mLs by mouth 4 (four) times daily as needed for mouth pain. 250 mL 3  . morphine (MS CONTIN) 30 MG 12 hr tablet Take 1 tablet (30 mg total) by mouth every 12 (twelve) hours. 60 tablet 0  . ondansetron (ZOFRAN) 8 MG tablet Take 1 tablet (8 mg total) by mouth every 8 (eight) hours as needed for nausea or vomiting. 30 tablet 0  . pantoprazole (PROTONIX) 40 MG tablet Take 1 tablet (40 mg total) by mouth daily. 60 tablet 3  . potassium chloride SA (K-DUR,KLOR-CON) 20 MEQ tablet Take 2 tablets (40 mEq total) by mouth 2 (two) times daily. 30 tablet 3  . prochlorperazine (COMPAZINE) 10 MG tablet  Take 1 tablet (10 mg total) by mouth every 6 (six) hours as needed for nausea or vomiting. 60 tablet 0  . ranitidine (ZANTAC) 75 MG tablet Take 75 mg by mouth daily.    Marland Kitchen gabapentin (NEURONTIN) 300 MG capsule Take 1 capsule (300 mg total) by mouth 3 (three) times daily. 90 capsule 3   No current facility-administered medications for this visit.   Facility-Administered Medications Ordered in Other Visits  Medication Dose Route Frequency Provider Last Rate Last Dose  . heparin lock flush 100 unit/mL  500 Units Intravenous Once Cammie Sickle, MD         PHYSICAL EXAMINATION: ECOG PERFORMANCE STATUS: 0 - Asymptomatic  BP 121/66 mmHg  Pulse 92  Temp(Src) 97.8 F (36.6 C) (Tympanic)  Resp 20  Wt 140 lb 3.4 oz (63.6 kg)  Filed Weights   09/30/15 0856  Weight: 140 lb 3.4 oz (63.6 kg)    GENERAL: Well-nourished well-developed; Alert, no distress and comfortable.   Alone.  EYES: no pallor or icterus OROPHARYNX: no thrush; right tonsillar oral ulceration improving.  NECK: supple, no masses felt LYMPH:  2 cmRight neck mass felt.[ Significant improvement.] LUNGS: clear to auscultation and  No wheeze or crackles HEART/CVS: regular rate & rhythm and no murmurs; No lower extremity edema ABDOMEN:abdomen soft, non-tender and normal bowel sounds Musculoskeletal:no cyanosis of digits and no clubbing  PSYCH: alert & oriented x 3 with fluent speech NEURO: no focal motor/sensory deficits; mild foot drop noted in Left LE.  SKIN:  no rashes or significant lesions  LABORATORY DATA:  I have reviewed the data as listed    Component Value Date/Time   NA 133* 09/30/2015 0810   K 4.3 09/30/2015 0810   CL 104 09/30/2015 0810   CO2 23 09/30/2015 0810   GLUCOSE 118* 09/30/2015 0810   BUN 12 09/30/2015 0810   CREATININE 0.72 09/30/2015 0810   CALCIUM 8.8* 09/30/2015 0810   PROT 7.4 09/30/2015 0810   ALBUMIN 3.3* 09/30/2015 0810   AST 16 09/30/2015 0810   ALT 10* 09/30/2015 0810   ALKPHOS 73 09/30/2015 0810   BILITOT 0.4 09/30/2015 0810   GFRNONAA >60 09/30/2015 0810   GFRAA >60 09/30/2015 0810    No results found for: SPEP, UPEP  Lab Results  Component Value Date   WBC 11.8* 09/30/2015   NEUTROABS 8.8* 09/30/2015   HGB 11.0* 09/30/2015   HCT 32.2* 09/30/2015   MCV 91.3 09/30/2015   PLT 317 09/30/2015      Chemistry      Component Value Date/Time   NA 133* 09/30/2015 0810   K 4.3 09/30/2015 0810   CL 104 09/30/2015 0810   CO2 23 09/30/2015 0810   BUN 12 09/30/2015 0810   CREATININE 0.72 09/30/2015 0810      Component  Value Date/Time   CALCIUM 8.8* 09/30/2015 0810   ALKPHOS 73 09/30/2015 0810   AST 16 09/30/2015 0810   ALT 10* 09/30/2015 0810   BILITOT 0.4 09/30/2015 0810        ASSESSMENT & PLAN:   # 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 Patient tolerated cycle #2 with mild to moderate difficulties [C discussion below]. Patient had significant improvement on current chemotherapy.  # Will proceed with cycle #3 today.  We will cut down the dose of cisplatin to 60 mg/m Hold Taxotere [C discussion below]. CBC BMP unremarkable.   # Left lower extremity  pain/tingling and numbness- mild footdrop noted. Discontinue Taxotere. At Neurontin 300 mg 3 times a day  # Oral mucositis from chemotherapy- resolved.  # Hypokalemia potassium 3.0; 20 MG Q twice a day.   # Left hip pain improved- continue MS Contin 30 twice a day at this time.  # we'll see the patient back in 10 days/week Will plan scan to be ordered at that time.     Cammie Sickle, MD 09/30/2015 9:25 AM

## 2015-10-05 ENCOUNTER — Inpatient Hospital Stay: Payer: Medicaid Other | Attending: Internal Medicine

## 2015-10-05 ENCOUNTER — Inpatient Hospital Stay: Payer: Medicaid Other

## 2015-10-05 VITALS — BP 127/80 | HR 96 | Temp 96.9°F | Resp 18

## 2015-10-05 DIAGNOSIS — Z79899 Other long term (current) drug therapy: Secondary | ICD-10-CM | POA: Diagnosis not present

## 2015-10-05 DIAGNOSIS — C787 Secondary malignant neoplasm of liver and intrahepatic bile duct: Secondary | ICD-10-CM | POA: Insufficient documentation

## 2015-10-05 DIAGNOSIS — C099 Malignant neoplasm of tonsil, unspecified: Secondary | ICD-10-CM

## 2015-10-05 DIAGNOSIS — Z7689 Persons encountering health services in other specified circumstances: Secondary | ICD-10-CM | POA: Diagnosis not present

## 2015-10-05 DIAGNOSIS — F17210 Nicotine dependence, cigarettes, uncomplicated: Secondary | ICD-10-CM | POA: Insufficient documentation

## 2015-10-05 DIAGNOSIS — M25552 Pain in left hip: Secondary | ICD-10-CM | POA: Diagnosis not present

## 2015-10-05 DIAGNOSIS — B370 Candidal stomatitis: Secondary | ICD-10-CM | POA: Diagnosis not present

## 2015-10-05 DIAGNOSIS — E876 Hypokalemia: Secondary | ICD-10-CM | POA: Diagnosis not present

## 2015-10-05 DIAGNOSIS — C01 Malignant neoplasm of base of tongue: Secondary | ICD-10-CM | POA: Diagnosis present

## 2015-10-05 DIAGNOSIS — R200 Anesthesia of skin: Secondary | ICD-10-CM | POA: Insufficient documentation

## 2015-10-05 DIAGNOSIS — C7951 Secondary malignant neoplasm of bone: Secondary | ICD-10-CM | POA: Insufficient documentation

## 2015-10-05 DIAGNOSIS — I10 Essential (primary) hypertension: Secondary | ICD-10-CM | POA: Insufficient documentation

## 2015-10-05 DIAGNOSIS — C801 Malignant (primary) neoplasm, unspecified: Secondary | ICD-10-CM

## 2015-10-05 MED ORDER — HEPARIN SOD (PORK) LOCK FLUSH 100 UNIT/ML IV SOLN
500.0000 [IU] | Freq: Once | INTRAVENOUS | Status: AC | PRN
  Administered 2015-10-05: 500 [IU]
  Filled 2015-10-05: qty 5

## 2015-10-05 MED ORDER — SODIUM CHLORIDE 0.9% FLUSH
10.0000 mL | INTRAVENOUS | Status: DC | PRN
  Administered 2015-10-05: 10 mL
  Filled 2015-10-05: qty 10

## 2015-10-05 MED ORDER — PEGFILGRASTIM 6 MG/0.6ML ~~LOC~~ PSKT
6.0000 mg | PREFILLED_SYRINGE | Freq: Once | SUBCUTANEOUS | Status: AC
  Administered 2015-10-05: 6 mg via SUBCUTANEOUS
  Filled 2015-10-05: qty 0.6

## 2015-10-09 ENCOUNTER — Telehealth: Payer: Self-pay | Admitting: *Deleted

## 2015-10-09 NOTE — Telephone Encounter (Signed)
Patient states he is having neuropathy leg pain and wants to know if prescription can be faxed to [facility] where he is staying?

## 2015-10-09 NOTE — Telephone Encounter (Signed)
Spoke with [facility] and she states the patient has received the Gabapentin as scheduled.

## 2015-10-14 ENCOUNTER — Inpatient Hospital Stay: Payer: Medicaid Other

## 2015-10-14 ENCOUNTER — Inpatient Hospital Stay (HOSPITAL_BASED_OUTPATIENT_CLINIC_OR_DEPARTMENT_OTHER): Payer: Medicaid Other | Admitting: Internal Medicine

## 2015-10-14 VITALS — BP 138/88 | HR 90 | Temp 97.5°F | Resp 18 | Wt 140.2 lb

## 2015-10-14 DIAGNOSIS — C01 Malignant neoplasm of base of tongue: Secondary | ICD-10-CM | POA: Diagnosis not present

## 2015-10-14 DIAGNOSIS — F17210 Nicotine dependence, cigarettes, uncomplicated: Secondary | ICD-10-CM

## 2015-10-14 DIAGNOSIS — C787 Secondary malignant neoplasm of liver and intrahepatic bile duct: Secondary | ICD-10-CM

## 2015-10-14 DIAGNOSIS — C099 Malignant neoplasm of tonsil, unspecified: Secondary | ICD-10-CM

## 2015-10-14 DIAGNOSIS — G629 Polyneuropathy, unspecified: Secondary | ICD-10-CM

## 2015-10-14 DIAGNOSIS — M25552 Pain in left hip: Secondary | ICD-10-CM

## 2015-10-14 DIAGNOSIS — Z79899 Other long term (current) drug therapy: Secondary | ICD-10-CM

## 2015-10-14 DIAGNOSIS — B370 Candidal stomatitis: Secondary | ICD-10-CM

## 2015-10-14 DIAGNOSIS — R200 Anesthesia of skin: Secondary | ICD-10-CM

## 2015-10-14 DIAGNOSIS — E876 Hypokalemia: Secondary | ICD-10-CM

## 2015-10-14 DIAGNOSIS — C7951 Secondary malignant neoplasm of bone: Secondary | ICD-10-CM

## 2015-10-14 LAB — CBC WITH DIFFERENTIAL/PLATELET
BASOS PCT: 1 %
Basophils Absolute: 0.1 10*3/uL (ref 0–0.1)
EOS ABS: 0.4 10*3/uL (ref 0–0.7)
Eosinophils Relative: 2 %
HCT: 34.6 % — ABNORMAL LOW (ref 40.0–52.0)
HEMOGLOBIN: 11.8 g/dL — AB (ref 13.0–18.0)
Lymphocytes Relative: 10 %
Lymphs Abs: 1.8 10*3/uL (ref 1.0–3.6)
MCH: 32.1 pg (ref 26.0–34.0)
MCHC: 34 g/dL (ref 32.0–36.0)
MCV: 94.4 fL (ref 80.0–100.0)
Monocytes Absolute: 1.1 10*3/uL — ABNORMAL HIGH (ref 0.2–1.0)
Monocytes Relative: 7 %
NEUTROS PCT: 80 %
Neutro Abs: 13.6 10*3/uL — ABNORMAL HIGH (ref 1.4–6.5)
Platelets: 201 10*3/uL (ref 150–440)
RBC: 3.67 MIL/uL — AB (ref 4.40–5.90)
RDW: 19 % — ABNORMAL HIGH (ref 11.5–14.5)
WBC: 17 10*3/uL — AB (ref 3.8–10.6)

## 2015-10-14 LAB — BASIC METABOLIC PANEL
Anion gap: 5 (ref 5–15)
BUN: 13 mg/dL (ref 6–20)
CHLORIDE: 103 mmol/L (ref 101–111)
CO2: 28 mmol/L (ref 22–32)
CREATININE: 0.71 mg/dL (ref 0.61–1.24)
Calcium: 9.2 mg/dL (ref 8.9–10.3)
GFR calc non Af Amer: >60 mL/min (ref >60)
Glucose, Bld: 88 mg/dL (ref 65–99)
Potassium: 3.8 mmol/L (ref 3.5–5.1)
SODIUM: 136 mmol/L (ref 135–145)

## 2015-10-14 LAB — MAGNESIUM: MAGNESIUM: 1.9 mg/dL (ref 1.7–2.4)

## 2015-10-14 MED ORDER — NYSTATIN 100000 UNIT/ML MT SUSP: 5.0000 mL | Freq: Four times a day (QID) | OROMUCOSAL | Status: DC

## 2015-10-14 MED ORDER — GABAPENTIN 300 MG PO CAPS: 600.0000 mg | ORAL_CAPSULE | Freq: Three times a day (TID) | ORAL | Status: DC

## 2015-10-14 NOTE — Progress Notes (Signed)
For 1 week after treatment patient has loss of appetite and mouth sores.  The neuropathy is not improving and would like to discuss possibly increasing Gabapentin because when he first initiated the Gabapentin he did have relieve.

## 2015-10-14 NOTE — Progress Notes (Signed)
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  Patient complains of significant pain/left lower extremity tingling and numbness over the last few days. Also noted to have some weakness in the left lower leg. He is having difficulty walking. He is stumbling. Continues to have pain in the left lower extremity not improved with Neurontin 300 mg 3 times a day and morphine long-acting.  Otherwise appetite improving. No lumps or bumps. No sores in the mouth. Gaining weight. No nausea no vomiting diarrhea. Denies any new lumps or bumps.  Patient is currently in the assisted living.   REVIEW OF SYSTEMS:  A complete 10 point review of system is done which is negative except mentioned above/history of present illness.   PAST MEDICAL HISTORY :  Past Medical History  Diagnosis Date  . Cancer ([place])   . Hypertension     PAST SURGICAL HISTORY :   Past Surgical History  Procedure Laterality Date  . Peripheral vascular catheterization N/A 08/18/2015    Procedure: Glori Luis Cath Insertion;  Surgeon: Katha Cabal, MD;  Location: [facility];  Service: Cardiovascular;  Laterality: N/A;    FAMILY HISTORY :  No family history on file.  SOCIAL HISTORY:   Social History  Substance Use Topics  . Smoking status: Current Every Day Smoker -- 1.00  packs/day    Types: Cigarettes  . Smokeless tobacco: Not on file  . Alcohol Use: No    ALLERGIES:  has No Known Allergies.  MEDICATIONS:  Current Outpatient Prescriptions  Medication Sig Dispense Refill  . amLODipine (NORVASC) 5 MG tablet Take 1 tablet (5 mg total) by mouth daily. 60 tablet 1  . diphenoxylate-atropine (LOMOTIL) 2.5-0.025 MG tablet Take 1 tablet by mouth 4 (four) times daily as needed for diarrhea or loose stools. Take it along with immodium 30 tablet 0  . gabapentin (NEURONTIN) 300 MG capsule Take 2 capsules (600 mg total) by mouth 3 (three) times daily. 180 capsule 3  . lidocaine-prilocaine (EMLA) cream Apply 1 application topically as needed. Apply to port a cath site 1 hours before treatments 30 g 3  . loperamide (IMODIUM) 2 MG capsule Take 1 capsule (2 mg total) by mouth as needed for diarrhea or loose stools. 30 capsule 0  . loratadine (CLARITIN) 10 MG tablet Once a day x10 days; START on the day of chemo. 30 tablet 0  . magic mouthwash w/lidocaine SOLN Take 5 mLs by mouth 4 (four) times daily as needed for mouth pain. 250 mL 3  . morphine (MS CONTIN) 30 MG 12 hr tablet Take 1 tablet (30 mg total) by mouth every 12 (twelve) hours. 60 tablet 0  . ondansetron (ZOFRAN) 8 MG tablet Take 1 tablet (8 mg total) by mouth every 8 (eight) hours as needed for nausea or vomiting. 30 tablet 0  . pantoprazole (PROTONIX) 40  MG tablet Take 1 tablet (40 mg total) by mouth daily. 60 tablet 3  . potassium chloride SA (K-DUR,KLOR-CON) 20 MEQ tablet Take 2 tablets (40 mEq total) by mouth 2 (two) times daily. 30 tablet 3  . prochlorperazine (COMPAZINE) 10 MG tablet Take 1 tablet (10 mg total) by mouth every 6 (six) hours as needed for nausea or vomiting. 60 tablet 0  . ranitidine (ZANTAC) 75 MG tablet Take 75 mg by mouth daily.    Marland Kitchen nystatin (MYCOSTATIN) 100000 UNIT/ML suspension Take 5 mLs (500,000 Units total) by mouth 4 (four) times daily. 60 mL 0   No current facility-administered  medications for this visit.    PHYSICAL EXAMINATION: ECOG PERFORMANCE STATUS: 0 - Asymptomatic  BP 138/88 mmHg  Pulse 90  Temp(Src) 97.5 F (36.4 C) (Tympanic)  Resp 18  Wt 140 lb 3.4 oz (63.6 kg)  Filed Weights   10/14/15 1052  Weight: 140 lb 3.4 oz (63.6 kg)    GENERAL: Well-nourished well-developed; Alert, no distress and comfortable.   Alone.  EYES: no pallor or icterus OROPHARYNX:Thrush noted in the posterior pharyngeal wall. right tonsillar oral ulceration improving.  NECK: supple, no masses felt LYMPH: No lymphadenopathy noted. LUNGS: clear to auscultation and  No wheeze or crackles HEART/CVS: regular rate & rhythm and no murmurs; No lower extremity edema ABDOMEN:abdomen soft, non-tender and normal bowel sounds Musculoskeletal:no cyanosis of digits and no clubbing  PSYCH: alert & oriented x 3 with fluent speech NEURO: no focal motor/sensory deficits; mild foot drop noted in Left LE.  SKIN:  no rashes or significant lesions  LABORATORY DATA:  I have reviewed the data as listed    Component Value Date/Time   NA 136 10/14/2015 0916   K 3.8 10/14/2015 0916   CL 103 10/14/2015 0916   CO2 28 10/14/2015 0916   GLUCOSE 88 10/14/2015 0916   BUN 13 10/14/2015 0916   CREATININE 0.71 10/14/2015 0916   CALCIUM 9.2 10/14/2015 0916   PROT 7.4 09/30/2015 0810   ALBUMIN 3.3* 09/30/2015 0810   AST 16 09/30/2015 0810   ALT 10* 09/30/2015 0810   ALKPHOS 73 09/30/2015 0810   BILITOT 0.4 09/30/2015 0810   GFRNONAA >60 10/14/2015 0916   GFRAA >60 10/14/2015 0916    No results found for: SPEP, UPEP  Lab Results  Component Value Date   WBC 17.0* 10/14/2015   NEUTROABS 13.6* 10/14/2015   HGB 11.8* 10/14/2015   HCT 34.6* 10/14/2015   MCV 94.4 10/14/2015   PLT 201 10/14/2015      Chemistry      Component Value Date/Time   NA 136 10/14/2015 0916   K 3.8 10/14/2015 0916   CL 103 10/14/2015 0916   CO2 28 10/14/2015 0916   BUN 13 10/14/2015 0916   CREATININE 0.71  10/14/2015 0916      Component Value Date/Time   CALCIUM 9.2 10/14/2015 0916   ALKPHOS 73 09/30/2015 0810   AST 16 09/30/2015 0810   ALT 10* 09/30/2015 0810   BILITOT 0.4 09/30/2015 0810        ASSESSMENT & PLAN:   # 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 Patient tolerated cycle #3 with mild to moderate difficulties [C discussion below]. Patient had significant improvement on current chemotherapy.   # Will get a PET scan in about 10 days. He'll follow-up with me in 2 weeks to review the results. The next plan of treatment  would be based upon the results of the PET scan.  # Left lower extremity pain/tingling and numbness- mild footdrop noted. Discontinue Taxotere. At Neurontin 300 mg 3 times a day. Recommend PT.  # Oral thrush- recommend nystatin  # Hypokalemia potassium 3.0; improved; 4.2  # Left hip pain improved- continue MS Contin 30 twice a day at this time.  # Follow-up with me in 2 weeks with CBC CMP. Hold further chemotherapy for now.    Cammie Sickle, MD 10/14/2015 11:30 AM

## 2015-10-21 ENCOUNTER — Ambulatory Visit
Admission: RE | Admit: 2015-10-21 | Discharge: 2015-10-21 | Disposition: A | Discharge status: Home / Self Care | Payer: Medicaid Other | Source: Ambulatory Visit | Attending: Internal Medicine | Admitting: Internal Medicine

## 2015-10-21 ENCOUNTER — Ambulatory Visit: Payer: Self-pay | Admitting: Internal Medicine

## 2015-10-21 ENCOUNTER — Ambulatory Visit: Payer: Self-pay

## 2015-10-21 ENCOUNTER — Other Ambulatory Visit: Payer: Self-pay

## 2015-10-21 DIAGNOSIS — C760 Malignant neoplasm of head, face and neck: Secondary | ICD-10-CM | POA: Diagnosis present

## 2015-10-21 DIAGNOSIS — C772 Secondary and unspecified malignant neoplasm of intra-abdominal lymph nodes: Secondary | ICD-10-CM | POA: Insufficient documentation

## 2015-10-21 DIAGNOSIS — I7090 Unspecified atherosclerosis: Secondary | ICD-10-CM | POA: Diagnosis not present

## 2015-10-21 DIAGNOSIS — C099 Malignant neoplasm of tonsil, unspecified: Secondary | ICD-10-CM | POA: Insufficient documentation

## 2015-10-21 DIAGNOSIS — I2510 Atherosclerotic heart disease of native coronary artery without angina pectoris: Secondary | ICD-10-CM | POA: Diagnosis not present

## 2015-10-21 DIAGNOSIS — G629 Polyneuropathy, unspecified: Secondary | ICD-10-CM | POA: Insufficient documentation

## 2015-10-21 LAB — GLUCOSE, CAPILLARY: GLUCOSE-CAPILLARY: 91 mg/dL (ref 65–99)

## 2015-10-21 MED ORDER — FLUDEOXYGLUCOSE F - 18 (FDG) INJECTION
12.5400 | Freq: Once | INTRAVENOUS | Status: AC | PRN
  Administered 2015-10-21: 12.54 via INTRAVENOUS

## 2015-10-23 ENCOUNTER — Inpatient Hospital Stay (HOSPITAL_BASED_OUTPATIENT_CLINIC_OR_DEPARTMENT_OTHER): Payer: Medicaid Other | Admitting: Internal Medicine

## 2015-10-23 ENCOUNTER — Inpatient Hospital Stay: Payer: Medicaid Other

## 2015-10-23 VITALS — BP 153/78 | HR 87 | Temp 97.8°F | Resp 18 | Wt 143.3 lb

## 2015-10-23 DIAGNOSIS — E876 Hypokalemia: Secondary | ICD-10-CM

## 2015-10-23 DIAGNOSIS — C7951 Secondary malignant neoplasm of bone: Secondary | ICD-10-CM

## 2015-10-23 DIAGNOSIS — K1230 Oral mucositis (ulcerative), unspecified: Secondary | ICD-10-CM

## 2015-10-23 DIAGNOSIS — C099 Malignant neoplasm of tonsil, unspecified: Secondary | ICD-10-CM

## 2015-10-23 DIAGNOSIS — F17210 Nicotine dependence, cigarettes, uncomplicated: Secondary | ICD-10-CM

## 2015-10-23 DIAGNOSIS — C787 Secondary malignant neoplasm of liver and intrahepatic bile duct: Secondary | ICD-10-CM

## 2015-10-23 DIAGNOSIS — C01 Malignant neoplasm of base of tongue: Secondary | ICD-10-CM

## 2015-10-23 DIAGNOSIS — Z79899 Other long term (current) drug therapy: Secondary | ICD-10-CM

## 2015-10-23 DIAGNOSIS — M25552 Pain in left hip: Secondary | ICD-10-CM

## 2015-10-23 LAB — COMPREHENSIVE METABOLIC PANEL
ALBUMIN: 3.9 g/dL (ref 3.5–5.0)
ALT: 10 U/L — ABNORMAL LOW (ref 17–63)
ANION GAP: 5 (ref 5–15)
AST: 15 U/L (ref 15–41)
Alkaline Phosphatase: 96 U/L (ref 38–126)
BILIRUBIN TOTAL: 0.4 mg/dL (ref 0.3–1.2)
BUN: 17 mg/dL (ref 6–20)
CALCIUM: 9.5 mg/dL (ref 8.9–10.3)
CO2: 27 mmol/L (ref 22–32)
Chloride: 103 mmol/L (ref 101–111)
Creatinine, Ser: 0.66 mg/dL (ref 0.61–1.24)
GFR calc Af Amer: >60 mL/min (ref >60)
GFR calc non Af Amer: >60 mL/min (ref >60)
GLUCOSE: 92 mg/dL (ref 65–99)
Potassium: 4 mmol/L (ref 3.5–5.1)
Sodium: 135 mmol/L (ref 135–145)
TOTAL PROTEIN: 7.8 g/dL (ref 6.5–8.1)

## 2015-10-23 LAB — CBC WITH DIFFERENTIAL/PLATELET
BASOS ABS: 0.1 10*3/uL (ref 0–0.1)
BASOS PCT: 1 %
Eosinophils Absolute: 0.4 10*3/uL (ref 0–0.7)
Eosinophils Relative: 5 %
HEMATOCRIT: 34.3 % — AB (ref 40.0–52.0)
Hemoglobin: 11.8 g/dL — ABNORMAL LOW (ref 13.0–18.0)
Lymphocytes Relative: 15 %
Lymphs Abs: 1.2 10*3/uL (ref 1.0–3.6)
MCH: 32.5 pg (ref 26.0–34.0)
MCHC: 34.3 g/dL (ref 32.0–36.0)
MCV: 94.7 fL (ref 80.0–100.0)
MONO ABS: 0.7 10*3/uL (ref 0.2–1.0)
Monocytes Relative: 9 %
NEUTROS ABS: 5.5 10*3/uL (ref 1.4–6.5)
NEUTROS PCT: 70 %
Platelets: 185 10*3/uL (ref 150–440)
RBC: 3.62 MIL/uL — AB (ref 4.40–5.90)
RDW: 18.6 % — AB (ref 11.5–14.5)
WBC: 7.8 10*3/uL (ref 3.8–10.6)

## 2015-10-23 LAB — MAGNESIUM: Magnesium: 1.8 mg/dL (ref 1.7–2.4)

## 2015-10-23 NOTE — Patient Instructions (Signed)
Pembrolizumab injection What is this medicine? PEMBROLIZUMAB (pem broe liz ue mab) is a monoclonal antibody. It is used to treat melanoma and non-small cell lung cancer. This medicine may be used for other purposes; ask your health care provider or pharmacist if you have questions. What should I tell my health care provider before I take this medicine? They need to know if you have any of these conditions: -diabetes -immune system problems -inflammatory bowel disease -liver disease -lung or breathing disease -lupus -an unusual or allergic reaction to pembrolizumab, other medicines, foods, dyes, or preservatives -pregnant or trying to get pregnant -breast-feeding How should I use this medicine? This medicine is for infusion into a vein. It is given by a health care professional in a hospital or clinic setting. A special MedGuide will be given to you before each treatment. Be sure to read this information carefully each time. Talk to your pediatrician regarding the use of this medicine in children. Special care may be needed. Overdosage: If you think you have taken too much of this medicine contact a poison control center or emergency room at once. NOTE: This medicine is only for you. Do not share this medicine with others. What if I miss a dose? It is important not to miss your dose. Call your doctor or health care professional if you are unable to keep an appointment. What may interact with this medicine? Interactions have not been studied. Give your health care provider a list of all the medicines, herbs, non-prescription drugs, or dietary supplements you use. Also tell them if you smoke, drink alcohol, or use illegal drugs. Some items may interact with your medicine. This list may not describe all possible interactions. Give your health care provider a list of all the medicines, herbs, non-prescription drugs, or dietary supplements you use. Also tell them if you smoke, drink alcohol, or  use illegal drugs. Some items may interact with your medicine. What should I watch for while using this medicine? Your condition will be monitored carefully while you are receiving this medicine. You may need blood work done while you are taking this medicine. Do not become pregnant while taking this medicine or for 4 months after stopping it. Women should inform their doctor if they wish to become pregnant or think they might be pregnant. There is a potential for serious side effects to an unborn child. Talk to your health care professional or pharmacist for more information. Do not breast-feed an infant while taking this medicine or for 4 months after the last dose. What side effects may I notice from receiving this medicine? Side effects that you should report to your doctor or health care professional as soon as possible: -allergic reactions like skin rash, itching or hives, swelling of the face, lips, or tongue -bloody or black, tarry stools -breathing problems -change in the amount of urine -changes in vision -chest pain -chills -dark urine -dizziness or feeling faint or lightheaded -fast or irregular heartbeat -fever -flushing -hair loss -muscle pain -muscle weakness -persistent headache -signs and symptoms of high blood sugar such as dizziness; dry mouth; dry skin; fruity breath; nausea; stomach pain; increased hunger or thirst; increased urination -signs and symptoms of liver injury like dark urine, light-colored stools, loss of appetite, nausea, right upper belly pain, yellowing of the eyes or skin -stomach pain -weight loss Side effects that usually do not require medical attention (Report these to your doctor or health care professional if they continue or are bothersome.):constipation -cough -diarrhea -joint pain -  tiredness This list may not describe all possible side effects. Call your doctor for medical advice about side effects. You may report side effects to FDA at  1-800-FDA-1088. Where should I keep my medicine? This drug is given in a hospital or clinic and will not be stored at home. NOTE: This sheet is a summary. It may not cover all possible information. If you have questions about this medicine, talk to your doctor, pharmacist, or health care provider.    2016, Elsevier/Gold Standard. (2014-07-22 17:24:19)  

## 2015-10-23 NOTE — Progress Notes (Signed)
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  Patient complains of significant pain/left lower extremity tingling and numbness over the last few days. Also noted to have some weakness in the left lower leg. He is having difficulty walking. He is stumbling. Continues to have pain in the left lower extremity not improved with Neurontin 300 mg 3 times a day and morphine long-acting.  Otherwise appetite improving. No lumps or bumps. No sores in the mouth. Gaining weight. No nausea no vomiting diarrhea. Denies any new lumps or bumps.  Patient is currently in the assisted living.   REVIEW OF SYSTEMS:  A complete 10 point review of system is done which is negative except mentioned above/history of present illness.   PAST MEDICAL HISTORY :  Past Medical History  Diagnosis Date  . Cancer ([place])   . Hypertension     PAST SURGICAL HISTORY :   Past Surgical History  Procedure Laterality Date  . Peripheral vascular catheterization N/A 08/18/2015    Procedure: Glori Luis Cath Insertion;  Surgeon: Katha Cabal, MD;  Location: [facility];  Service: Cardiovascular;  Laterality: N/A;    FAMILY HISTORY :  No family history on file.  SOCIAL HISTORY:   Social History  Substance Use Topics  .  Smoking status: Current Every Day Smoker -- 1.00 packs/day    Types: Cigarettes  . Smokeless tobacco: Not on file  . Alcohol Use: No    ALLERGIES:  has No Known Allergies.  MEDICATIONS:  Current Outpatient Prescriptions  Medication Sig Dispense Refill  . amLODipine (NORVASC) 5 MG tablet Take 1 tablet (5 mg total) by mouth daily. 60 tablet 1  . diphenoxylate-atropine (LOMOTIL) 2.5-0.025 MG tablet Take 1 tablet by mouth 4 (four) times daily as needed for diarrhea or loose stools. Take it along with immodium 30 tablet 0  . gabapentin (NEURONTIN) 300 MG capsule Take 2 capsules (600 mg total) by mouth 3 (three) times daily. 180 capsule 3  . lidocaine-prilocaine (EMLA) cream Apply 1 application topically as needed. Apply to port a cath site 1 hours before treatments 30 g 3  . loperamide (IMODIUM) 2 MG capsule Take 1 capsule (2 mg total) by mouth as needed for diarrhea or loose stools. 30 capsule 0  . loratadine (CLARITIN) 10 MG tablet Once a day x10 days; START on the day of chemo. 30 tablet 0  . magic mouthwash w/lidocaine SOLN Take 5 mLs by mouth 4 (four) times daily as needed for mouth pain. 250 mL 3  . morphine (MS CONTIN) 30 MG 12 hr tablet Take 1 tablet (30 mg total) by mouth every 12 (twelve) hours. 60 tablet 0  . nystatin (MYCOSTATIN) 100000 UNIT/ML suspension Take 5 mLs (500,000 Units total) by mouth 4 (four) times daily. 60 mL 0  . ondansetron (ZOFRAN)  8 MG tablet Take 1 tablet (8 mg total) by mouth every 8 (eight) hours as needed for nausea or vomiting. 30 tablet 0  . pantoprazole (PROTONIX) 40 MG tablet Take 1 tablet (40 mg total) by mouth daily. 60 tablet 3  . potassium chloride SA (K-DUR,KLOR-CON) 20 MEQ tablet Take 2 tablets (40 mEq total) by mouth 2 (two) times daily. 30 tablet 3  . prochlorperazine (COMPAZINE) 10 MG tablet Take 1 tablet (10 mg total) by mouth every 6 (six) hours as needed for nausea or vomiting. 60 tablet 0  . ranitidine (ZANTAC) 75 MG tablet Take 75 mg by mouth  daily.     No current facility-administered medications for this visit.    PHYSICAL EXAMINATION: ECOG PERFORMANCE STATUS: 0 - Asymptomatic  There were no vitals taken for this visit.  There were no vitals filed for this visit.  GENERAL: Well-nourished well-developed; Alert, no distress and comfortable.   Alone.  EYES: no pallor or icterus OROPHARYNX:Thrush noted in the posterior pharyngeal wall. right tonsillar oral ulceration improving.  NECK: supple, no masses felt LYMPH: No lymphadenopathy noted. LUNGS: clear to auscultation and  No wheeze or crackles HEART/CVS: regular rate & rhythm and no murmurs; No lower extremity edema ABDOMEN:abdomen soft, non-tender and normal bowel sounds Musculoskeletal:no cyanosis of digits and no clubbing  PSYCH: alert & oriented x 3 with fluent speech NEURO: no focal motor/sensory deficits; mild foot drop noted in Left LE.  SKIN:  no rashes or significant lesions  LABORATORY DATA:  I have reviewed the data as listed    Component Value Date/Time   NA 136 10/14/2015 0916   K 3.8 10/14/2015 0916   CL 103 10/14/2015 0916   CO2 28 10/14/2015 0916   GLUCOSE 88 10/14/2015 0916   BUN 13 10/14/2015 0916   CREATININE 0.71 10/14/2015 0916   CALCIUM 9.2 10/14/2015 0916   PROT 7.4 09/30/2015 0810   ALBUMIN 3.3* 09/30/2015 0810   AST 16 09/30/2015 0810   ALT 10* 09/30/2015 0810   ALKPHOS 73 09/30/2015 0810   BILITOT 0.4 09/30/2015 0810   GFRNONAA >60 10/14/2015 0916   GFRAA >60 10/14/2015 0916    No results found for: SPEP, UPEP  Lab Results  Component Value Date   WBC 17.0* 10/14/2015   NEUTROABS 13.6* 10/14/2015   HGB 11.8* 10/14/2015   HCT 34.6* 10/14/2015   MCV 94.4 10/14/2015   PLT 201 10/14/2015      Chemistry      Component Value Date/Time   NA 136 10/14/2015 0916   K 3.8 10/14/2015 0916   CL 103 10/14/2015 0916   CO2 28 10/14/2015 0916   BUN 13 10/14/2015 0916   CREATININE 0.71 10/14/2015 0916      Component Value  Date/Time   CALCIUM 9.2 10/14/2015 0916   ALKPHOS 73 09/30/2015 0810   AST 16 09/30/2015 0810   ALT 10* 09/30/2015 0810   BILITOT 0.4 09/30/2015 0810        ASSESSMENT & PLAN:   # 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 Patient tolerated cycle #3 with mild to moderate difficulties [C discussion below]. Patient had significant improvement on current chemotherapy. PET scan shows-significant improvement of the base of tongue lesion; liver lesions/lesions resolved; improved but still present left cervical lymph node about 2 cm in size. I reviewed the PET scan myself and with the patient in detail.  # Discussed with the patient given  the metastatic disease/incurable- in general would recommend indefinite therapy. However, given his significant neuropathy footdrop- I would recommend holding further chemotherapy at this time.  # I would recommend starting Keytruda every 3 weeks. This could potentially be started in 3 weeks from now/next visit. The goal of therapy is palliative; and length of treatments are likely ongoing/based upon the results of the scans. Discussed the potential side effects of immunotherapy including but not limited to diarrhea; skin rash; elevated LFTs/endocrine abnormalities etc.  # Left lower extremity pain/tingling and numbness- mild footdrop noted-improving. Continue  Neurontin 300 mg 3 times a day.   # Left hip pain improved- continue MS Contin 30 twice a day at this time.  # Follow-up with me in 3 weeks with CBC CMP/TSH/Keytruda infusion.     Cammie Sickle, MD 10/23/2015 9:13 AM

## 2015-10-23 NOTE — Progress Notes (Signed)
Patient states he is sleeping better.  Still bothered by neuropathy in his legs and feet.

## 2015-11-09 ENCOUNTER — Inpatient Hospital Stay: Payer: Medicaid Other

## 2015-11-09 ENCOUNTER — Emergency Department: Payer: Medicaid Other

## 2015-11-09 ENCOUNTER — Encounter: Payer: Self-pay | Admitting: Emergency Medicine

## 2015-11-09 ENCOUNTER — Inpatient Hospital Stay
Admission: EM | Admit: 2015-11-09 | Discharge: 2015-11-11 | DRG: 065 | Disposition: A | Discharge status: Skilled Nursing Facility | Payer: Medicaid Other | Attending: Internal Medicine | Admitting: Internal Medicine

## 2015-11-09 DIAGNOSIS — Z7982 Long term (current) use of aspirin: Secondary | ICD-10-CM

## 2015-11-09 DIAGNOSIS — I639 Cerebral infarction, unspecified: Secondary | ICD-10-CM | POA: Diagnosis present

## 2015-11-09 DIAGNOSIS — Z79899 Other long term (current) drug therapy: Secondary | ICD-10-CM

## 2015-11-09 DIAGNOSIS — C7951 Secondary malignant neoplasm of bone: Secondary | ICD-10-CM | POA: Diagnosis present

## 2015-11-09 DIAGNOSIS — Z7902 Long term (current) use of antithrombotics/antiplatelets: Secondary | ICD-10-CM

## 2015-11-09 DIAGNOSIS — I63511 Cerebral infarction due to unspecified occlusion or stenosis of right middle cerebral artery: Principal | ICD-10-CM | POA: Diagnosis present

## 2015-11-09 DIAGNOSIS — C801 Malignant (primary) neoplasm, unspecified: Secondary | ICD-10-CM

## 2015-11-09 DIAGNOSIS — G8194 Hemiplegia, unspecified affecting left nondominant side: Secondary | ICD-10-CM | POA: Diagnosis present

## 2015-11-09 DIAGNOSIS — C787 Secondary malignant neoplasm of liver and intrahepatic bile duct: Secondary | ICD-10-CM | POA: Diagnosis present

## 2015-11-09 DIAGNOSIS — C01 Malignant neoplasm of base of tongue: Secondary | ICD-10-CM | POA: Diagnosis present

## 2015-11-09 DIAGNOSIS — M21372 Foot drop, left foot: Secondary | ICD-10-CM | POA: Diagnosis present

## 2015-11-09 DIAGNOSIS — I10 Essential (primary) hypertension: Secondary | ICD-10-CM | POA: Diagnosis present

## 2015-11-09 DIAGNOSIS — Z809 Family history of malignant neoplasm, unspecified: Secondary | ICD-10-CM | POA: Diagnosis not present

## 2015-11-09 DIAGNOSIS — G8929 Other chronic pain: Secondary | ICD-10-CM | POA: Diagnosis present

## 2015-11-09 DIAGNOSIS — F17210 Nicotine dependence, cigarettes, uncomplicated: Secondary | ICD-10-CM | POA: Diagnosis present

## 2015-11-09 DIAGNOSIS — R531 Weakness: Secondary | ICD-10-CM

## 2015-11-09 DIAGNOSIS — G629 Polyneuropathy, unspecified: Secondary | ICD-10-CM | POA: Diagnosis present

## 2015-11-09 HISTORY — DX: Tobacco use: Z72.0

## 2015-11-09 HISTORY — DX: Malignant neoplasm of base of tongue: C01

## 2015-11-09 LAB — CBC WITH DIFFERENTIAL/PLATELET
BASOS ABS: 0 10*3/uL (ref 0–0.1)
Basophils Relative: 0 %
Eosinophils Absolute: 0.1 10*3/uL (ref 0–0.7)
HEMATOCRIT: 41.8 % (ref 40.0–52.0)
HEMOGLOBIN: 14 g/dL (ref 13.0–18.0)
LYMPHS ABS: 1 10*3/uL (ref 1.0–3.6)
MCH: 31.6 pg (ref 26.0–34.0)
MCHC: 33.4 g/dL (ref 32.0–36.0)
MCV: 94.5 fL (ref 80.0–100.0)
Monocytes Absolute: 0.7 10*3/uL (ref 0.2–1.0)
Monocytes Relative: 9 %
NEUTROS ABS: 5.3 10*3/uL (ref 1.4–6.5)
Neutrophils Relative %: 75 %
Platelets: 256 10*3/uL (ref 150–440)
RBC: 4.42 MIL/uL (ref 4.40–5.90)
RDW: 17 % — ABNORMAL HIGH (ref 11.5–14.5)
WBC: 7.1 10*3/uL (ref 3.8–10.6)

## 2015-11-09 LAB — URINE DRUG SCREEN, QUALITATIVE (ARMC ONLY)
Amphetamines, Ur Screen: NOT DETECTED
BARBITURATES, UR SCREEN: NOT DETECTED
Benzodiazepine, Ur Scrn: NOT DETECTED
CANNABINOID 50 NG, UR ~~LOC~~: NOT DETECTED
Cocaine Metabolite,Ur ~~LOC~~: NOT DETECTED
MDMA (ECSTASY) UR SCREEN: NOT DETECTED
Methadone Scn, Ur: NOT DETECTED
Opiate, Ur Screen: POSITIVE — AB
PHENCYCLIDINE (PCP) UR S: NOT DETECTED
TRICYCLIC, UR SCREEN: NOT DETECTED

## 2015-11-09 LAB — COMPREHENSIVE METABOLIC PANEL
ALBUMIN: 4.2 g/dL (ref 3.5–5.0)
ALT: 14 U/L — ABNORMAL LOW (ref 17–63)
ANION GAP: 9 (ref 5–15)
AST: 25 U/L (ref 15–41)
Alkaline Phosphatase: 91 U/L (ref 38–126)
BILIRUBIN TOTAL: 0.6 mg/dL (ref 0.3–1.2)
BUN: 16 mg/dL (ref 6–20)
CHLORIDE: 101 mmol/L (ref 101–111)
CO2: 25 mmol/L (ref 22–32)
Calcium: 9.7 mg/dL (ref 8.9–10.3)
Creatinine, Ser: 0.78 mg/dL (ref 0.61–1.24)
GFR calc Af Amer: >60 mL/min (ref >60)
GFR calc non Af Amer: >60 mL/min (ref >60)
Glucose, Bld: 94 mg/dL (ref 65–99)
POTASSIUM: 4.1 mmol/L (ref 3.5–5.1)
SODIUM: 135 mmol/L (ref 135–145)
Total Protein: 8.3 g/dL — ABNORMAL HIGH (ref 6.5–8.1)

## 2015-11-09 LAB — LIPID PANEL
Cholesterol: 164 mg/dL (ref 0–200)
HDL: 30 mg/dL — AB (ref >40)
LDL Cholesterol: 114 mg/dL — ABNORMAL HIGH (ref 0–99)
TRIGLYCERIDES: 100 mg/dL (ref <150)
Total CHOL/HDL Ratio: 5.5 RATIO
VLDL: 20 mg/dL (ref 0–40)

## 2015-11-09 LAB — PROTIME-INR
INR: 1.13
Prothrombin Time: 14.7 seconds (ref 11.4–15.0)

## 2015-11-09 LAB — TROPONIN I: Troponin I: <0.03 ng/mL (ref <0.031)

## 2015-11-09 MED ORDER — GABAPENTIN 300 MG PO CAPS
600.0000 mg | ORAL_CAPSULE | Freq: Three times a day (TID) | ORAL | Status: DC
  Administered 2015-11-09 – 2015-11-11 (×5): 600 mg via ORAL
  Filled 2015-11-09 (×5): qty 2

## 2015-11-09 MED ORDER — SODIUM CHLORIDE 0.9% FLUSH
3.0000 mL | Freq: Two times a day (BID) | INTRAVENOUS | Status: DC
  Administered 2015-11-10: 3 mL via INTRAVENOUS

## 2015-11-09 MED ORDER — NICOTINE 21 MG/24HR TD PT24
21.0000 mg | MEDICATED_PATCH | Freq: Every day | TRANSDERMAL | Status: DC
  Filled 2015-11-09 (×2): qty 1

## 2015-11-09 MED ORDER — MAGIC MOUTHWASH W/LIDOCAINE
5.0000 mL | Freq: Four times a day (QID) | ORAL | Status: DC | PRN
  Filled 2015-11-09: qty 5

## 2015-11-09 MED ORDER — SODIUM CHLORIDE 0.9 % IV SOLN
INTRAVENOUS | Status: DC
  Administered 2015-11-09 – 2015-11-10 (×2): via INTRAVENOUS

## 2015-11-09 MED ORDER — ACETAMINOPHEN 650 MG RE SUPP: 650.0000 mg | Freq: Four times a day (QID) | RECTAL | Status: DC | PRN

## 2015-11-09 MED ORDER — LOPERAMIDE HCL 2 MG PO CAPS
2.0000 mg | ORAL_CAPSULE | ORAL | Status: DC | PRN
Start: 2015-11-09 — End: 2015-11-11

## 2015-11-09 MED ORDER — ONDANSETRON HCL 4 MG/2ML IJ SOLN: 4.0000 mg | Freq: Four times a day (QID) | INTRAMUSCULAR | Status: DC | PRN

## 2015-11-09 MED ORDER — PANTOPRAZOLE SODIUM 40 MG PO TBEC
40.0000 mg | DELAYED_RELEASE_TABLET | Freq: Every day | ORAL | Status: DC
  Administered 2015-11-10 – 2015-11-11 (×2): 40 mg via ORAL
  Filled 2015-11-09 (×2): qty 1

## 2015-11-09 MED ORDER — FAMOTIDINE 20 MG PO TABS
20.0000 mg | ORAL_TABLET | Freq: Every day | ORAL | Status: DC
  Administered 2015-11-10 – 2015-11-11 (×2): 20 mg via ORAL
  Filled 2015-11-09 (×2): qty 1

## 2015-11-09 MED ORDER — GADOBENATE DIMEGLUMINE 529 MG/ML IV SOLN
15.0000 mL | Freq: Once | INTRAVENOUS | Status: AC | PRN
  Administered 2015-11-09: 14 mL via INTRAVENOUS

## 2015-11-09 MED ORDER — ACETAMINOPHEN 325 MG PO TABS
650.0000 mg | ORAL_TABLET | Freq: Four times a day (QID) | ORAL | Status: DC | PRN
Start: 2015-11-09 — End: 2015-11-11

## 2015-11-09 MED ORDER — AMLODIPINE BESYLATE 5 MG PO TABS
5.0000 mg | ORAL_TABLET | Freq: Every day | ORAL | Status: DC
  Administered 2015-11-10 – 2015-11-11 (×2): 5 mg via ORAL
  Filled 2015-11-09 (×2): qty 1

## 2015-11-09 MED ORDER — ONDANSETRON HCL 4 MG PO TABS: 4.0000 mg | ORAL_TABLET | Freq: Four times a day (QID) | ORAL | Status: DC | PRN

## 2015-11-09 MED ORDER — ENOXAPARIN SODIUM 40 MG/0.4ML ~~LOC~~ SOLN
40.0000 mg | SUBCUTANEOUS | Status: DC
  Administered 2015-11-09 – 2015-11-10 (×2): 40 mg via SUBCUTANEOUS
  Filled 2015-11-09 (×2): qty 0.4

## 2015-11-09 MED ORDER — ATORVASTATIN CALCIUM 20 MG PO TABS
40.0000 mg | ORAL_TABLET | Freq: Every day | ORAL | Status: DC
  Administered 2015-11-10: 40 mg via ORAL
  Filled 2015-11-09: qty 2

## 2015-11-09 MED ORDER — MORPHINE SULFATE ER 30 MG PO TBCR
30.0000 mg | EXTENDED_RELEASE_TABLET | Freq: Two times a day (BID) | ORAL | Status: DC
  Administered 2015-11-09 – 2015-11-11 (×4): 30 mg via ORAL
  Filled 2015-11-09 (×4): qty 1

## 2015-11-09 MED ORDER — ASPIRIN 81 MG PO CHEW
81.0000 mg | CHEWABLE_TABLET | Freq: Every day | ORAL | Status: DC
  Administered 2015-11-10 – 2015-11-11 (×2): 81 mg via ORAL
  Filled 2015-11-09 (×2): qty 1

## 2015-11-09 NOTE — ED Notes (Signed)
Pt returns from ct at this time. nad noted.

## 2015-11-09 NOTE — Progress Notes (Signed)
Place order for low Na diet per [facility], patient passed swallow screen in ED and has been fed already.

## 2015-11-09 NOTE — ED Notes (Signed)
Pt returns from MRI at this time. NAD.

## 2015-11-09 NOTE — ED Provider Notes (Signed)
[facility] Emergency Department Provider Note  ____________________________________________    I have reviewed the triage vital signs and the nursing notes.   HISTORY  Chief Complaint Cerebrovascular Accident    HPI Ryan Rush is a 55 y.o. male who presents with left arm weakness which started 2 days ago. Patient reports he has throat cancer and because that he is in [facility]. He reported weakness in his left arm started 2 days ago but he didn't tell anyone. He reports he sees Dr. Tish Men at the Cancer center. He denies other areas of weakness or numbness. He denies headache. No head injury     Past Medical History  Diagnosis Date  . Cancer ([place])   . Hypertension     Patient Active Problem List   Diagnosis Date Noted  . Cancer ([place]) 08/17/2015  . 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 Peripheral vascular catheterization N/A 08/18/2015    Procedure: Glori Luis Cath Insertion;  Surgeon: Katha Cabal, MD;  Location: [facility];  Service: Cardiovascular;  Laterality: N/A;    Current Outpatient Rx  Name  Route  Sig  Dispense  Refill  . amLODipine (NORVASC) 5 MG tablet   Oral   Take 1 tablet (5 mg total) by mouth daily.   60 tablet   1   . diphenoxylate-atropine (LOMOTIL) 2.5-0.025 MG tablet   Oral   Take 1 tablet by mouth 4 (four) times daily as needed for diarrhea or loose stools. Take it along with immodium   30 tablet   0   . gabapentin (NEURONTIN) 300 MG capsule   Oral   Take 2 capsules (600 mg total) by mouth 3 (three) times daily.   180 capsule   3   . lidocaine-prilocaine (EMLA) cream   Topical   Apply 1 application topically as needed. Apply to port a cath site 1 hours before treatments   30 g   3   . loperamide (IMODIUM) 2 MG capsule   Oral   Take 1 capsule (2 mg total) by mouth as needed for diarrhea or loose stools.   30  capsule   0   . loratadine (CLARITIN) 10 MG tablet      Once a day x10 days; START on the day of chemo.   30 tablet   0   . magic mouthwash w/lidocaine SOLN   Oral   Take 5 mLs by mouth 4 (four) times daily as needed for mouth pain.   250 mL   3   . morphine (MS CONTIN) 30 MG 12 hr tablet   Oral   Take 1 tablet (30 mg total) by mouth every 12 (twelve) hours.   60 tablet   0   . nystatin (MYCOSTATIN) 100000 UNIT/ML suspension   Oral   Take 5 mLs (500,000 Units total) by mouth 4 (four) times daily.   60 mL   0   . ondansetron (ZOFRAN) 8 MG tablet   Oral   Take 1 tablet (8 mg total) by mouth every 8 (eight) hours as needed for nausea or vomiting.   30 tablet   0   . pantoprazole (PROTONIX) 40 MG tablet   Oral   Take 1 tablet (40 mg total) by mouth daily.   60 tablet   3   . potassium chloride SA (K-DUR,KLOR-CON) 20 MEQ tablet   Oral   Take 2 tablets (40  mEq total) by mouth 2 (two) times daily.   30 tablet   3   . prochlorperazine (COMPAZINE) 10 MG tablet   Oral   Take 1 tablet (10 mg total) by mouth every 6 (six) hours as needed for nausea or vomiting.   60 tablet   0   . ranitidine (ZANTAC) 75 MG tablet   Oral   Take 75 mg by mouth daily.           Allergies Review of patient's allergies indicates no known allergies.  History reviewed. No pertinent family history.  Social History Social History  Substance Use Topics  . Smoking status: Current Every Day Smoker -- 1.00 packs/day    Types: Cigarettes  . Smokeless tobacco: None  . Alcohol Use: No    Review of Systems  Constitutional: Negative for fever. Eyes: Negative for redness ENT: Negative for sore throat Cardiovascular: Negative for chest pain Respiratory: Negative for shortness of breath. Gastrointestinal: Negative for abdominal pain Genitourinary: Negative for dysuria. Musculoskeletal: Negative for back pain. Skin: Negative for rash. Neurological: As above Psychiatric: no  anxiety    ____________________________________________   PHYSICAL EXAM:  VITAL SIGNS: ED Triage Vitals  Enc Vitals Group     BP 11/09/15 0958 158/91 mmHg     Pulse Rate 11/09/15 0958 94     Resp 11/09/15 0958 23     Temp 11/09/15 0958 98.1 F (36.7 C)     Temp Source 11/09/15 0958 Oral     SpO2 11/09/15 0958 96 %     Weight 11/09/15 0958 150 lb (68.04 kg)     Height 11/09/15 0958 5\' 9"  (1.753 m)     Head Cir --      Peak Flow --      Pain Score --      Pain Loc --      Pain Edu? --      Excl. in [place]? --      Constitutional: Alert and oriented. Well appearing and in no distress.  Eyes: Conjunctivae are normal. No erythema or injection ENT   Head: Normocephalic and atraumatic.   Mouth/Throat: Mucous membranes are moist. Cardiovascular: Normal rate, regular rhythm. Normal and symmetric distal pulses are present in the upper extremities. No murmurs or rubs  Respiratory: Normal respiratory effort without tachypnea nor retractions. Breath sounds are clear and equal bilaterally.  Gastrointestinal: Soft and non-tender in all quadrants. No distention. There is no CVA tenderness. Genitourinary: deferred Musculoskeletal: Nontender with normal range of motion in all extremities. No lower extremity tenderness nor edema. Neurologic:  Normal speech and language. Patient with significant weakness of the left arm, most prominent from the elbow distal, he is able to shrug his shoulders bilaterally. Cranial nerves II-12 are intact Skin:  Skin is warm, dry and intact. No rash noted. Psychiatric: Mood and affect are normal. Patient exhibits appropriate insight and judgment.  ____________________________________________    LABS (pertinent positives/negatives)  Labs Reviewed  COMPREHENSIVE METABOLIC PANEL - Abnormal; Notable for the following:    Total Protein 8.3 (*)    ALT 14 (*)    All other components within normal limits  CBC WITH DIFFERENTIAL/PLATELET - Abnormal; Notable  for the following:    RDW 17.0 (*)    All other components within normal limits  TROPONIN I  PROTIME-INR  URINE RAPID DRUG SCREEN, HOSP PERFORMED  URINALYSIS COMPLETEWITH MICROSCOPIC ([facility] ONLY)    ____________________________________________   EKG  None  ____________________________________________    RADIOLOGY  CT head  unremarkable  ____________________________________________   PROCEDURES  Procedure(s) performed: none  Critical Care performed: none  ____________________________________________   INITIAL IMPRESSION / ASSESSMENT AND PLAN / ED COURSE  Pertinent labs & imaging results that were available during my care of the patient were reviewed by me and considered in my medical decision making (see chart for details).  Patient's presentation is concerning for possible metastases to the brain versus stroke. We will send for CT head, check labs and reevaluate. ____________________________________________  CT head is unremarkable. Discussed case with Dr. Rogue Bussing his oncologist who recommends admission and MRI for further evaluation.  FINAL CLINICAL IMPRESSION(S) / ED DIAGNOSES  Final diagnoses:  Cerebral infarction due to unspecified mechanism          Lavonia Drafts, MD 11/09/15 1322

## 2015-11-09 NOTE — ED Notes (Signed)
Pt over in MRI transported by [employer].

## 2015-11-09 NOTE — H&P (Signed)
[facility] NAME: Ryan Rush    MR#:  [id number]  DATE OF BIRTH:  [birth year]  DATE OF ADMISSION:  11/09/2015  PRIMARY CARE PHYSICIAN: Kirk Ruths., MD   REQUESTING/REFERRING PHYSICIAN: Dr. Lavonia Drafts  CHIEF COMPLAINT:   Chief Complaint  Patient presents with  . Cerebrovascular Accident    HISTORY OF PRESENT ILLNESS:  Ryan Rush  is a 55 y.o. 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 Patient was diagnosed with his cancer 3 months ago. He has received 3 cycles of traditional palliative chemotherapy with good response. However he was experiencing left lower extremity neurological side effects with tingling and numbness and left foot drop. Plans were to start him on immunotherapy starting this week. Patient noticed that his whole left side was numb 2 days ago. The numbness and weakness of his left leg has improved in the last day. He just waited to see if his left arm weakness would also improve. Since the left arm weakness was not improving he presented to the emergency room. He is right-handed. He can feel the sensation of his left arm but needs help with moderate activity. Strength is 3/5 today. CT of the head did not show any acute abnormalities.  PAST MEDICAL HISTORY:   Past Medical History  Diagnosis Date  . 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 Hypertension   . Tobacco use     PAST SURGICAL HISTORY:   Past Surgical History  Procedure Laterality Date  . Peripheral vascular catheterization N/A 08/18/2015    Procedure: Glori Luis Cath Insertion;  Surgeon: Katha Cabal, MD;  Location: [facility];  Service: Cardiovascular;  Laterality: N/A;    SOCIAL  HISTORY:   Social History  Substance Use Topics  . Smoking status: Current Every Day Smoker -- 1.00 packs/day    Types: Cigarettes  . Smokeless tobacco: Not on file  . Alcohol Use: No     Comment: occasional    FAMILY HISTORY:   Family History  Problem Relation Age of Onset  . Cancer Mother     metastasized    DRUG ALLERGIES:  No Known Allergies  REVIEW OF SYSTEMS:   Review of Systems  Constitutional: Negative for fever, chills, weight loss and malaise/fatigue.  HENT: Negative for ear discharge, ear pain, hearing loss and nosebleeds.   Eyes: Negative for blurred vision, double vision and photophobia.  Respiratory: Negative for cough, hemoptysis, shortness of breath and wheezing.   Cardiovascular: Negative for chest pain, palpitations, orthopnea and leg swelling.  Gastrointestinal: Negative for heartburn, nausea, vomiting, abdominal pain, diarrhea, constipation and melena.  Genitourinary: Negative for dysuria, urgency and frequency.  Musculoskeletal: Negative for myalgias, back pain and neck pain.  Skin: Negative for rash.  Neurological: Positive for sensory change and focal weakness. Negative for dizziness, tingling, tremors, speech change and headaches.  Endo/Heme/Allergies: Does not bruise/bleed easily.  Psychiatric/Behavioral: Negative for depression.    MEDICATIONS AT HOME:   Prior to Admission medications   Medication Sig Start Date End Date Taking? Authorizing Provider  amLODipine (NORVASC) 5 MG tablet Take 1 tablet (5 mg total) by mouth daily. 08/24/15  Yes Cammie Sickle, MD  diphenoxylate-atropine (  LOMOTIL) 2.5-0.025 MG tablet Take 1 tablet by mouth 4 (four) times daily as needed for diarrhea or loose stools. Take it along with immodium 09/09/15  Yes Cammie Sickle, MD  gabapentin (NEURONTIN) 300 MG capsule Take 2 capsules (600 mg total) by mouth 3 (three) times daily. 10/14/15  Yes Cammie Sickle, MD  lidocaine-prilocaine (EMLA) cream Apply 1  application topically as needed. Apply to port a cath site 1 hours before treatments 08/28/15  Yes Cammie Sickle, MD  loperamide (IMODIUM) 2 MG capsule Take 1 capsule (2 mg total) by mouth as needed for diarrhea or loose stools. 09/09/15  Yes Cammie Sickle, MD  loratadine (CLARITIN) 10 MG tablet Once a day x10 days; START on the day of chemo. 09/09/15  Yes Cammie Sickle, MD  magic mouthwash w/lidocaine SOLN Take 5 mLs by mouth 4 (four) times daily as needed for mouth pain. 09/24/15  Yes Cammie Sickle, MD  morphine (MS CONTIN) 30 MG 12 hr tablet Take 1 tablet (30 mg total) by mouth every 12 (twelve) hours. 08/24/15  Yes Cammie Sickle, MD  nystatin (MYCOSTATIN) 100000 UNIT/ML suspension Take 5 mLs (500,000 Units total) by mouth 4 (four) times daily. 10/14/15  Yes Cammie Sickle, MD  ondansetron (ZOFRAN) 8 MG tablet Take 1 tablet (8 mg total) by mouth every 8 (eight) hours as needed for nausea or vomiting. 09/09/15  Yes Cammie Sickle, MD  pantoprazole (PROTONIX) 40 MG tablet Take 1 tablet (40 mg total) by mouth daily. 08/24/15  Yes Cammie Sickle, MD  potassium chloride SA (K-DUR,KLOR-CON) 20 MEQ tablet Take 2 tablets (40 mEq total) by mouth 2 (two) times daily. 09/24/15  Yes Cammie Sickle, MD  prochlorperazine (COMPAZINE) 10 MG tablet Take 1 tablet (10 mg total) by mouth every 6 (six) hours as needed for nausea or vomiting. 09/09/15  Yes Cammie Sickle, MD  ranitidine (ZANTAC) 75 MG tablet Take 75 mg by mouth daily.   Yes Historical Provider, MD      VITAL SIGNS:  Blood pressure 158/90, pulse 86, temperature 98 F (36.7 C), temperature source Oral, resp. rate 25, height 5\' 9"  (1.753 m), weight 68.04 kg (150 lb), SpO2 94 %.  PHYSICAL EXAMINATION:   Physical Exam  GENERAL:  55 y.o.-year-old patient lying in the bed with no acute distress.  EYES: Pupils equal, round, reactive to light and accommodation. No scleral icterus. Extraocular muscles  intact.  HEENT: Head atraumatic, normocephalic. Oropharynx and nasopharynx clear. Mild erythema of posterior pharyngeal wall. No facial droop.  NECK:  Supple, no jugular venous distention. No thyroid enlargement, no tenderness.  LUNGS: Normal breath sounds bilaterally, no wheezing, rales,rhonchi or crepitation. No use of accessory muscles of respiration.  CARDIOVASCULAR: S1, S2 normal. No murmurs, rubs, or gallops.  ABDOMEN: Soft, nontender, nondistended. Bowel sounds present. No organomegaly or mass.  EXTREMITIES: No pedal edema, cyanosis, or clubbing.  NEUROLOGIC: Cranial nerves II through XII are intact. Muscle strength 5/5 in all extremities except 3/5 strength of left arm. Sensation intact. Gait not checked.  PSYCHIATRIC: The patient is alert and oriented x 3.  SKIN: No obvious rash, lesion, or ulcer.   LABORATORY PANEL:   CBC  Recent Labs Lab 11/09/15 0955  WBC 7.1  HGB 14.0  HCT 41.8  PLT 256   ------------------------------------------------------------------------------------------------------------------  Chemistries   Recent Labs Lab 11/09/15 0955  NA 135  K 4.1  CL 101  CO2 25  GLUCOSE 94  BUN 16  CREATININE  0.78  CALCIUM 9.7  AST 25  ALT 14*  ALKPHOS 91  BILITOT 0.6   ------------------------------------------------------------------------------------------------------------------  Cardiac Enzymes  Recent Labs Lab 11/09/15 0955  TROPONINI <0.03   ------------------------------------------------------------------------------------------------------------------  RADIOLOGY:  Ct Head Wo Contrast  11/09/2015  CLINICAL DATA:  Left-sided numbness, weakness since Saturday. EXAM: CT HEAD WITHOUT CONTRAST TECHNIQUE: Contiguous axial images were obtained from the base of the skull through the vertex without intravenous contrast. COMPARISON:  None. FINDINGS: No acute intracranial abnormality. Specifically, no hemorrhage, hydrocephalus, mass lesion, acute  infarction, or significant intracranial injury. No acute calvarial abnormality. Visualized paranasal sinuses and mastoids clear. Orbital soft tissues unremarkable. IMPRESSION: No acute intracranial abnormality. Electronically Signed   By: Rolm Baptise M.D.   On: 11/09/2015 12:20    EKG:   Orders placed or performed during the hospital encounter of 11/09/15  . ED EKG  . ED EKG  . EKG 12-Lead  . EKG 12-Lead    IMPRESSION AND PLAN:   Ryan Rush  is a 55 y.o. 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- admit to tele, neuro checks - started asa and statin, check lipid panel - MRI with and without contrast due to his cancer history to r/o mets, also carotid dopplers and ECHO -Neurology consulted - Physical therapy, occupational therapy and speech therapy consults requested  #2 hypertension- continue norvasc  #3 Neuropathy- worsened with chemo- on neurontin, in both lower extremities- worse on the left side  #4 Bone mets with chronic pain- continue MS contin  #5 Metastatic squamous cell carcinoma of tongue base- s/p 3 cycles of chemo with improvement on PET scan, patient having neuro side effects- so plans to start [place] starting this week Oncology consulted  #6 DVT Prophylaxis- lovenox    All the records are reviewed and case discussed with ED provider. Management plans discussed with the patient, family and they are in agreement.  CODE STATUS: Full Code  TOTAL TIME TAKING CARE OF THIS PATIENT: 50 minutes.    Ryan Rush M.D on 11/09/2015 at 1:48 PM  Between 7am to 6pm - Pager - [phone]  After 6pm go to [email or web address] - password [email or web address] [facility]  Office  [phone]  CC: Primary care physician;  Kirk Ruths., MD

## 2015-11-09 NOTE — ED Notes (Signed)
Pt spoke with MRI tech to validate questions for the test.

## 2015-11-09 NOTE — ED Notes (Signed)
Admitting in to see pt at this time.  

## 2015-11-09 NOTE — ED Notes (Signed)
Pt from [facility] with weakness to left side since Saturday. States he was visiting with family, and when they left, he felt weak on left side and went into wheelchair, not telling anyone until this morning because he did not trust any of the staff working the weekend. Pt normally ambulatory, was preparing to be discharged from [facility] soon. Pt has significant weakness in left arm and slight weakness in left leg. Pt alert & oriented with NAD noted.

## 2015-11-09 NOTE — ED Notes (Addendum)
Pt to US at this time.

## 2015-11-10 ENCOUNTER — Encounter: Payer: Self-pay | Admitting: Radiology

## 2015-11-10 ENCOUNTER — Other Ambulatory Visit: Payer: Self-pay | Admitting: *Deleted

## 2015-11-10 ENCOUNTER — Inpatient Hospital Stay: Payer: Medicaid Other

## 2015-11-10 ENCOUNTER — Inpatient Hospital Stay (HOSPITAL_COMMUNITY)
Admit: 2015-11-10 | Discharge: 2015-11-10 | Disposition: A | Discharge status: Home / Self Care | Payer: Medicaid Other | Attending: Internal Medicine | Admitting: Internal Medicine

## 2015-11-10 DIAGNOSIS — I6350 Cerebral infarction due to unspecified occlusion or stenosis of unspecified cerebral artery: Secondary | ICD-10-CM

## 2015-11-10 DIAGNOSIS — I639 Cerebral infarction, unspecified: Secondary | ICD-10-CM

## 2015-11-10 DIAGNOSIS — C099 Malignant neoplasm of tonsil, unspecified: Secondary | ICD-10-CM

## 2015-11-10 LAB — CBC
HCT: 37.8 % — ABNORMAL LOW (ref 40.0–52.0)
Hemoglobin: 12.8 g/dL — ABNORMAL LOW (ref 13.0–18.0)
MCH: 31.5 pg (ref 26.0–34.0)
MCHC: 33.8 g/dL (ref 32.0–36.0)
MCV: 93 fL (ref 80.0–100.0)
PLATELETS: 222 10*3/uL (ref 150–440)
RBC: 4.06 MIL/uL — AB (ref 4.40–5.90)
RDW: 16.2 % — ABNORMAL HIGH (ref 11.5–14.5)
WBC: 5.1 10*3/uL (ref 3.8–10.6)

## 2015-11-10 LAB — BASIC METABOLIC PANEL
Anion gap: 8 (ref 5–15)
BUN: 16 mg/dL (ref 6–20)
CHLORIDE: 108 mmol/L (ref 101–111)
CO2: 22 mmol/L (ref 22–32)
CREATININE: 0.72 mg/dL (ref 0.61–1.24)
Calcium: 9 mg/dL (ref 8.9–10.3)
GFR calc non Af Amer: >60 mL/min (ref >60)
GLUCOSE: 86 mg/dL (ref 65–99)
Potassium: 3.6 mmol/L (ref 3.5–5.1)
Sodium: 138 mmol/L (ref 135–145)

## 2015-11-10 LAB — ECHOCARDIOGRAM COMPLETE
HEIGHTINCHES: 69 in
Weight: 2243.2 oz

## 2015-11-10 LAB — HEMOGLOBIN A1C: Hgb A1c MFr Bld: 5.4 % (ref 4.0–6.0)

## 2015-11-10 LAB — MRSA PCR SCREENING: MRSA BY PCR: NEGATIVE

## 2015-11-10 MED ORDER — IOPAMIDOL (ISOVUE-370) INJECTION 76%
75.0000 mL | Freq: Once | INTRAVENOUS | Status: AC | PRN
  Administered 2015-11-10: 75 mL via INTRAVENOUS

## 2015-11-10 NOTE — Evaluation (Signed)
Occupational Therapy Evaluation Patient Details Name: Ryan Rush MRN: [id number] DOB: July 04, 1960 Today's Date: 11/10/2015    History of Present Illness Pt admitted for R frontal CVA. Pt with history of squamous cell carcinoma of tongue. Pt with history of HTN, cancer with mets to liver/bone. Pt has been on chemo since Feb 2017. Pt then complaints of L UE weakness/numbness x 2 days.   Clinical Impression   Pt. Is a 55 y.o. Male who was admitted with a Right CVA and CA of the tongue with mets to the liver/bone. Pt. Presents with impaired left UE ROM, weakness, impaired LUE motor control and coordination which hinders his ability to use his left UE and hand for ADL and IADL tasks. Pt. Could benefit from skilled OT services for neuromuscular re ed, UE there. Ex, sensory re-ed, ADl/IADL retraining in order to improve ADl functioning and assist with returning to his PLOF.     Follow Up Recommendations  SNF    Equipment Recommendations       Recommendations for Other Services PT consult     Precautions / Restrictions Precautions Precautions: Fall Restrictions Weight Bearing Restrictions: No      Mobility Bed Mobility Overal bed mobility: Independent             General bed mobility comments: safe technique performed. Once seated at EOB, pt with no LOB.  Transfers Overall transfer level: Needs assistance Equipment used: None Transfers: Sit to/from Stand Sit to Stand: Supervision         General transfer comment: safe technique performed with no AD. Pt slightly impulsive.    Balance Overall balance assessment: Needs assistance Sitting-balance support: Feet supported Sitting balance-Leahy Scale: Normal     Standing balance support: No upper extremity supported Standing balance-Leahy Scale: Good                              ADL                                         General ADL Comments: Pt. is able to complete one handed ADL  tasks with his right hand. Pt. is uable to complete 2 handed ADL tasks, and iis unable to engage his left hand to assist with ADL tasks.     Vision     Perception     Praxis      Pertinent Vitals/Pain Pain Assessment: No/denies pain     Hand Dominance Right   Extremity/Trunk Assessment Upper Extremity Assessment Upper Extremity Assessment: LUE deficits/detail LUE Deficits / Details: Left shoulder flexion 3+/5, Abduction 3/5,  Left elbow flexion /extension 3/5, 3-/5 wrist extension, pt. unable to achieve full active supination/pronation, digit extension. LUE Sensation: decreased light touch;decreased proprioception LUE Coordination: decreased fine motor   Lower Extremity Assessment Lower Extremity Assessment: Generalized weakness (L LE grossly 4+/5 except for dorsiflexion 4/5)       Communication Communication Communication: No difficulties   Cognition Arousal/Alertness: Awake/alert Behavior During Therapy: WFL for tasks assessed/performed Overall Cognitive Status: Within Functional Limits for tasks assessed                     General Comments       Exercises Exercises: Other exercises Other Exercises Other Exercises: Supine ther-ex performed on L UE including pronation/supination, bicep curl, and active/assistive shoulder flexion. All while  holding remote in L hand. Ther-ex performed x 10 reps with cga. Pt also performed SAQ with B LE x 10 reps. Cues given for correct technique   Shoulder Instructions      Home Living Family/patient expects to be discharged to:: Skilled nursing facility                                 Additional Comments: has been at [facility] since March. Pt lived alone prior to SNF admission.      Prior Functioning/Environment Level of Independence: Independent             OT Diagnosis: Generalized weakness   OT Problem List: Decreased strength;Decreased range of motion;Decreased coordination;Decreased  knowledge of use of DME or AE;Impaired UE functional use   OT Treatment/Interventions: Self-care/ADL training;Therapeutic activities;DME and/or AE instruction;Therapeutic exercise;Neuromuscular education    OT Goals(Current goals can be found in the care plan section) Acute Rehab OT Goals Patient Stated Goal: To regain the use of his left hand  OT Frequency: Min 1X/week   Barriers to D/C:            Co-evaluation              End of Session Equipment Utilized During Treatment: Gait belt  Activity Tolerance: Patient tolerated treatment well Patient left: in bed;with call bell/phone within reach;with bed alarm set   Time: 0950-1015 OT Time Calculation (min): 25 min Charges:  OT General Charges $OT Visit: 1 Procedure OT Evaluation $OT Eval Moderate Complexity: 1 Procedure G-Codes:    Harrel Carina, MS, OTR/L Harrel Carina 11/10/2015, 11:49 AM

## 2015-11-10 NOTE — Evaluation (Signed)
Clinical/Bedside Swallow Evaluation Patient Details  Name: Ryan Rush MRN: [id number] Date of Birth: [birth year]  Today's Date: 11/10/2015 Time: SLP Start Time (ACUTE ONLY): 0820 SLP Stop Time (ACUTE ONLY): 0900 SLP Time Calculation (min) (ACUTE ONLY): 40 min  Past Medical History:  Past Medical History  Diagnosis Date  . 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 Hypertension   . Tobacco use    Past Surgical History:  Past Surgical History  Procedure Laterality Date  . Peripheral vascular catheterization N/A 08/18/2015    Procedure: Glori Luis Cath Insertion;  Surgeon: Katha Cabal, MD;  Location: [facility];  Service: Cardiovascular;  Laterality: N/A;   HPI:  Ryan Rush is a 55 y.o. 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 Pt currently on regular texutres, thin liquids with medicine whole. Pt passed swallow screening in ED. Skilled ST requested to assess any lingering affects.    Assessment / Plan / Recommendation Clinical Impression  Pt demonstrates functional oropharyngeal abilities. Pt fed self all textures on tray without any s/s of aspiration/dysphagia.  Pt with complete oral clearing, timely swallow initiation and no overt s/s of aspiration. Pt able to communicate complex thoughts intelligiblly and coherently. General aspiration precautions reviewed with pt and he voiced understanding. Skilled ST not indicated at this time. Nursing can request ST be reconsulted if pt condition changes.     Aspiration Risk  No limitations    Diet Recommendation Regular with thin liquids  Medication Administration: Whole meds with liquid    Other  Recommendations Oral Care Recommendations: Oral care BID   Follow up Recommendations  None       Swallow Study   General Date of Onset: 11/09/15 HPI: Ryan Rush is a 55 y.o. 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 Pt currently on regular texutres, thin liquids with medicine whole. Pt passed swallow screening in ED. Skilled ST requested to assess any lingering affects.  Type of Study: Bedside Swallow Evaluation Previous Swallow Assessment: N/A Diet Prior to this Study: Regular;Thin liquids Temperature Spikes Noted: No Respiratory Status: Room air History of Recent Intubation: No Behavior/Cognition: Alert;Cooperative;Pleasant mood Oral Cavity Assessment: Within Functional Limits Oral Care Completed by SLP: No Oral Cavity - Dentition: Adequate natural dentition Vision: Functional for self-feeding Self-Feeding Abilities: Able to feed self Patient Positioning: Upright in bed Baseline Vocal Quality: Normal Volitional Cough: Strong Volitional Swallow: Able to elicit    Oral/Motor/Sensory Function Overall Oral Motor/Sensory Function: Within functional limits   Ice Chips Ice chips: Not tested   Thin Liquid Thin Liquid: Within functional limits Presentation: Self Fed;Straw    Nectar Thick Nectar Thick Liquid: Not tested   Honey Thick Honey Thick Liquid: Not tested   Puree Puree: Not tested   Solid   GO   Solid: Within functional limits Presentation: Self Fed        Reina Wilton 11/10/2015,10:12 AM

## 2015-11-10 NOTE — Consult Note (Signed)
Referring Physician: Tressia Miners    Chief Complaint: Left sided weakness  HPI: Ryan Rush is an 55 y.o. 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  Also felt that he was having some left foot drop as well.  Felt his left side was numb as well.  Numbness and leg weakness have improved but continued to have arm weakness and presented for evaluation.  Initial NIHSS of 2.    Date last known well: 11/07/2015 Time last known well: Time: 17:00 tPA Given: No: Outside time window  MRankin: 0  Past Medical History  Diagnosis Date  . 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 Hypertension   . Tobacco use     Past Surgical History  Procedure Laterality Date  . Peripheral vascular catheterization N/A 08/18/2015    Procedure: Glori Luis Cath Insertion;  Surgeon: Katha Cabal, MD;  Location: [facility];  Service: Cardiovascular;  Laterality: N/A;    Family History  Problem Relation Age of Onset  . Cancer Mother     metastasized   Social History:  reports that he has been smoking Cigarettes.  He has been smoking about 1.00 pack per day. He does not have any smokeless tobacco history on file. He reports that he does not drink alcohol or use illicit drugs.  Allergies: No Known Allergies  Medications:  I have reviewed the patient's current medications. Prior to Admission:  Prescriptions prior to admission  Medication Sig Dispense Refill Last Dose  . amLODipine (NORVASC) 5 MG tablet Take 1 tablet (5 mg total) by mouth daily. 60 tablet 1 unknown at unknown  . diphenoxylate-atropine (LOMOTIL) 2.5-0.025 MG tablet Take 1 tablet by mouth 4 (four) times daily as needed for diarrhea or loose stools. Take it along with immodium 30 tablet 0 prn at prn  . gabapentin  (NEURONTIN) 300 MG capsule Take 2 capsules (600 mg total) by mouth 3 (three) times daily. 180 capsule 3 unknown at unknown  . lidocaine-prilocaine (EMLA) cream Apply 1 application topically as needed. Apply to port a cath site 1 hours before treatments 30 g 3 prn at prn  . loperamide (IMODIUM) 2 MG capsule Take 1 capsule (2 mg total) by mouth as needed for diarrhea or loose stools. 30 capsule 0 prn at prn  . loratadine (CLARITIN) 10 MG tablet Once a day x10 days; START on the day of chemo. 30 tablet 0 unknown at unknown  . magic mouthwash w/lidocaine SOLN Take 5 mLs by mouth 4 (four) times daily as needed for mouth pain. 250 mL 3 prn at prn  . morphine (MS CONTIN) 30 MG 12 hr tablet Take 1 tablet (30 mg total) by mouth every 12 (twelve) hours. 60 tablet 0 unknown at unknown  . nystatin (MYCOSTATIN) 100000 UNIT/ML suspension Take 5 mLs (500,000 Units total) by mouth 4 (four) times daily. 60 mL 0 unknown at unknown  . ondansetron (ZOFRAN) 8 MG tablet Take 1 tablet (8 mg total) by mouth every 8 (eight) hours as needed for nausea or vomiting. 30 tablet 0 prn at prn  . pantoprazole (PROTONIX) 40 MG tablet Take 1 tablet (40 mg total) by mouth daily. 60 tablet 3 unknown at unknown  . potassium chloride SA (K-DUR,KLOR-CON) 20 MEQ tablet  Take 2 tablets (40 mEq total) by mouth 2 (two) times daily. 30 tablet 3 unknown at unknown  . prochlorperazine (COMPAZINE) 10 MG tablet Take 1 tablet (10 mg total) by mouth every 6 (six) hours as needed for nausea or vomiting. 60 tablet 0 prn at prn  . ranitidine (ZANTAC) 75 MG tablet Take 75 mg by mouth daily.   unknown at unknown   Scheduled: . amLODipine  5 mg Oral Daily  . aspirin  81 mg Oral Daily  . atorvastatin  40 mg Oral q1800  . enoxaparin (LOVENOX) injection  40 mg Subcutaneous Q24H  . famotidine  20 mg Oral Daily  . gabapentin  600 mg Oral TID  . morphine  30 mg Oral Q12H  . nicotine  21 mg Transdermal Daily  . pantoprazole  40 mg Oral Daily  . sodium  chloride flush  3 mL Intravenous Q12H    ROS: History obtained from the patient  General ROS: negative for - chills, fatigue, fever, night sweats, weight gain or weight loss Psychological ROS: negative for - behavioral disorder, hallucinations, memory difficulties, mood swings or suicidal ideation Ophthalmic ROS: negative for - blurry vision, double vision, eye pain or loss of vision ENT ROS: negative for - epistaxis, nasal discharge, oral lesions, sore throat, tinnitus or vertigo Allergy and Immunology ROS: negative for - hives or itchy/watery eyes Hematological and Lymphatic ROS: negative for - bleeding problems, bruising or swollen lymph nodes Endocrine ROS: negative for - galactorrhea, hair pattern changes, polydipsia/polyuria or temperature intolerance Respiratory ROS: negative for - cough, hemoptysis, shortness of breath or wheezing Cardiovascular ROS: negative for - chest pain, dyspnea on exertion, edema or irregular heartbeat Gastrointestinal ROS: negative for - abdominal pain, diarrhea, hematemesis, nausea/vomiting or stool incontinence Genito-Urinary ROS: negative for - dysuria, hematuria, incontinence or urinary frequency/urgency Musculoskeletal ROS: negative for - joint swelling or muscular weakness Neurological ROS: as noted in HPI Dermatological ROS: negative for rash and skin lesion changes  Physical Examination: Blood pressure 152/73, pulse 85, temperature 98 F (36.7 C), temperature source Oral, resp. rate 22, height 5\' 9"  (1.753 m), weight 63.594 kg (140 lb 3.2 oz), SpO2 97 %.  HEENT-  Normocephalic, no lesions, without obvious abnormality.  Normal external eye and conjunctiva.  Normal TM's bilaterally.  Normal auditory canals and external ears. Normal external nose, mucus membranes and septum.  Normal pharynx. Cardiovascular- S1, S2 normal, pulses palpable throughout   Lungs- chest clear, no wheezing, rales, normal symmetric air entry Abdomen- soft, non-tender; bowel  sounds normal; no masses,  no organomegaly Extremities- no edema Lymph-no adenopathy palpable Musculoskeletal-no joint tenderness, deformity or swelling Skin-warm and dry, no hyperpigmentation, vitiligo, or suspicious lesions  Neurological Examination Mental Status: Alert, oriented, thought content appropriate.  Speech fluent without evidence of aphasia.  Able to follow 3 step commands without difficulty. Cranial Nerves: II: Discs flat bilaterally; Visual fields grossly normal, pupils equal, round, reactive to light and accommodation III,IV, VI: ptosis not present, extra-ocular motions intact bilaterally V,VII: mild decrease in left NLF, facial light touch sensation normal bilaterally VIII: hearing normal bilaterally IX,X: gag reflex present XI: bilateral shoulder shrug XII: midline tongue extension Motor: Right : Upper extremity   5/5    Left:     Upper extremity   3/5  Lower extremity   5/5     Lower extremity   5-/5 Tone and bulk:normal tone throughout; no atrophy noted Sensory: Pinprick and light touch decreased in the left foot Deep Tendon Reflexes: 2+ and symmetric  throughout Plantars: Right: downgoing   Left: downgoing Cerebellar: Normal finger-to-nose testing on the right, limited on the left due to weakness.  Normal heel-to-shin testing bilaterally Gait: not tested due to safety concerns      Laboratory Studies:  Basic Metabolic Panel:  Recent Labs Lab 11/09/15 0955 11/10/15 0513  NA 135 138  K 4.1 3.6  CL 101 108  CO2 25 22  GLUCOSE 94 86  BUN 16 16  CREATININE 0.78 0.72  CALCIUM 9.7 9.0    Liver Function Tests:  Recent Labs Lab 11/09/15 0955  AST 25  ALT 14*  ALKPHOS 91  BILITOT 0.6  PROT 8.3*  ALBUMIN 4.2   No results for input(s): LIPASE, AMYLASE in the last 168 hours. No results for input(s): AMMONIA in the last 168 hours.  CBC:  Recent Labs Lab 11/09/15 0955 11/10/15 0513  WBC 7.1 5.1  NEUTROABS 5.3  --   HGB 14.0 12.8*  HCT 41.8  37.8*  MCV 94.5 93.0  PLT 256 222    Cardiac Enzymes:  Recent Labs Lab 11/09/15 0955  TROPONINI <0.03    BNP: Invalid input(s): POCBNP  CBG: No results for input(s): GLUCAP in the last 168 hours.  Microbiology: Results for orders placed or performed during the hospital encounter of 11/09/15  MRSA PCR Screening     Status: None   Collection Time: 11/10/15  4:49 AM  Result Value Ref Range Status   MRSA by PCR NEGATIVE NEGATIVE Final    Comment:        The GeneXpert MRSA Assay (FDA approved for NASAL specimens only), is one component of a comprehensive MRSA colonization surveillance program. It is not intended to diagnose MRSA infection nor to guide or monitor treatment for MRSA infections.     Coagulation Studies:  Recent Labs  11/09/15 0955  LABPROT 14.7  INR 1.13    Urinalysis: No results for input(s): COLORURINE, LABSPEC, PHURINE, GLUCOSEU, HGBUR, BILIRUBINUR, KETONESUR, PROTEINUR, UROBILINOGEN, NITRITE, LEUKOCYTESUR in the last 168 hours.  Invalid input(s): APPERANCEUR  Lipid Panel:    Component Value Date/Time   CHOL 164 11/09/2015 1845   TRIG 100 11/09/2015 1845   HDL 30* 11/09/2015 1845   CHOLHDL 5.5 11/09/2015 1845   VLDL 20 11/09/2015 1845   LDLCALC 114* 11/09/2015 1845    HgbA1C: No results found for: HGBA1C  Urine Drug Screen:     Component Value Date/Time   LABOPIA POSITIVE* 11/09/2015 1629   COCAINSCRNUR NONE DETECTED 11/09/2015 1629   LABBENZ NONE DETECTED 11/09/2015 1629   AMPHETMU NONE DETECTED 11/09/2015 1629   THCU NONE DETECTED 11/09/2015 1629   LABBARB NONE DETECTED 11/09/2015 1629    Alcohol Level: No results for input(s): ETH in the last 168 hours.  Other results: EKG: sinus rhythm at 91 bpm.  Imaging: Ct Head Wo Contrast  11/09/2015  CLINICAL DATA:  Left-sided numbness, weakness since Saturday. EXAM: CT HEAD WITHOUT CONTRAST TECHNIQUE: Contiguous axial images were obtained from the base of the skull through the vertex  without intravenous contrast. COMPARISON:  None. FINDINGS: No acute intracranial abnormality. Specifically, no hemorrhage, hydrocephalus, mass lesion, acute infarction, or significant intracranial injury. No acute calvarial abnormality. Visualized paranasal sinuses and mastoids clear. Orbital soft tissues unremarkable. IMPRESSION: No acute intracranial abnormality. Electronically Signed   By: Rolm Baptise M.D.   On: 11/09/2015 12:20   Mr Jeri Cos [id number] Contrast  11/09/2015  CLINICAL DATA:  Metastatic squamous cell carcinoma of the tongue. Response to therapy based on recent PET scan  from May 2017. Acute onset of LEFT-sided weakness. EXAM: MRI HEAD WITHOUT AND WITH CONTRAST TECHNIQUE: Multiplanar, multiecho pulse sequences of the brain and surrounding structures were obtained without and with intravenous contrast. CONTRAST:  35mL MULTIHANCE GADOBENATE DIMEGLUMINE 529 MG/ML IV SOLN COMPARISON:  CT head earlier today. FINDINGS: Restricted diffusion involving the RIGHT frontal opercular cortex, RIGHT posterior frontal cortex, and adjacent subcortical white matter. No similar abnormalities on the LEFT, elsewhere in the RIGHT hemisphere, or in the posterior fossa. No hemorrhage, mass lesion, hydrocephalus, or extra-axial fluid. Global atrophy. Mild to moderate T2 and FLAIR hyperintensities throughout the white matter, likely chronic microvascular ischemic change. Punctate foci of susceptibility in the basal ganglia bilaterally associated with brain substance loss likely representing small hemorrhagic lacunar infarcts of a chronic nature. Nonhemorrhagic lacunar infarcts also noted in the RIGHT callosal periventricular white matter, LEFT caudate, brainstem, and cerebellum. Flow voids are maintained.  No proximal large vessel occlusion. No midline abnormality. Postcontrast, no abnormal enhancement. No evidence for intracranial metastatic disease. Major dural venous sinuses are patent. No acute sinus or mastoid disease.   Negative orbits. Enhancing, enlarged RIGHT retropharyngeal node of Rouviere 13 x 14 mm cross-section, image 12 series 12, likely metastatic disease given its ipsilateral location. Incompletely evaluated, but suspected asymmetric enhancing oropharyngeal tumor as seen on the most inferior postcontrast slice series 12, image 1. Ipsilateral RIGHT level 2 nodes, incompletely evaluated on diffusion, likely residual disease. IMPRESSION: Acute nonhemorrhagic RIGHT frontal infarct, RIGHT MCA M3 branch distribution. No evidence for large vessel occlusion. Premature for age atrophy with widespread areas of old infarction, some of which display trace amount of chronic hemorrhage. While recent PET scan demonstrated positive response to therapy, there are areas of concern with regard to residual metastatic oropharyngeal squamous cell carcinoma, most notably a greater than 1 cm RIGHT retropharyngeal node. No intracranial metastatic disease is present however. Electronically Signed   By: Staci Righter M.D.   On: 11/09/2015 16:41   US Carotid Bilateral  11/09/2015  CLINICAL DATA:  55 year old male with left-sided weakness EXAM: BILATERAL CAROTID DUPLEX ULTRASOUND TECHNIQUE: Pearline Cables scale imaging, color Doppler and duplex ultrasound were performed of bilateral carotid and vertebral arteries in the neck. COMPARISON:  Head CT obtained earlier today FINDINGS: Criteria: Quantification of carotid stenosis is based on velocity parameters that correlate the residual internal carotid diameter with NASCET-based stenosis levels, using the diameter of the distal internal carotid lumen as the denominator for stenosis measurement. The following velocity measurements were obtained: RIGHT ICA:  193/35 cm/sec CCA:  [id number] cm/sec SYSTOLIC ICA/CCA RATIO:  1.3 DIASTOLIC ICA/CCA RATIO:  2.6 ECA:  95 cm/sec LEFT ICA:  115/23 cm/sec CCA:  [id number] cm/sec SYSTOLIC ICA/CCA RATIO:  1.2 DIASTOLIC ICA/CCA RATIO:  2.3 ECA:  424 cm/sec RIGHT CAROTID ARTERY:  Heterogeneous atherosclerotic plaque in the distal common carotid artery extending into the proximal internal carotid artery. By peak systolic velocity criteria the stenosis falls in the 50- 69% range. RIGHT VERTEBRAL ARTERY:  Patent with normal antegrade flow. LEFT CAROTID ARTERY: Heterogeneous atherosclerotic plaque beginning in the mid common carotid artery. Irregular partially calcified plaque at the bifurcation extends into the internal and external carotid arteries. There is significant elevation of the peak systolic velocity in the external carotid artery consistent with a greater than 70% diameter stenosis. By peak systolic velocity criteria the internal carotid stenosis remains less than 50%. LEFT VERTEBRAL ARTERY:  Patent with normal antegrade flow. Other: Bulky bilateral cervical chain adenopathy. Patient has known head and neck cancer with recent  PET-CT imaging. IMPRESSION: 1. Moderate (50-69%) stenosis proximal right internal carotid artery secondary to heterogenous atherosclerotic plaque. 2. Mild (1-49%) stenosis proximal left internal carotid artery secondary to heterogenous atherosclerotic plaque. 3. Vertebral arteries are patent with normal antegrade flow. 4. Incidental note is made of a hemodynamically significant stenosis in the left external carotid artery. Signed, Criselda Peaches, MD [facility] Electronically Signed   By: Jacqulynn Cadet M.D.   On: 11/09/2015 15:56    Assessment: 55 y.o. male with a history of metastatic squamous cell carcinoma who presents with new onset left sided weakness.  MRI of the brain personally reviewed and shows an acute right frontal infarct.  Echocardiogram pending.  Carotid dopplers show a 0000000 RICA stenosis, LICA not hemodynamically significant.  LDL 114.  Patient on no antiplatelet therapy at home.  Stroke Risk Factors - hypertension and smoking  Plan: 1. HgbA1c 2. PT consult, OT  consult, Speech consult 3. Echocardiogram pending 4. CTA head and neck 5. Prophylactic therapy-Antiplatelet med: Aspirin - dose 325mg  daily 6. NPO until RN stroke swallow screen 7. Telemetry monitoring 8. Frequent neuro checks 9. Statin for lipid management with target LDL<70.     Alexis Goodell, MD Neurology [phone] 11/10/2015, 11:58 AM

## 2015-11-10 NOTE — Progress Notes (Signed)
Pt is alert and oriented x 4, LUE with severe weakness, on room air, IV fluids infusing, denies pain, angigram of head/neck performed, consulted with neurologist, awaiting on oncology consult.

## 2015-11-10 NOTE — Evaluation (Signed)
Physical Therapy Evaluation Patient Details Name: Ryan Rush MRN: [id number] DOB: 1961-02-06 Today's Date: 11/10/2015   History of Present Illness  Pt admitted for R frontal CVA. Pt with history of squamous cell carcinoma of tongue. Pt with history of HTN, cancer with mets to lever/bone. Pt has been on chemo since Feb 2017. Pt then complaints of L UE weakness/numbness x 2 days.  Clinical Impression  Pt is a pleasant 55 year old male who was admitted for positive CVA. Pt performs bed mobility with independence, transfers with supervision, and ambulation with cga and no AD. Pt demonstrates deficits with strength/coordination/mobility. Pt with decreased L UE coordination with rapid alt. Movements. Pt with slight balance deficits, would benefit from BERG testing. Pt from facility and has plans to return, needs continued therapy to decrease functional deficits. Pt motivated to perform therapy. Would benefit from skilled PT to address above deficits and promote optimal return to PLOF.      Follow Up Recommendations SNF    Equipment Recommendations       Recommendations for Other Services       Precautions / Restrictions Precautions Precautions: Fall Restrictions Weight Bearing Restrictions: No      Mobility  Bed Mobility Overal bed mobility: Independent             General bed mobility comments: safe technique performed. Once seated at EOB, pt with no LOB.  Transfers Overall transfer level: Needs assistance Equipment used: None Transfers: Sit to/from Stand Sit to Stand: Supervision         General transfer comment: safe technique performed with no AD. Pt slightly impulsive.  Ambulation/Gait Ambulation/Gait assistance: Min guard Ambulation Distance (Feet): 200 Feet Assistive device: None Gait Pattern/deviations: Step-to pattern     General Gait Details: ambulated using reciprocal gait pattern. Pt with slight decreased step length on L foot, however no toe drag  noted. Good gait speed and no LOB. Increased lateral sway during ambulation.  Stairs            Wheelchair Mobility    Modified Rankin (Stroke Patients Only)       Balance Overall balance assessment: Needs assistance Sitting-balance support: Feet supported Sitting balance-Leahy Scale: Normal     Standing balance support: No upper extremity supported Standing balance-Leahy Scale: Good                               Pertinent Vitals/Pain Pain Assessment: No/denies pain    Home Living Family/patient expects to be discharged to:: Skilled nursing facility                 Additional Comments: has been at [facility] since March. Pt lived alone prior to SNF admission.    Prior Function Level of Independence: Independent               Hand Dominance   Dominant Hand: Right    Extremity/Trunk Assessment   Upper Extremity Assessment: LUE deficits/detail       LUE Deficits / Details: Distal weakness compared to proximal. L elbow flexors/extensors grossly 3+/5; 3/5 grip strength   Lower Extremity Assessment: Generalized weakness (L LE grossly 4+/5 except for dorsiflexion 4/5)         Communication   Communication: No difficulties  Cognition Arousal/Alertness: Awake/alert Behavior During Therapy: WFL for tasks assessed/performed Overall Cognitive Status: Within Functional Limits for tasks assessed  General Comments      Exercises Other Exercises Other Exercises: Supine ther-ex performed on L UE including pronation/supination, bicep curl, and active/assistive shoulder flexion. All while holding remote in L hand. Ther-ex performed x 10 reps with cga. Pt also performed SAQ with B LE x 10 reps. Cues given for correct technique      Assessment/Plan    PT Assessment Patient needs continued PT services  PT Diagnosis Difficulty walking;Generalized weakness   PT Problem List Decreased strength  PT Treatment  Interventions DME instruction;Gait training;Therapeutic exercise;Balance training   PT Goals (Current goals can be found in the Care Plan section) Acute Rehab PT Goals Patient Stated Goal: to get stronger PT Goal Formulation: With patient Time For Goal Achievement: 11/24/15 Potential to Achieve Goals: Good    Frequency 7X/week   Barriers to discharge        Co-evaluation               End of Session Equipment Utilized During Treatment: Gait belt Activity Tolerance: Patient tolerated treatment well Patient left: in bed;with bed alarm set Nurse Communication: Mobility status         Time: [id number] PT Time Calculation (min) (ACUTE ONLY): 22 min   Charges:   PT Evaluation $PT Eval Moderate Complexity: 1 Procedure PT Treatments $Therapeutic Exercise: 8-22 mins   PT G Codes:        Doran Nestle 2015/11/24, 11:35 AM  Greggory Stallion, PT, DPT [phone]

## 2015-11-10 NOTE — Progress Notes (Signed)
[facility] [place] NAME: Ryan Rush    MR#:  [id number]  DATE OF BIRTH:  04-Sep-1960  SUBJECTIVE:  CHIEF COMPLAINT:   Chief Complaint  Patient presents with  . Cerebrovascular Accident   Admitted with left arm weakness, MRI with acute infarct- persistent weakness noted. From [facility] prior to admission   REVIEW OF SYSTEMS:  Review of Systems  Constitutional: Negative for fever, chills and malaise/fatigue.  HENT: Negative for ear discharge, ear pain and nosebleeds.   Eyes: Negative for blurred vision and double vision.  Respiratory: Negative for cough, shortness of breath and wheezing.   Cardiovascular: Negative for chest pain, palpitations and leg swelling.  Gastrointestinal: Negative for nausea, vomiting, abdominal pain, diarrhea and constipation.  Genitourinary: Negative for dysuria and urgency.  Musculoskeletal: Negative for myalgias.  Neurological: Positive for focal weakness. Negative for dizziness, sensory change, speech change, seizures and headaches.       Left arm weakness  Psychiatric/Behavioral: Negative for depression.    DRUG ALLERGIES:  No Known Allergies  VITALS:  Blood pressure 152/73, pulse 85, temperature 98 F (36.7 C), temperature source Oral, resp. rate 22, height 5\' 9"  (1.753 m), weight 63.594 kg (140 lb 3.2 oz), SpO2 97 %.  PHYSICAL EXAMINATION:  Physical Exam  GENERAL: 55 y.o.-year-old patient lying in the bed with no acute distress.  EYES: Pupils equal, round, reactive to light and accommodation. No scleral icterus. Extraocular muscles intact.  HEENT: Head atraumatic, normocephalic. Oropharynx and nasopharynx clear. Mild erythema of posterior pharyngeal wall. No facial droop.  NECK: Supple, no jugular venous distention. No thyroid enlargement, no tenderness.  LUNGS: Normal breath sounds bilaterally, no wheezing, rales,rhonchi or crepitation. No use of accessory muscles of  respiration.  CARDIOVASCULAR: S1, S2 normal. No murmurs, rubs, or gallops.  ABDOMEN: Soft, nontender, nondistended. Bowel sounds present. No organomegaly or mass.  EXTREMITIES: No pedal edema, cyanosis, or clubbing.  NEUROLOGIC: Cranial nerves II through XII are intact. Muscle strength 5/5 in all extremities except 3/5 strength of left arm. Sensation intact. Gait not checked.  PSYCHIATRIC: The patient is alert and oriented x 3.  SKIN: No obvious rash, lesion, or ulcer.    LABORATORY PANEL:   CBC  Recent Labs Lab 11/10/15 0513  WBC 5.1  HGB 12.8*  HCT 37.8*  PLT 222   ------------------------------------------------------------------------------------------------------------------  Chemistries   Recent Labs Lab 11/09/15 0955 11/10/15 0513  NA 135 138  K 4.1 3.6  CL 101 108  CO2 25 22  GLUCOSE 94 86  BUN 16 16  CREATININE 0.78 0.72  CALCIUM 9.7 9.0  AST 25  --   ALT 14*  --   ALKPHOS 91  --   BILITOT 0.6  --    ------------------------------------------------------------------------------------------------------------------  Cardiac Enzymes  Recent Labs Lab 11/09/15 0955  TROPONINI <0.03   ------------------------------------------------------------------------------------------------------------------  RADIOLOGY:  Ct Head Wo Contrast  11/09/2015  CLINICAL DATA:  Left-sided numbness, weakness since Saturday. EXAM: CT HEAD WITHOUT CONTRAST TECHNIQUE: Contiguous axial images were obtained from the base of the skull through the vertex without intravenous contrast. COMPARISON:  None. FINDINGS: No acute intracranial abnormality. Specifically, no hemorrhage, hydrocephalus, mass lesion, acute infarction, or significant intracranial injury. No acute calvarial abnormality. Visualized paranasal sinuses and mastoids clear. Orbital soft tissues unremarkable. IMPRESSION: No acute intracranial abnormality. Electronically Signed   By: Rolm Baptise M.D.   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 Response to therapy based on recent PET scan from May 2017. Acute onset of LEFT-sided weakness. EXAM: MRI HEAD WITHOUT AND WITH CONTRAST TECHNIQUE: Multiplanar, multiecho pulse sequences of the brain and surrounding structures were obtained without and with intravenous contrast. CONTRAST:  69mL MULTIHANCE GADOBENATE DIMEGLUMINE 529 MG/ML IV SOLN COMPARISON:  CT head earlier today. FINDINGS: Restricted diffusion involving the RIGHT frontal opercular cortex, RIGHT posterior frontal cortex, and adjacent subcortical white matter. No similar abnormalities on the LEFT, elsewhere in the RIGHT hemisphere, or in the posterior fossa. No hemorrhage, mass lesion, hydrocephalus, or extra-axial fluid. Global atrophy. Mild to moderate T2 and FLAIR hyperintensities throughout the white matter, likely chronic microvascular ischemic change. Punctate foci of susceptibility in the basal ganglia bilaterally associated with brain substance loss likely representing small hemorrhagic lacunar infarcts of a chronic nature. Nonhemorrhagic lacunar infarcts also noted in the RIGHT callosal periventricular white matter, LEFT caudate, brainstem, and cerebellum. Flow voids are maintained.  No proximal large vessel occlusion. No midline abnormality. Postcontrast, no abnormal enhancement. No evidence for intracranial metastatic disease. Major dural venous sinuses are patent. No acute sinus or mastoid disease.  Negative orbits. Enhancing, enlarged RIGHT retropharyngeal node of [place] 13 x 14 mm cross-section, image 12 series 12, likely metastatic disease given its ipsilateral location. Incompletely evaluated, but suspected asymmetric enhancing oropharyngeal tumor as seen on the most inferior postcontrast slice series 12, image 1. Ipsilateral RIGHT level 2 nodes, incompletely evaluated on diffusion, likely residual disease. IMPRESSION:  Acute nonhemorrhagic RIGHT frontal infarct, RIGHT MCA M3 branch distribution. No evidence for large vessel occlusion. Premature for age atrophy with widespread areas of old infarction, some of which display trace amount of chronic hemorrhage. While recent PET scan demonstrated positive response to therapy, there are areas of concern with regard to residual metastatic oropharyngeal squamous cell carcinoma, most notably a greater than 1 cm RIGHT retropharyngeal node. No intracranial metastatic disease is present however. Electronically Signed   By: Staci Righter M.D.   On: 11/09/2015 16:41   US Carotid Bilateral  11/09/2015  CLINICAL DATA:  55 year old male with left-sided weakness EXAM: BILATERAL CAROTID DUPLEX ULTRASOUND TECHNIQUE: Pearline Cables scale imaging, color Doppler and duplex ultrasound were performed of bilateral carotid and vertebral arteries in the neck. COMPARISON:  Head CT obtained earlier today FINDINGS: Criteria: Quantification of carotid stenosis is based on velocity parameters that correlate the residual internal carotid diameter with NASCET-based stenosis levels, using the diameter of the distal internal carotid lumen as the denominator for stenosis measurement. The following velocity measurements were obtained: RIGHT ICA:  193/35 cm/sec CCA:  [id number] cm/sec SYSTOLIC ICA/CCA RATIO:  1.3 DIASTOLIC ICA/CCA RATIO:  2.6 ECA:  95 cm/sec LEFT ICA:  115/23 cm/sec CCA:  [id number] cm/sec SYSTOLIC ICA/CCA RATIO:  1.2 DIASTOLIC ICA/CCA RATIO:  2.3 ECA:  424 cm/sec RIGHT CAROTID ARTERY: Heterogeneous atherosclerotic plaque in the distal common carotid artery extending into the proximal internal carotid artery. By peak systolic velocity criteria the stenosis falls in the 50- 69% range. RIGHT VERTEBRAL ARTERY:  Patent with normal antegrade flow. LEFT CAROTID ARTERY: Heterogeneous atherosclerotic plaque beginning in the mid common carotid artery. Irregular partially calcified plaque at the bifurcation extends into the  internal and external carotid arteries. There is significant elevation of the peak systolic velocity in the external carotid artery consistent with a greater than 70% diameter stenosis. By peak systolic velocity criteria the internal carotid stenosis remains less than 50%. LEFT VERTEBRAL ARTERY:  Patent with normal antegrade flow. Other: Bulky  bilateral cervical chain adenopathy. Patient has known head and neck cancer with recent PET-CT imaging. IMPRESSION: 1. Moderate (50-69%) stenosis proximal right internal carotid artery secondary to heterogenous atherosclerotic plaque. 2. Mild (1-49%) stenosis proximal left internal carotid artery secondary to heterogenous atherosclerotic plaque. 3. Vertebral arteries are patent with normal antegrade flow. 4. Incidental note is made of a hemodynamically significant stenosis in the left external carotid artery. Signed, Criselda Peaches, MD [facility] Radiology Electronically Signed   By: Jacqulynn Cadet M.D.   On: 11/09/2015 15:56    EKG:   Orders placed or performed during the hospital encounter of 11/09/15  . ED EKG  . ED EKG  . EKG 12-Lead  . EKG 12-Lead    ASSESSMENT AND PLAN:   Jermel Pawelski is a 55 y.o. 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 No metastases noted in brain. - carotid dopplers with moderate stenosis on the right - CTA of head and neck ordered - ECHO done and pending -Neurology consulted - Physical therapy, occupational therapy and speech therapy consults requested  #2 hypertension- continue  norvasc  #3 Neuropathy- worsened with chemo- on neurontin, in both lower extremities- worse on the left side No new changes  #4 Bone mets with chronic pain- continue MS contin And Pain medications  #5 Metastatic squamous cell carcinoma of tongue base- s/p 3 cycles of chemo with improvement on PET scan, patient having neuro side effects- so plans to start [place] starting this week Oncology consulted  #6 DVT Prophylaxis- lovenox   Plan is to return back to [facility] for physical therapy   All the records are reviewed and case discussed with Care Management/Social Workerr. Management plans discussed with the patient, family and they are in agreement.  CODE STATUS:  Full code  TOTAL TIME TAKING CARE OF THIS PATIENT: 32 minutes.   POSSIBLE D/C IN 1-2 DAYS, DEPENDING ON CLINICAL CONDITION.   Gladstone Lighter M.D on 11/10/2015 at 1:07 PM  Between 7am to 6pm - Pager - [phone]  After 6pm go to [email or web address] - password [email or web address] [facility]  Office  [phone]  CC: Primary care physician; Kirk Ruths., MD

## 2015-11-10 NOTE — NC FL2 (Signed)
[place] LEVEL OF CARE SCREENING TOOL     IDENTIFICATION  Patient Name: Ryan Rush Birthdate: 1960/12/28 Sex: male Admission Date (Current Location): 11/09/2015  [place] and [place] Number:  Engineering geologist and Address:  [facility], [address], [place], [place] [address]      Provider Number: [id number]  Attending Physician Name and Address:  Gladstone Lighter, MD  Relative Name and Phone Number:       Current Level of Care: Hospital Recommended Level of Care: [place] Prior Approval Number:    Date Approved/Denied:   PASRR Number:  ([id number])  Discharge Plan: SNF    Current Diagnoses: Patient Active Problem List   Diagnosis Date Noted  . CVA (cerebral infarction) 11/09/2015  . Cancer ([place]) 08/17/2015  . 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    Orientation RESPIRATION BLADDER Height & Weight     Self, Time, Situation, Place  Normal Continent Weight: 140 lb 3.2 oz (63.594 kg) Height:  5\' 9"  (175.3 cm)  BEHAVIORAL SYMPTOMS/MOOD NEUROLOGICAL BOWEL NUTRITION STATUS   (None)  (None) Continent Diet (2 gram )  AMBULATORY STATUS COMMUNICATION OF NEEDS Skin   Extensive Assist Verbally Normal                       Personal Care Assistance Level of Assistance  Bathing, Feeding, Dressing Bathing Assistance: Limited assistance Feeding assistance: Independent Dressing Assistance: Limited assistance     Functional Limitations Info  Sight, Hearing, Speech Sight Info: Adequate Hearing Info: Adequate Speech Info: Adequate    SPECIAL CARE FACTORS FREQUENCY                       Contractures      Additional Factors Info  Code Status, Allergies Code Status Info:  (Full Code) Allergies Info:  (No Known Allergies)           Current Medications (11/10/2015):  This is the current hospital active medication list Current Facility-Administered Medications  Medication  Dose Route Frequency Provider Last Rate Last Dose  . 0.9 %  sodium chloride infusion   Intravenous Continuous Gladstone Lighter, MD 75 mL/hr at 11/10/15 [phone]    . acetaminophen (TYLENOL) tablet 650 mg  650 mg Oral Q6H PRN Gladstone Lighter, MD       Or  . acetaminophen (TYLENOL) suppository 650 mg  650 mg Rectal Q6H PRN Gladstone Lighter, MD      . amLODipine (NORVASC) tablet 5 mg  5 mg Oral Daily Gladstone Lighter, MD   5 mg at 11/10/15 0945  . aspirin chewable tablet 81 mg  81 mg Oral Daily Gladstone Lighter, MD   81 mg at 11/10/15 0945  . atorvastatin (LIPITOR) tablet 40 mg  40 mg Oral q1800 Gladstone Lighter, MD      . enoxaparin (LOVENOX) injection 40 mg  40 mg Subcutaneous Q24H Gladstone Lighter, MD   40 mg at 11/09/15 2041  . famotidine (PEPCID) tablet 20 mg  20 mg Oral Daily Gladstone Lighter, MD   20 mg at 11/10/15 0945  . gabapentin (NEURONTIN) capsule 600 mg  600 mg Oral TID Gladstone Lighter, MD   600 mg at 11/10/15 0945  . loperamide (IMODIUM) capsule 2 mg  2 mg Oral PRN Gladstone Lighter, MD      . magic mouthwash w/lidocaine  5 mL Oral QID PRN Gladstone Lighter, MD      . morphine (MS CONTIN) 12  hr tablet 30 mg  30 mg Oral Q12H Gladstone Lighter, MD   30 mg at 11/10/15 0945  . nicotine (NICODERM CQ - dosed in mg/24 hours) patch 21 mg  21 mg Transdermal Daily Gladstone Lighter, MD      . ondansetron (ZOFRAN) tablet 4 mg  4 mg Oral Q6H PRN Gladstone Lighter, MD       Or  . ondansetron (ZOFRAN) injection 4 mg  4 mg Intravenous Q6H PRN Gladstone Lighter, MD      . pantoprazole (PROTONIX) EC tablet 40 mg  40 mg Oral Daily Gladstone Lighter, MD   40 mg at 11/10/15 0945  . sodium chloride flush (NS) 0.9 % injection 3 mL  3 mL Intravenous Q12H Gladstone Lighter, MD   3 mL at 11/09/15 2200     Discharge Medications: Please see discharge summary for a list of discharge medications.  Relevant Imaging Results:  Relevant Lab Results:   Additional Information  (SSN  [id number])  Ryan Quarry Mariamawit Depaoli, LCSW

## 2015-11-10 NOTE — Clinical Social Work Note (Signed)
Clinical Social Work Assessment  Patient Details  Name: Ryan Rush MRN: [id number] Date of Birth: May 23, 1961  Date of referral:  11/10/15               Reason for consult:  Discharge Planning                Permission sought to share information with:    Permission granted to share information::     Name::        Agency::     Relationship::     Contact Information:     Housing/Transportation Living arrangements for the past 2 months:  [place] Oceanographer) Source of Information:  Patient Patient Interpreter Needed:  None Criminal Activity/Legal Involvement Pertinent to Current Situation/Hospitalization:  No - Comment as needed Significant Relationships:  Other Family Members Lives with:  Facility Resident Oceanographer) Do you feel safe going back to the place where you live?  Yes Need for family participation in patient care:  No (Coment)  Care giving concerns:  PT and OT recommended SNF placement   Social Worker assessment / plan:  CSW was informed by Marden Noble- Development worker, international aid at [facility] that patient is a resident at their facility. He reported that patient does not have insurance and they accepted him into [facility] with his Medicaid pending. Reported that patient can return at discharge. CSW informed Marden Noble that PT is recommending SNF for patient. Informed Doug that patient ambulated 200 feet. Per Marden Noble patient does not qualify for SNF level PT ambulating this amount. CSW consulted another CSW and was told that he was not appropriate for SNF level PT due to the distance that he ambulated. CSW met with patient at bedside. He reports that he's from [facility] and he'll like to return at discharge. Stated that he would like to be transported via EMS at discharge. Stated he had no other mean of transportation.  Patient will return to [facility] at discharge. FL2/ PASRR completed, Placed in [facility] [employer] and faxed to  [facility]. CSW will continue to follow and assist.   Employment status:  Disabled (Comment on whether or not currently receiving Disability) Insurance information:  Self Pay (Medicaid Pending) PT Recommendations:  [place] / Referral to community resources:  [place]  Patient/Family's Response to care:  Patient reports that he's ready to go back to [facility].   Patient/Family's Understanding of and Emotional Response to Diagnosis, Current Treatment, and Prognosis:  Patient reports he understands why he's in the hospital and that he's ready to go. Stated that he will return to [facility] and that he appreciates CSW's assistance with coordinating it.   Emotional Assessment Appearance:  Appears stated age Attitude/Demeanor/Rapport:   (None) Affect (typically observed):  Calm, Pleasant Orientation:  Oriented to Self, Oriented to Place, Oriented to  Time, Oriented to Situation Alcohol / Substance use:  Tobacco Use Psych involvement (Current and /or in the community):  No (Comment)  Discharge Needs  Concerns to be addressed:  Discharge Planning Concerns Readmission within the last 30 days:  No Current discharge risk:  Chronically ill Barriers to Discharge:  Continued Medical Work up   [employer], LCSW 11/10/2015, 4:43 PM

## 2015-11-10 NOTE — Progress Notes (Signed)
*  PRELIMINARY RESULTS* Echocardiogram 2D Echocardiogram has been performed.  Ryan Rush 11/10/2015, 2:50 PM

## 2015-11-10 NOTE — Care Management (Signed)
Patient confirmed he lives at [facility]. CSW updated.

## 2015-11-11 DIAGNOSIS — Z9221 Personal history of antineoplastic chemotherapy: Secondary | ICD-10-CM

## 2015-11-11 DIAGNOSIS — M21372 Foot drop, left foot: Secondary | ICD-10-CM

## 2015-11-11 DIAGNOSIS — R531 Weakness: Secondary | ICD-10-CM

## 2015-11-11 DIAGNOSIS — C01 Malignant neoplasm of base of tongue: Secondary | ICD-10-CM

## 2015-11-11 DIAGNOSIS — I10 Essential (primary) hypertension: Secondary | ICD-10-CM

## 2015-11-11 DIAGNOSIS — C7951 Secondary malignant neoplasm of bone: Secondary | ICD-10-CM

## 2015-11-11 DIAGNOSIS — C787 Secondary malignant neoplasm of liver and intrahepatic bile duct: Secondary | ICD-10-CM

## 2015-11-11 DIAGNOSIS — I638 Other cerebral infarction: Secondary | ICD-10-CM

## 2015-11-11 DIAGNOSIS — Z79899 Other long term (current) drug therapy: Secondary | ICD-10-CM

## 2015-11-11 DIAGNOSIS — F17210 Nicotine dependence, cigarettes, uncomplicated: Secondary | ICD-10-CM

## 2015-11-11 MED ORDER — CLOPIDOGREL BISULFATE 75 MG PO TABS
75.0000 mg | ORAL_TABLET | Freq: Every day | ORAL | Status: DC
  Administered 2015-11-11: 75 mg via ORAL
  Filled 2015-11-11: qty 1

## 2015-11-11 MED ORDER — ASPIRIN 81 MG PO CHEW: 81.0000 mg | CHEWABLE_TABLET | Freq: Every day | ORAL | Status: DC

## 2015-11-11 MED ORDER — MORPHINE SULFATE ER 30 MG PO TBCR: 30.0000 mg | EXTENDED_RELEASE_TABLET | Freq: Two times a day (BID) | ORAL | Status: DC

## 2015-11-11 MED ORDER — CLOPIDOGREL BISULFATE 75 MG PO TABS: 75.0000 mg | ORAL_TABLET | Freq: Every day | ORAL | Status: DC

## 2015-11-11 MED ORDER — ATORVASTATIN CALCIUM 80 MG PO TABS: 80.0000 mg | ORAL_TABLET | Freq: Every day | ORAL | Status: DC

## 2015-11-11 NOTE — Progress Notes (Addendum)
Clinical Social Worker was informed that patient will be medically ready to discharge to [facility]. Patient is in a agreement with plan. CSW called Doug- Admissions Coordinator to confirm that patient's bed is ready. Provided patient's room number 205A and number to call for report [phone] . Awaiting for MD To cosign FL2 and then CSW will fax discharge information faxed to [facility] via [place]. RN will call report and patient will discharge to [facility] via [facility].  Ernest Pine, MSW, [place] Social Work Department [phone]

## 2015-11-11 NOTE — Progress Notes (Signed)
Discharge information faxed to [facility] via [place]. RN notified.  Ernest Pine, MSW, [place] Social Work Department [phone]

## 2015-11-11 NOTE — Consult Note (Signed)
[facility] Consult Note  MRN : [id number]  Ryan Rush is a 55 y.o. (26-Dec-1960) male who presents with chief complaint of  Chief Complaint  Patient presents with  . Cerebrovascular Accident  .  History of Present Illness: I am asked to evaluate this patient by Dr. Tressia Miners for critical carotid stenosis in the face of a acute CVA. The patient is a 55 y.o. 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 Patient was diagnosed with his cancer 3 months ago. He has received 3 cycles of traditional palliative chemotherapy with good response. However he was experiencing left lower extremity neurological side effects with tingling and numbness and left foot drop. Plans were to start him on immunotherapy starting this week. Patient noticed that his whole left side was numb 2 days ago but given the symptoms she has been experiencing secondary to chemotherapy did not think it could be a CVA rather attributed it to his chemotherapy and did not seek immediate attention as noted above.. The numbness and weakness of his left leg has improved in the last day. Since the left arm weakness was not improving he presented to the emergency room. He is right-handed. He can feel the sensation of his left arm but needs help with moderate activity. Strength is 3/5 on admission. CT of the head did not show any acute abnormalities however, MRI demonstrated an acute CVA in the middle cerebral distribution. Over the past 2 days he has noted some improvement in his left arm strength. His right leg is back to baseline and does not appear to have any motor deficit it remains slightly numb. He denies any further episodes of weakness, amaurosis fugax or other symptoms since admission that could be attributed to  further cerebral insult.  Current Facility-Administered Medications  Medication Dose Route Frequency Provider Last Rate Last Dose  . 0.9 %  sodium chloride infusion   Intravenous Continuous Gladstone Lighter, MD 75 mL/hr at 11/10/15 [id number]    . acetaminophen (TYLENOL) tablet 650 mg  650 mg Oral Q6H PRN Gladstone Lighter, MD       Or  . acetaminophen (TYLENOL) suppository 650 mg  650 mg Rectal Q6H PRN Gladstone Lighter, MD      . amLODipine (NORVASC) tablet 5 mg  5 mg Oral Daily Gladstone Lighter, MD   5 mg at 11/11/15 [id number]  . aspirin chewable tablet 81 mg  81 mg Oral Daily Gladstone Lighter, MD   81 mg at 11/11/15 0833  . atorvastatin (LIPITOR) tablet 40 mg  40 mg Oral q1800 Gladstone Lighter, MD   40 mg at 11/10/15 1701  . clopidogrel (PLAVIX) tablet 75 mg  75 mg Oral Daily Gladstone Lighter, MD   75 mg at 11/11/15 1302  . enoxaparin (LOVENOX) injection 40 mg  40 mg Subcutaneous Q24H Gladstone Lighter, MD   40 mg at 11/10/15 2154  . famotidine (PEPCID) tablet 20 mg  20 mg Oral Daily Gladstone Lighter, MD   20 mg at 11/11/15 [id number]  . gabapentin (NEURONTIN) capsule 600 mg  600 mg Oral TID Gladstone Lighter, MD   600 mg at 11/11/15 [id number]  . loperamide (IMODIUM) capsule 2 mg  2 mg Oral PRN Gladstone Lighter, MD      . magic mouthwash w/lidocaine  5 mL Oral QID  PRN Gladstone Lighter, MD      . morphine (MS CONTIN) 12 hr tablet 30 mg  30 mg Oral Q12H Gladstone Lighter, MD   30 mg at 11/11/15 0833  . nicotine (NICODERM CQ - dosed in mg/24 hours) patch 21 mg  21 mg Transdermal Daily Gladstone Lighter, MD      . ondansetron (ZOFRAN) tablet 4 mg  4 mg Oral Q6H PRN Gladstone Lighter, MD       Or  . ondansetron (ZOFRAN) injection 4 mg  4 mg Intravenous Q6H PRN Gladstone Lighter, MD      . pantoprazole (PROTONIX) EC tablet 40 mg  40 mg Oral Daily Gladstone Lighter, MD   40 mg at 11/11/15 [id number]  . sodium chloride flush (NS) 0.9 % injection 3 mL  3 mL Intravenous Q12H Gladstone Lighter, MD   3 mL at 11/10/15  2155    Past Medical History  Diagnosis Date  . 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 Hypertension   . Tobacco use     Past Surgical History  Procedure Laterality Date  . Peripheral vascular catheterization N/A 08/18/2015    Procedure: Glori Luis Cath Insertion;  Surgeon: Katha Cabal, MD;  Location: [facility];  Service: Cardiovascular;  Laterality: N/A;    Social History Social History  Substance Use Topics  . Smoking status: Current Every Day Smoker -- 1.00 packs/day    Types: Cigarettes  . Smokeless tobacco: None  . Alcohol Use: No     Comment: occasional    Family History Family History  Problem Relation Age of Onset  . Cancer Mother     metastasized  No family history of bleeding clotting disorders, porphyria or autoimmune disease.  No Known Allergies   REVIEW OF SYSTEMS (Negative unless checked)  Constitutional: [x] Weight loss  [] Fever  [] Chills Cardiac: [] Chest pain   [] Chest pressure   [] Palpitations   [] Shortness of breath when laying flat   [] Shortness of breath at rest   [] Shortness of breath with exertion. Vascular:  [] Pain in legs with walking   [] Pain in legs at rest   [] Pain in legs when laying flat   [] Claudication   [] Pain in feet when walking  [] Pain in feet at rest  [] Pain in feet when laying flat   [] History of DVT   [] Phlebitis   [] Swelling in legs   [] Varicose veins   [] Non-healing ulcers Pulmonary:   [] Uses home oxygen   [] Productive cough   [] Hemoptysis   [] Wheeze  [] COPD   [] Asthma Neurologic:  [] Dizziness  [] Blackouts   [] Seizures   [] History of stroke   [] History of TIA  [] Aphasia   [] Temporary blindness   [] Dysphagia   [x] Weakness or numbness in arms   [x] Weakness or numbness in legs Musculoskeletal:  [] Arthritis   [] Joint swelling   [] Joint pain   [] Low back pain Hematologic:  [] Easy bruising  [] Easy bleeding   [] Hypercoagulable state   [] Anemic  [] Hepatitis Gastrointestinal:   [] Blood in stool   [] Vomiting blood  [] Gastroesophageal reflux/heartburn   [] Difficulty swallowing. Genitourinary:  [] Chronic kidney disease   [] Difficult urination  [] Frequent urination  [] Burning with urination   [] Blood in urine Skin:  [] Rashes   [] Ulcers   [] Wounds Psychological:  [] History of anxiety   []  History of major depression.  Physical Examination  Filed Vitals:   11/11/15 0021 11/11/15 0420 11/11/15 0700 11/11/15 1113  BP: 125/61 158/75  167/87 136/67  Pulse: 77 79 79 82  Temp: 98.4 F (36.9 C) 97.9 F (36.6 C) 97.8 F (36.6 C)   TempSrc: Oral Oral Oral   Resp: 18 18 16 20   Height:      Weight:      SpO2: 97% 95% 95% 97%   Body mass index is 20.69 kg/(m^2). Gen:  WD/WN, NAD Head: [place]/AT, No temporalis wasting. Prominent temp pulse not noted. Ear/Nose/Throat: Hearing grossly intact, nares w/o erythema or drainage, oropharynx w/o Erythema/Exudate Eyes: PERRLA, EOMI.  Neck: Supple, no nuchal rigidity.  No bruit or JVD.  Pulmonary:  Good air movement, clear to auscultation bilaterally.  Cardiac: RRR, normal S1, S2, no Murmurs, rubs or gallops. Vascular: Bilateral carotid bruits auscultated Vessel Right Left  Radial Palpable Palpable  Ulnar Palpable Palpable  Brachial Palpable Palpable  Carotid Palpable, without bruit Palpable, without bruit  Aorta Not palpable N/A  Femoral Palpable Palpable  Popliteal Palpable Palpable  PT Palpable Palpable  DP Palpable Palpable   Gastrointestinal: soft, non-tender/non-distended. No guarding/reflex. No masses, surgical incisions, or scars. Musculoskeletal: M/S 5/5 on the right side and 3 out of 5 on the left arm the left leg is 5 out of 5.  Extremities without ischemic changes.  No deformity or atrophy. No edema. Neurologic: CN 2-12 intact. Pain and light touch intact in extremities.  Symmetrical.  Speech is fluent. Motor exam as listed above. Psychiatric: Judgment intact, Mood & affect appropriate for pt's clinical  situation. Dermatologic: No rashes or ulcers noted.  No cellulitis or open wounds. Lymph : No Cervical, Axillary, or Inguinal lymphadenopathy.   CBC Lab Results  Component Value Date   WBC 5.1 11/10/2015   HGB 12.8* 11/10/2015   HCT 37.8* 11/10/2015   MCV 93.0 11/10/2015   PLT 222 11/10/2015    BMET    Component Value Date/Time   NA 138 11/10/2015 0513   K 3.6 11/10/2015 0513   CL 108 11/10/2015 0513   CO2 22 11/10/2015 0513   GLUCOSE 86 11/10/2015 0513   BUN 16 11/10/2015 0513   CREATININE 0.72 11/10/2015 0513   CALCIUM 9.0 11/10/2015 0513   GFRNONAA >60 11/10/2015 0513   GFRAA >60 11/10/2015 0513   Estimated Creatinine Clearance: 92.8 mL/min (by C-G formula based on Cr of 0.72).  COAG Lab Results  Component Value Date   INR 1.13 11/09/2015    Radiology Ct Angio Head W/cm &/or Wo Cm  11/10/2015  CLINICAL DATA:  55 year old male with acute right MCA infarct discovered yesterday for evaluation of acute left side weakness. Personal history of metastatic squamous cell carcinoma of the tongue. Initial encounter. EXAM: CT ANGIOGRAPHY HEAD AND NECK TECHNIQUE: Multidetector CT imaging of the head and neck was performed using the standard protocol during bolus administration of intravenous contrast. Multiplanar CT image reconstructions and MIPs were obtained to evaluate the vascular anatomy. Carotid stenosis measurements (when applicable) are obtained utilizing NASCET criteria, using the distal internal carotid diameter as the denominator. CONTRAST:  75 mL Isovue 370 COMPARISON:  Brain MRI 11/09/2015. Head CT without contrast 11/09/2015. Neck CT 08/03/2015. FINDINGS: CT HEAD Brain: Hypodensity corresponding to the right MCA area of restricted diffusion by MRI yesterday. No associated hemorrhage or mass effect. Otherwise stable noncontrast CT appearance of the brain since 11/09/2015. Calvarium and skull base: No acute or suspicious osseous lesion. Paranasal sinuses: Progressed  opacification of the right sphenoid sinus. Mild maxillary sinus mucosal thickening. Mastoids are clear. Orbits: Negative. Visualized scalp soft tissues are within normal limits.  CTA NECK Skeleton: Carious dentition of the maxilla posteriorly on the right. Moderate to severe lower cervical spine degenerative changes. No acute or suspicious osseous lesion identified. Other neck: Right chest IJ approach porta cath in place. No superior mediastinal or axillary lymphadenopathy. Negative lung apices. Regressed right oropharyngeal soft tissue mass. Asymmetric retropharyngeal node described yesterday by MRI visible on series 6, image 197. Postradiation changes in the neck including mild diffuse pharyngeal mucosal space thickening. Regressed but not resolved right side cervical lymphadenopathy. The malignant left side level 2 lymphadenopathy appears more unchanged compared to 08/03/2015. Negative thyroid. Negative larynx. Parotid glands, sublingual space, and submandibular glands are within normal limits. Aortic arch: 3 vessel arch configuration with moderate soft and calcified atherosclerotic plaque. Right carotid system: No brachiocephalic artery or right CCA origin stenosis despite plaque. Soft and calcified plaque in the right CCA anteriorly without stenosis. At the right carotid bifurcation there is complex soft and calcified plaque affecting the right ICA origin and bulb. Subsequent radiographic string sign stenosis (series 6, image 187) occurs just distal to the bulb (see also series 11, image 53) and affects a segment of about 5 mm. Despite this the right ICA remains patent and is otherwise negative to the skullbase. Left carotid system: No left CCA origin stenosis despite soft plaque. Soft and calcified plaque in the medial left CCA without stenosis. Mostly calcified plaque at the left carotid bifurcation affecting the left ICA origin and bulb with less than 50 % stenosis with respect to the distal vessel. Vertebral  arteries: No proximal right subclavian artery stenosis despite calcified plaque. Calcified plaque at the right vertebral artery origin with moderate to severe stenosis (series 9, image 108). Tortuous right V2 segment, but otherwise negative right vertebral artery to the skullbase. No proximal left subclavian artery stenosis despite soft and calcified plaque. Very severe (string sign) stenosis at the left vertebral artery origin (series 9, image 111). Left vertebral artery otherwise negative to the skullbase. CTA HEAD Posterior circulation: The distal right vertebral artery is mildly dominant. Both distal vertebral arteries decreased in size beyond the PICA origins which remain normal. It appears then that the smaller distal left vertebral artery terminates at the left AICA. There is mild stenosis at the vertebrobasilar junction. The basilar artery is diminutive and irregular but remains patent. The SCA origins are patent. There are fetal type bilateral PCA origins. The bilateral PCA branches are moderately irregular diffusely but remain patent. Anterior circulation: Right ICA siphon is patent with moderate calcified atherosclerosis. No hemodynamically significant right siphon stenosis. Right posterior communicating artery origin is normal. Right ICA terminus is patent. Right MCA origin, M1 segment, and right MCA bifurcation appear normal. There is mild irregularity in the proximal right MCA M2 branches. There is no M2 branch occlusion. There is more moderate right M3 branch irregularity as seen on series 12, image 8. Left ICA siphon is patent with moderate to severe soft and calcified plaque in the cavernous segment resulting in severe stenosis as seen on series 11, image 67. Despite this the left ICA terminus remains patent. Left posterior communicating artery origin is normal. Left MCA in both ACA origins are within normal limits. Anterior communicating artery and bilateral ACA branches are within normal limits.  Left MCA M1 segment is normal proximally. There is mild to moderate irregularity and stenosis at the distal left M1 and at the left MCA bifurcation, best seen on series 8, image 53. This most affects the dominant posterior left M2 branch which remains  patent. Multifocal high-grade stenoses are demonstrated in the posterior left MCA division as seen on series 12, image 24. Venous sinuses: Patent. Anatomic variants: Left vertebral artery terminates in the AICA while the right supplies the basilar. Delayed phase: No abnormal enhancement of the brain. IMPRESSION: 1. Negative for emergent large vessel occlusion associated with the acute right MCA infarct, but positive for High-grade cervical Right ICA stenosis (RADIOGRAPHIC STRING SIGN) at the level of the distal bulb. Superimposed mild to moderate irregularity of the right MCA M2 and M3 branches. 2. Severe for age generalized atherosclerosis in the head and neck. Other moderate to severe arterial stenoses: - left vertebral artery origin (very severe), - right vertebral artery origin (moderate to severe) - left ICA siphon (severe), - left MCA bifurcation and posterior left M2 branch (severe) - vertebrobasilar junction and basilar artery (moderate), - bilateral PCA branches (moderate). 3. Expected CT appearance of the small acute right MCA infarct, no associated hemorrhage or mass effect. 4. Regressed but not resolved malignant findings in the right oropharynx and bilateral cervical neck nodes. Electronically Signed   By: Genevie Ann M.D.   On: 11/10/2015 16:42   Ct Head Wo Contrast  11/09/2015  CLINICAL DATA:  Left-sided numbness, weakness since Saturday. EXAM: CT HEAD WITHOUT CONTRAST TECHNIQUE: Contiguous axial images were obtained from the base of the skull through the vertex without intravenous contrast. COMPARISON:  None. FINDINGS: No acute intracranial abnormality. Specifically, no hemorrhage, hydrocephalus, mass lesion, acute infarction, or significant intracranial  injury. No acute calvarial abnormality. Visualized paranasal sinuses and mastoids clear. Orbital soft tissues unremarkable. IMPRESSION: No acute intracranial abnormality. Electronically Signed   By: Rolm Baptise M.D.   On: 11/09/2015 12:20   Ct Angio Neck W/cm &/or Wo/cm  11/10/2015  CLINICAL DATA:  55 year old male with acute right MCA infarct discovered yesterday for evaluation of acute left side weakness. Personal history of metastatic squamous cell carcinoma of the tongue. Initial encounter. EXAM: CT ANGIOGRAPHY HEAD AND NECK TECHNIQUE: Multidetector CT imaging of the head and neck was performed using the standard protocol during bolus administration of intravenous contrast. Multiplanar CT image reconstructions and MIPs were obtained to evaluate the vascular anatomy. Carotid stenosis measurements (when applicable) are obtained utilizing NASCET criteria, using the distal internal carotid diameter as the denominator. CONTRAST:  75 mL Isovue 370 COMPARISON:  Brain MRI 11/09/2015. Head CT without contrast 11/09/2015. Neck CT 08/03/2015. FINDINGS: CT HEAD Brain: Hypodensity corresponding to the right MCA area of restricted diffusion by MRI yesterday. No associated hemorrhage or mass effect. Otherwise stable noncontrast CT appearance of the brain since 11/09/2015. Calvarium and skull base: No acute or suspicious osseous lesion. Paranasal sinuses: Progressed opacification of the right sphenoid sinus. Mild maxillary sinus mucosal thickening. Mastoids are clear. Orbits: Negative. Visualized scalp soft tissues are within normal limits. CTA NECK Skeleton: Carious dentition of the maxilla posteriorly on the right. Moderate to severe lower cervical spine degenerative changes. No acute or suspicious osseous lesion identified. Other neck: Right chest IJ approach porta cath in place. No superior mediastinal or axillary lymphadenopathy. Negative lung apices. Regressed right oropharyngeal soft tissue mass. Asymmetric  retropharyngeal node described yesterday by MRI visible on series 6, image 197. Postradiation changes in the neck including mild diffuse pharyngeal mucosal space thickening. Regressed but not resolved right side cervical lymphadenopathy. The malignant left side level 2 lymphadenopathy appears more unchanged compared to 08/03/2015. Negative thyroid. Negative larynx. Parotid glands, sublingual space, and submandibular glands are within normal limits. Aortic arch: 3 vessel arch  configuration with moderate soft and calcified atherosclerotic plaque. Right carotid system: No brachiocephalic artery or right CCA origin stenosis despite plaque. Soft and calcified plaque in the right CCA anteriorly without stenosis. At the right carotid bifurcation there is complex soft and calcified plaque affecting the right ICA origin and bulb. Subsequent radiographic string sign stenosis (series 6, image 187) occurs just distal to the bulb (see also series 11, image 53) and affects a segment of about 5 mm. Despite this the right ICA remains patent and is otherwise negative to the skullbase. Left carotid system: No left CCA origin stenosis despite soft plaque. Soft and calcified plaque in the medial left CCA without stenosis. Mostly calcified plaque at the left carotid bifurcation affecting the left ICA origin and bulb with less than 50 % stenosis with respect to the distal vessel. Vertebral arteries: No proximal right subclavian artery stenosis despite calcified plaque. Calcified plaque at the right vertebral artery origin with moderate to severe stenosis (series 9, image 108). Tortuous right V2 segment, but otherwise negative right vertebral artery to the skullbase. No proximal left subclavian artery stenosis despite soft and calcified plaque. Very severe (string sign) stenosis at the left vertebral artery origin (series 9, image 111). Left vertebral artery otherwise negative to the skullbase. CTA HEAD Posterior circulation: The distal  right vertebral artery is mildly dominant. Both distal vertebral arteries decreased in size beyond the PICA origins which remain normal. It appears then that the smaller distal left vertebral artery terminates at the left AICA. There is mild stenosis at the vertebrobasilar junction. The basilar artery is diminutive and irregular but remains patent. The SCA origins are patent. There are fetal type bilateral PCA origins. The bilateral PCA branches are moderately irregular diffusely but remain patent. Anterior circulation: Right ICA siphon is patent with moderate calcified atherosclerosis. No hemodynamically significant right siphon stenosis. Right posterior communicating artery origin is normal. Right ICA terminus is patent. Right MCA origin, M1 segment, and right MCA bifurcation appear normal. There is mild irregularity in the proximal right MCA M2 branches. There is no M2 branch occlusion. There is more moderate right M3 branch irregularity as seen on series 12, image 8. Left ICA siphon is patent with moderate to severe soft and calcified plaque in the cavernous segment resulting in severe stenosis as seen on series 11, image 67. Despite this the left ICA terminus remains patent. Left posterior communicating artery origin is normal. Left MCA in both ACA origins are within normal limits. Anterior communicating artery and bilateral ACA branches are within normal limits. Left MCA M1 segment is normal proximally. There is mild to moderate irregularity and stenosis at the distal left M1 and at the left MCA bifurcation, best seen on series 8, image 53. This most affects the dominant posterior left M2 branch which remains patent. Multifocal high-grade stenoses are demonstrated in the posterior left MCA division as seen on series 12, image 24. Venous sinuses: Patent. Anatomic variants: Left vertebral artery terminates in the AICA while the right supplies the basilar. Delayed phase: No abnormal enhancement of the brain.  IMPRESSION: 1. Negative for emergent large vessel occlusion associated with the acute right MCA infarct, but positive for High-grade cervical Right ICA stenosis (RADIOGRAPHIC STRING SIGN) at the level of the distal bulb. Superimposed mild to moderate irregularity of the right MCA M2 and M3 branches. 2. Severe for age generalized atherosclerosis in the head and neck. Other moderate to severe arterial stenoses: - left vertebral artery origin (very severe), - right vertebral  artery origin (moderate to severe) - left ICA siphon (severe), - left MCA bifurcation and posterior left M2 branch (severe) - vertebrobasilar junction and basilar artery (moderate), - bilateral PCA branches (moderate). 3. Expected CT appearance of the small acute right MCA infarct, no associated hemorrhage or mass effect. 4. Regressed but not resolved malignant findings in the right oropharynx and bilateral cervical neck nodes. Electronically Signed   By: Genevie Ann M.D.   On: 11/10/2015 16:42   Mr Jeri Cos [id number] Contrast  11/09/2015  CLINICAL DATA:  Metastatic squamous cell carcinoma of the tongue. Response to therapy based on recent PET scan from May 2017. Acute onset of LEFT-sided weakness. EXAM: MRI HEAD WITHOUT AND WITH CONTRAST TECHNIQUE: Multiplanar, multiecho pulse sequences of the brain and surrounding structures were obtained without and with intravenous contrast. CONTRAST:  93mL MULTIHANCE GADOBENATE DIMEGLUMINE 529 MG/ML IV SOLN COMPARISON:  CT head earlier today. FINDINGS: Restricted diffusion involving the RIGHT frontal opercular cortex, RIGHT posterior frontal cortex, and adjacent subcortical white matter. No similar abnormalities on the LEFT, elsewhere in the RIGHT hemisphere, or in the posterior fossa. No hemorrhage, mass lesion, hydrocephalus, or extra-axial fluid. Global atrophy. Mild to moderate T2 and FLAIR hyperintensities throughout the white matter, likely chronic microvascular ischemic change. Punctate foci of susceptibility in  the basal ganglia bilaterally associated with brain substance loss likely representing small hemorrhagic lacunar infarcts of a chronic nature. Nonhemorrhagic lacunar infarcts also noted in the RIGHT callosal periventricular white matter, LEFT caudate, brainstem, and cerebellum. Flow voids are maintained.  No proximal large vessel occlusion. No midline abnormality. Postcontrast, no abnormal enhancement. No evidence for intracranial metastatic disease. Major dural venous sinuses are patent. No acute sinus or mastoid disease.  Negative orbits. Enhancing, enlarged RIGHT retropharyngeal node of [place] 13 x 14 mm cross-section, image 12 series 12, likely metastatic disease given its ipsilateral location. Incompletely evaluated, but suspected asymmetric enhancing oropharyngeal tumor as seen on the most inferior postcontrast slice series 12, image 1. Ipsilateral RIGHT level 2 nodes, incompletely evaluated on diffusion, likely residual disease. IMPRESSION: Acute nonhemorrhagic RIGHT frontal infarct, RIGHT MCA M3 branch distribution. No evidence for large vessel occlusion. Premature for age atrophy with widespread areas of old infarction, some of which display trace amount of chronic hemorrhage. While recent PET scan demonstrated positive response to therapy, there are areas of concern with regard to residual metastatic oropharyngeal squamous cell carcinoma, most notably a greater than 1 cm RIGHT retropharyngeal node. No intracranial metastatic disease is present however. Electronically Signed   By: Staci Righter M.D.   On: 11/09/2015 16:41   US Carotid Bilateral  11/09/2015  CLINICAL DATA:  55 year old male with left-sided weakness EXAM: BILATERAL CAROTID DUPLEX ULTRASOUND TECHNIQUE: Pearline Cables scale imaging, color Doppler and duplex ultrasound were performed of bilateral carotid and vertebral arteries in the neck. COMPARISON:  Head CT obtained earlier today FINDINGS: Criteria: Quantification of carotid stenosis is based on  velocity parameters that correlate the residual internal carotid diameter with NASCET-based stenosis levels, using the diameter of the distal internal carotid lumen as the denominator for stenosis measurement. The following velocity measurements were obtained: RIGHT ICA:  193/35 cm/sec CCA:  [id number] cm/sec SYSTOLIC ICA/CCA RATIO:  1.3 DIASTOLIC ICA/CCA RATIO:  2.6 ECA:  95 cm/sec LEFT ICA:  115/23 cm/sec CCA:  [id number] cm/sec SYSTOLIC ICA/CCA RATIO:  1.2 DIASTOLIC ICA/CCA RATIO:  2.3 ECA:  424 cm/sec RIGHT CAROTID ARTERY: Heterogeneous atherosclerotic plaque in the distal common carotid artery extending into the proximal internal carotid artery. By peak  systolic velocity criteria the stenosis falls in the 50- 69% range. RIGHT VERTEBRAL ARTERY:  Patent with normal antegrade flow. LEFT CAROTID ARTERY: Heterogeneous atherosclerotic plaque beginning in the mid common carotid artery. Irregular partially calcified plaque at the bifurcation extends into the internal and external carotid arteries. There is significant elevation of the peak systolic velocity in the external carotid artery consistent with a greater than 70% diameter stenosis. By peak systolic velocity criteria the internal carotid stenosis remains less than 50%. LEFT VERTEBRAL ARTERY:  Patent with normal antegrade flow. Other: Bulky bilateral cervical chain adenopathy. Patient has known head and neck cancer with recent PET-CT imaging. IMPRESSION: 1. Moderate (50-69%) stenosis proximal right internal carotid artery secondary to heterogenous atherosclerotic plaque. 2. Mild (1-49%) stenosis proximal left internal carotid artery secondary to heterogenous atherosclerotic plaque. 3. Vertebral arteries are patent with normal antegrade flow. 4. Incidental note is made of a hemodynamically significant stenosis in the left external carotid artery. Signed, Criselda Peaches, MD [facility] Electronically Signed    By: Jacqulynn Cadet M.D.   On: 11/09/2015 15:56   Nm Pet Image Restag (ps) Skull Base To Thigh  10/21/2015  CLINICAL DATA:  Subsequent treatment strategy for squamous cell carcinoma of head neck. EXAM: NUCLEAR MEDICINE PET SKULL BASE TO THIGH TECHNIQUE: 12.5 mCi F-18 FDG was injected intravenously. Full-ring PET imaging was performed from the skull base to thigh after the radiotracer. CT data was obtained and used for attenuation correction and anatomic localization. FASTING BLOOD GLUCOSE:  Value: 91 mg/dl COMPARISON:  08/18/2015. FINDINGS: NECK Right tongue base and tonsillar hypermetabolism have resolved. Persistent but improved bilateral cervical nodal hypermetabolism. For example, a left sided level 2 jugular chain node measures 1.5 cm and a S.U.V. max of 6.0 on image 41/ series 3. Compare 1.9 cm and a S.U.V. max of 10.7 on the prior. CHEST No areas of abnormal hypermetabolism. ABDOMEN/PELVIS Resolution of hepatic hypermetabolism. gastrohepatic ligament/peripancreatic node measures 1.4 cm and a S.U.V. max of 3.6 on image 158/series 3. Compare 1.5 cm and a S.U.V. max of 4.5 on the prior. SKELETON No abnormal marrow activity. CT IMAGES PERFORMED FOR ATTENUATION CORRECTION Cerebral and more so cerebellar atrophy with resultant prominence of the extra-axial spaces. Advanced for age carotid atherosclerosis. Right Port-A-Cath which terminates at the high right atrium. Normal heart size, without pericardial effusion. Multivessel coronary artery atherosclerosis. Right lower lobe ill-defined clustered nodules, including on image 121/series 3, new. Punctate right renal collecting system calculus. Abdominal aortic atherosclerosis. IMPRESSION: 1. Response to therapy. Resolution of right tongue base/palatine tonsil primary hypermetabolism. 2. Improvement in bilateral cervical and isolated upper abdominal nodal metastasis. 3. Resolved hepatic hypermetabolism. 4. No new sites of disease. 5.  Atherosclerosis, including  within the coronary arteries. Electronically Signed   By: Abigail Miyamoto M.D.   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 Lesion extends to the level of C1 very high and not accessible by traditional surgical technique. Given these findings and his symptomatic nature as well as his high risk secondary to his cancer therapy I have recommended he undergo carotid stenting in approximately 4 weeks. This should allow appropriate time for the damaged brain to heal while minimizing his risk for further cerebral vascular  events. The risks and benefits as well as the indications for intervention were reviewed in detail. Extensive comparison between open and interventional techniques was also reviewed. Also discussed was the potential for stopping antiplatelet therapy if his chemotherapy is resumed and his platelet count should drop. These issues were all reviewed and compared to the risk of open surgery which would be extremely high given the location of his lesion as well as his increased risk for anesthesia of in his cancer of the tongue and problems regarding intubation this would create. After long discussion all questions of been answered the patient agrees with the plan and wishes to proceed. 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 Immunotherapy may offer some significant advantages as suppression of his platelet count will not be as big an issue on this course of therapy.   Schnier, Dolores Lory, MD  11/11/2015 1:38 PM

## 2015-11-11 NOTE — Progress Notes (Signed)
IV d/c intact  Tele d/c and called central tele. EMS notified of transfer

## 2015-11-11 NOTE — Progress Notes (Signed)
CSW was contacted by EMS stating the EMS form was wrong. CSW corrected EMS form. Per EMS patient does not have any insurance and [facility] will be billed for the transport. CSW contacted [place]- Development worker, international aid with [facility] left voicemail. CSW contacted [employer]. Per Business Office patient is on a LOG from [employer]. She stated the hospital is paying for patient's stay and the EMS bill will be forwarded to the hospital. Reported they did not provide any transportation back to [facility].   Ernest Pine, MSW, [place] [facility] Department [phone]

## 2015-11-11 NOTE — Discharge Summary (Signed)
[facility] [place] NAME: Ryan Rush    MR#:  [id number]  DATE OF BIRTH:  [birth year]  DATE OF ADMISSION:  11/09/2015 ADMITTING PHYSICIAN: Gladstone Lighter, MD  DATE OF DISCHARGE: 11/11/2015  PRIMARY CARE PHYSICIAN: Kirk Ruths., MD    ADMISSION DIAGNOSIS:  Cancer ([place]) [C80.1] Left-sided weakness [M62.89] Cerebral infarction due to unspecified mechanism [I63.9]  DISCHARGE DIAGNOSIS:  Active Problems:   CVA (cerebral infarction)   SECONDARY DIAGNOSIS:   Past Medical History  Diagnosis Date  . 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 Hypertension   . Tobacco use     HOSPITAL COURSE:   Ryan Rush is a 55 y.o. 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- stable neuro checks, persistent left arm weakness - On aspirin, Plavix and statin, LDL`114 - MRI with and without contrast Showing right frontal infarct. No metastases noted in brain. - carotid dopplers with moderate stenosis on the right  - CTA of head and neck with significant high-grade occlusions noted in right ICA (string sign), severe age-related atherosclerosis, severe bilateral vertebral artery origin stenosis, left MCA bifurcation and PCA branches stenosis. -Due to above-mentioned significant vascular disease, vascular consult requested. Outpatient follow-up recommended in 3-4 weeks. -We'll change statin to high intensity, high-dose statin. Also aspirin and Plavix for now - ECHO done normal ejection fraction. No source for CVA identified -Neurology consulted - Physical therapy, occupational therapy and speech therapy consults -recommended ongoing therapy at his current living  situation which is rehabilitation facility  #2 hypertension- continue norvasc  #3 Neuropathy- worsened with chemo- on neurontin, in both lower extremities- worse on the left side No new changes  #4 Bone mets with chronic pain- continue MS contin And Pain medications  #5 Metastatic squamous cell carcinoma of tongue base- s/p 3 cycles of chemo with improvement on PET scan, patient having neuro side effects - so plans to start immunotherapy next week, however will be put on hold due to recent stroke.   Patient will be discharged today.  DISCHARGE CONDITIONS:   Guarded  CONSULTS OBTAINED:  Treatment Team:  Alexis Goodell, MD Catarina Hartshorn, MD Lloyd Huger, MD Katha Cabal, MD  DRUG ALLERGIES:  No Known Allergies  DISCHARGE MEDICATIONS:   Current Discharge Medication List    START taking these medications   Details  aspirin 81 MG chewable tablet Chew 1 tablet (81 mg total) by mouth daily. Qty: 30 tablet, Refills: 2    atorvastatin (LIPITOR) 80 MG tablet Take 1 tablet (80 mg total) by mouth daily at 6 PM. Qty: 30 tablet, Refills: 2    clopidogrel (PLAVIX) 75 MG tablet Take 1 tablet (75 mg total) by mouth daily. Qty: 30 tablet, Refills: 2      CONTINUE these medications which have CHANGED   Details  morphine (MS CONTIN) 30 MG 12 hr tablet Take 1 tablet (30 mg total) by mouth every 12 (twelve) hours. Qty: 10 tablet, Refills: 0      CONTINUE these medications which have NOT CHANGED   Details  amLODipine (NORVASC) 5 MG tablet Take 1 tablet (5 mg total) by mouth daily. Qty: 60 tablet, Refills:  1   Associated Diagnoses: Tonsil cancer ([place])    diphenoxylate-atropine (LOMOTIL) 2.5-0.025 MG tablet Take 1 tablet by mouth 4 (four) times daily as needed for diarrhea or loose stools. 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 Apply to port a cath site 1 hours before treatments Qty: 30 g, Refills: 3   Associated Diagnoses: Squamous cell carcinoma of right tonsil ([place]); Portacath in place    loperamide (IMODIUM) 2 MG capsule Take 1 capsule (2 mg total) by mouth as needed for diarrhea or loose stools. 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 Qty: 250 mL, Refills: 3   Associated Diagnoses: Mucositis; Squamous cell carcinoma of right tonsil ([place]); Hypokalemia    nystatin (MYCOSTATIN) 100000 UNIT/ML suspension Take 5 mLs (500,000 Units total) by mouth 4 (four) times daily. Qty: 60 mL, Refills: 0    ondansetron (ZOFRAN) 8 MG tablet Take 1 tablet (8 mg total) by mouth every 8 (eight) hours as needed for nausea or vomiting. 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 Qty: 60 tablet, Refills: 3   Associated Diagnoses: Heartburn    prochlorperazine (COMPAZINE) 10 MG tablet Take 1 tablet (10 mg total) by mouth every 6 (six) hours as needed for nausea or vomiting. 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   Associated Diagnoses: Mucositis; Squamous cell carcinoma of right tonsil ([place]); Hypokalemia      STOP taking these medications     loratadine (CLARITIN) 10 MG tablet      potassium chloride SA (K-DUR,KLOR-CON) 20 MEQ tablet           DISCHARGE INSTRUCTIONS:   1. PCP follow-up in 2 weeks 2. Oncology follow-up in 1 week 3. Vascular follow-up in 2-3 weeks 4. Smoking cessation recommended  If you experience worsening of your admission symptoms, develop shortness of breath, life threatening emergency, suicidal or homicidal thoughts you must seek medical attention immediately by calling 911 or calling your MD immediately  if symptoms less severe.  You Must read complete instructions/literature along with all the possible adverse reactions/side effects for all the Medicines you take and that have been prescribed to you. Take any new Medicines after you have completely understood and accept all the possible adverse reactions/side effects.   Please note  You were cared for by a hospitalist during your hospital stay. If you have any questions about your discharge medications or the care you received while you were in the hospital after you are discharged, you can call the unit and asked to speak with the hospitalist on call if the hospitalist that took care of you is not available. Once you are discharged, your primary care physician will handle any  further medical issues. Please note that NO REFILLS for any discharge medications will be authorized once you are discharged, as it is imperative that you return to your primary care physician (or establish a relationship with a primary care physician if you do not have one) for your aftercare needs so that they can reassess your need for medications and monitor your lab values.    Today   CHIEF COMPLAINT:   Chief Complaint  Patient presents with  . Cerebrovascular Accident    VITAL SIGNS:  Blood pressure 136/67, pulse 82, temperature 97.8 F (36.6 C), temperature source Oral, resp. rate 20, height 5\' 9"  (1.753 m), weight 63.594 kg (140 lb 3.2 oz), SpO2 97 %.  I/O:   Intake/Output Summary (Last 24 hours) at 11/11/15 1210 Last data filed at 11/11/15 0422  Gross per 24  hour  Intake   1620 ml  Output      0 ml  Net   1620 ml    PHYSICAL EXAMINATION:   Physical Exam  GENERAL: 55 y.o.-year-old patient lying in the bed with no acute distress.  EYES: Pupils equal, round, reactive to light and accommodation. No scleral icterus. Extraocular muscles intact.  HEENT: Head atraumatic, normocephalic. Oropharynx and nasopharynx clear. Mild erythema of posterior pharyngeal wall. No facial droop.  NECK: Supple, no jugular venous distention. No thyroid enlargement, no tenderness.  LUNGS: Normal breath sounds bilaterally, no wheezing, rales,rhonchi or crepitation. No use of accessory muscles of respiration.  CARDIOVASCULAR: S1, S2 normal. No murmurs, rubs, or gallops.  ABDOMEN: Soft, nontender, nondistended. Bowel sounds present. No organomegaly or mass.  EXTREMITIES: No pedal edema, cyanosis, or clubbing.  NEUROLOGIC: Cranial nerves II through XII are intact. Muscle strength 5/5 in all extremities except 3/5 strength of left arm. Sensation intact. Gait not checked.  PSYCHIATRIC: The patient is alert and oriented x 3.  SKIN: No obvious rash, lesion, or ulcer.   DATA REVIEW:   CBC  Recent Labs Lab 11/10/15 0513  WBC 5.1  HGB 12.8*  HCT 37.8*  PLT 222    Chemistries   Recent Labs Lab 11/09/15 0955 11/10/15 0513  NA 135 138  K 4.1 3.6  CL 101 108  CO2 25 22  GLUCOSE 94 86  BUN 16 16  CREATININE 0.78 0.72  CALCIUM 9.7 9.0  AST 25  --   ALT 14*  --   ALKPHOS 91  --   BILITOT 0.6  --     Cardiac Enzymes  Recent Labs Lab 11/09/15 0955  TROPONINI <0.03    Microbiology Results  Results for orders placed or performed during the hospital encounter of 11/09/15  MRSA PCR Screening     Status: None   Collection Time: 11/10/15  4:49 AM  Result Value Ref Range Status   MRSA by PCR NEGATIVE NEGATIVE Final    Comment:        The GeneXpert MRSA Assay (FDA approved for NASAL specimens only), is one component of a comprehensive MRSA  colonization surveillance program. It is not intended to diagnose MRSA infection nor to guide or monitor treatment for MRSA infections.     RADIOLOGY:  Ct Angio Head W/cm &/or Wo Cm  11/10/2015  CLINICAL DATA:  55 year old male with acute right MCA infarct discovered yesterday for evaluation of acute left side weakness. Personal history of metastatic squamous cell carcinoma of the tongue. Initial encounter. EXAM: CT ANGIOGRAPHY HEAD AND NECK TECHNIQUE: Multidetector CT imaging of the head and neck was performed using the standard  protocol during bolus administration of intravenous contrast. Multiplanar CT image reconstructions and MIPs were obtained to evaluate the vascular anatomy. Carotid stenosis measurements (when applicable) are obtained utilizing NASCET criteria, using the distal internal carotid diameter as the denominator. CONTRAST:  75 mL Isovue 370 COMPARISON:  Brain MRI 11/09/2015. Head CT without contrast 11/09/2015. Neck CT 08/03/2015. FINDINGS: CT HEAD Brain: Hypodensity corresponding to the right MCA area of restricted diffusion by MRI yesterday. No associated hemorrhage or mass effect. Otherwise stable noncontrast CT appearance of the brain since 11/09/2015. Calvarium and skull base: No acute or suspicious osseous lesion. Paranasal sinuses: Progressed opacification of the right sphenoid sinus. Mild maxillary sinus mucosal thickening. Mastoids are clear. Orbits: Negative. Visualized scalp soft tissues are within normal limits. CTA NECK Skeleton: Carious dentition of the maxilla posteriorly on the right. Moderate to severe lower cervical spine degenerative changes. No acute or suspicious osseous lesion identified. Other neck: Right chest IJ approach porta cath in place. No superior mediastinal or axillary lymphadenopathy. Negative lung apices. Regressed right oropharyngeal soft tissue mass. Asymmetric retropharyngeal node described yesterday by MRI visible on series 6, image 197.  Postradiation changes in the neck including mild diffuse pharyngeal mucosal space thickening. Regressed but not resolved right side cervical lymphadenopathy. The malignant left side level 2 lymphadenopathy appears more unchanged compared to 08/03/2015. Negative thyroid. Negative larynx. Parotid glands, sublingual space, and submandibular glands are within normal limits. Aortic arch: 3 vessel arch configuration with moderate soft and calcified atherosclerotic plaque. Right carotid system: No brachiocephalic artery or right CCA origin stenosis despite plaque. Soft and calcified plaque in the right CCA anteriorly without stenosis. At the right carotid bifurcation there is complex soft and calcified plaque affecting the right ICA origin and bulb. Subsequent radiographic string sign stenosis (series 6, image 187) occurs just distal to the bulb (see also series 11, image 53) and affects a segment of about 5 mm. Despite this the right ICA remains patent and is otherwise negative to the skullbase. Left carotid system: No left CCA origin stenosis despite soft plaque. Soft and calcified plaque in the medial left CCA without stenosis. Mostly calcified plaque at the left carotid bifurcation affecting the left ICA origin and bulb with less than 50 % stenosis with respect to the distal vessel. Vertebral arteries: No proximal right subclavian artery stenosis despite calcified plaque. Calcified plaque at the right vertebral artery origin with moderate to severe stenosis (series 9, image 108). Tortuous right V2 segment, but otherwise negative right vertebral artery to the skullbase. No proximal left subclavian artery stenosis despite soft and calcified plaque. Very severe (string sign) stenosis at the left vertebral artery origin (series 9, image 111). Left vertebral artery otherwise negative to the skullbase. CTA HEAD Posterior circulation: The distal right vertebral artery is mildly dominant. Both distal vertebral arteries  decreased in size beyond the PICA origins which remain normal. It appears then that the smaller distal left vertebral artery terminates at the left AICA. There is mild stenosis at the vertebrobasilar junction. The basilar artery is diminutive and irregular but remains patent. The SCA origins are patent. There are fetal type bilateral PCA origins. The bilateral PCA branches are moderately irregular diffusely but remain patent. Anterior circulation: Right ICA siphon is patent with moderate calcified atherosclerosis. No hemodynamically significant right siphon stenosis. Right posterior communicating artery origin is normal. Right ICA terminus is patent. Right MCA origin, M1 segment, and right MCA bifurcation appear normal. There is mild irregularity in the proximal right MCA M2 branches. There  is no M2 branch occlusion. There is more moderate right M3 branch irregularity as seen on series 12, image 8. Left ICA siphon is patent with moderate to severe soft and calcified plaque in the cavernous segment resulting in severe stenosis as seen on series 11, image 67. Despite this the left ICA terminus remains patent. Left posterior communicating artery origin is normal. Left MCA in both ACA origins are within normal limits. Anterior communicating artery and bilateral ACA branches are within normal limits. Left MCA M1 segment is normal proximally. There is mild to moderate irregularity and stenosis at the distal left M1 and at the left MCA bifurcation, best seen on series 8, image 53. This most affects the dominant posterior left M2 branch which remains patent. Multifocal high-grade stenoses are demonstrated in the posterior left MCA division as seen on series 12, image 24. Venous sinuses: Patent. Anatomic variants: Left vertebral artery terminates in the AICA while the right supplies the basilar. Delayed phase: No abnormal enhancement of the brain. IMPRESSION: 1. Negative for emergent large vessel occlusion associated with  the acute right MCA infarct, but positive for High-grade cervical Right ICA stenosis (RADIOGRAPHIC STRING SIGN) at the level of the distal bulb. Superimposed mild to moderate irregularity of the right MCA M2 and M3 branches. 2. Severe for age generalized atherosclerosis in the head and neck. Other moderate to severe arterial stenoses: - left vertebral artery origin (very severe), - right vertebral artery origin (moderate to severe) - left ICA siphon (severe), - left MCA bifurcation and posterior left M2 branch (severe) - vertebrobasilar junction and basilar artery (moderate), - bilateral PCA branches (moderate). 3. Expected CT appearance of the small acute right MCA infarct, no associated hemorrhage or mass effect. 4. Regressed but not resolved malignant findings in the right oropharynx and bilateral cervical neck nodes. Electronically Signed   By: Genevie Ann M.D.   On: 11/10/2015 16:42   Ct Angio Neck W/cm &/or Wo/cm  11/10/2015  CLINICAL DATA:  55 year old male with acute right MCA infarct discovered yesterday for evaluation of acute left side weakness. Personal history of metastatic squamous cell carcinoma of the tongue. Initial encounter. EXAM: CT ANGIOGRAPHY HEAD AND NECK TECHNIQUE: Multidetector CT imaging of the head and neck was performed using the standard protocol during bolus administration of intravenous contrast. Multiplanar CT image reconstructions and MIPs were obtained to evaluate the vascular anatomy. Carotid stenosis measurements (when applicable) are obtained utilizing NASCET criteria, using the distal internal carotid diameter as the denominator. CONTRAST:  75 mL Isovue 370 COMPARISON:  Brain MRI 11/09/2015. Head CT without contrast 11/09/2015. Neck CT 08/03/2015. FINDINGS: CT HEAD Brain: Hypodensity corresponding to the right MCA area of restricted diffusion by MRI yesterday. No associated hemorrhage or mass effect. Otherwise stable noncontrast CT appearance of the brain since 11/09/2015.  Calvarium and skull base: No acute or suspicious osseous lesion. Paranasal sinuses: Progressed opacification of the right sphenoid sinus. Mild maxillary sinus mucosal thickening. Mastoids are clear. Orbits: Negative. Visualized scalp soft tissues are within normal limits. CTA NECK Skeleton: Carious dentition of the maxilla posteriorly on the right. Moderate to severe lower cervical spine degenerative changes. No acute or suspicious osseous lesion identified. Other neck: Right chest IJ approach porta cath in place. No superior mediastinal or axillary lymphadenopathy. Negative lung apices. Regressed right oropharyngeal soft tissue mass. Asymmetric retropharyngeal node described yesterday by MRI visible on series 6, image 197. Postradiation changes in the neck including mild diffuse pharyngeal mucosal space thickening. Regressed but not resolved right side  cervical lymphadenopathy. The malignant left side level 2 lymphadenopathy appears more unchanged compared to 08/03/2015. Negative thyroid. Negative larynx. Parotid glands, sublingual space, and submandibular glands are within normal limits. Aortic arch: 3 vessel arch configuration with moderate soft and calcified atherosclerotic plaque. Right carotid system: No brachiocephalic artery or right CCA origin stenosis despite plaque. Soft and calcified plaque in the right CCA anteriorly without stenosis. At the right carotid bifurcation there is complex soft and calcified plaque affecting the right ICA origin and bulb. Subsequent radiographic string sign stenosis (series 6, image 187) occurs just distal to the bulb (see also series 11, image 53) and affects a segment of about 5 mm. Despite this the right ICA remains patent and is otherwise negative to the skullbase. Left carotid system: No left CCA origin stenosis despite soft plaque. Soft and calcified plaque in the medial left CCA without stenosis. Mostly calcified plaque at the left carotid bifurcation affecting the  left ICA origin and bulb with less than 50 % stenosis with respect to the distal vessel. Vertebral arteries: No proximal right subclavian artery stenosis despite calcified plaque. Calcified plaque at the right vertebral artery origin with moderate to severe stenosis (series 9, image 108). Tortuous right V2 segment, but otherwise negative right vertebral artery to the skullbase. No proximal left subclavian artery stenosis despite soft and calcified plaque. Very severe (string sign) stenosis at the left vertebral artery origin (series 9, image 111). Left vertebral artery otherwise negative to the skullbase. CTA HEAD Posterior circulation: The distal right vertebral artery is mildly dominant. Both distal vertebral arteries decreased in size beyond the PICA origins which remain normal. It appears then that the smaller distal left vertebral artery terminates at the left AICA. There is mild stenosis at the vertebrobasilar junction. The basilar artery is diminutive and irregular but remains patent. The SCA origins are patent. There are fetal type bilateral PCA origins. The bilateral PCA branches are moderately irregular diffusely but remain patent. Anterior circulation: Right ICA siphon is patent with moderate calcified atherosclerosis. No hemodynamically significant right siphon stenosis. Right posterior communicating artery origin is normal. Right ICA terminus is patent. Right MCA origin, M1 segment, and right MCA bifurcation appear normal. There is mild irregularity in the proximal right MCA M2 branches. There is no M2 branch occlusion. There is more moderate right M3 branch irregularity as seen on series 12, image 8. Left ICA siphon is patent with moderate to severe soft and calcified plaque in the cavernous segment resulting in severe stenosis as seen on series 11, image 67. Despite this the left ICA terminus remains patent. Left posterior communicating artery origin is normal. Left MCA in both ACA origins are within  normal limits. Anterior communicating artery and bilateral ACA branches are within normal limits. Left MCA M1 segment is normal proximally. There is mild to moderate irregularity and stenosis at the distal left M1 and at the left MCA bifurcation, best seen on series 8, image 53. This most affects the dominant posterior left M2 branch which remains patent. Multifocal high-grade stenoses are demonstrated in the posterior left MCA division as seen on series 12, image 24. Venous sinuses: Patent. Anatomic variants: Left vertebral artery terminates in the AICA while the right supplies the basilar. Delayed phase: No abnormal enhancement of the brain. IMPRESSION: 1. Negative for emergent large vessel occlusion associated with the acute right MCA infarct, but positive for High-grade cervical Right ICA stenosis (RADIOGRAPHIC STRING SIGN) at the level of the distal bulb. Superimposed mild to moderate irregularity  of the right MCA M2 and M3 branches. 2. Severe for age generalized atherosclerosis in the head and neck. Other moderate to severe arterial stenoses: - left vertebral artery origin (very severe), - right vertebral artery origin (moderate to severe) - left ICA siphon (severe), - left MCA bifurcation and posterior left M2 branch (severe) - vertebrobasilar junction and basilar artery (moderate), - bilateral PCA branches (moderate). 3. Expected CT appearance of the small acute right MCA infarct, no associated hemorrhage or mass effect. 4. Regressed but not resolved malignant findings in the right oropharynx and bilateral cervical neck nodes. Electronically Signed   By: Genevie Ann M.D.   On: 11/10/2015 16:42   Mr Jeri Cos [id number] Contrast  11/09/2015  CLINICAL DATA:  Metastatic squamous cell carcinoma of the tongue. Response to therapy based on recent PET scan from May 2017. Acute onset of LEFT-sided weakness. EXAM: MRI HEAD WITHOUT AND WITH CONTRAST TECHNIQUE: Multiplanar, multiecho pulse sequences of the brain and surrounding  structures were obtained without and with intravenous contrast. CONTRAST:  20mL MULTIHANCE GADOBENATE DIMEGLUMINE 529 MG/ML IV SOLN COMPARISON:  CT head earlier today. FINDINGS: Restricted diffusion involving the RIGHT frontal opercular cortex, RIGHT posterior frontal cortex, and adjacent subcortical white matter. No similar abnormalities on the LEFT, elsewhere in the RIGHT hemisphere, or in the posterior fossa. No hemorrhage, mass lesion, hydrocephalus, or extra-axial fluid. Global atrophy. Mild to moderate T2 and FLAIR hyperintensities throughout the white matter, likely chronic microvascular ischemic change. Punctate foci of susceptibility in the basal ganglia bilaterally associated with brain substance loss likely representing small hemorrhagic lacunar infarcts of a chronic nature. Nonhemorrhagic lacunar infarcts also noted in the RIGHT callosal periventricular white matter, LEFT caudate, brainstem, and cerebellum. Flow voids are maintained.  No proximal large vessel occlusion. No midline abnormality. Postcontrast, no abnormal enhancement. No evidence for intracranial metastatic disease. Major dural venous sinuses are patent. No acute sinus or mastoid disease.  Negative orbits. Enhancing, enlarged RIGHT retropharyngeal node of [place] 13 x 14 mm cross-section, image 12 series 12, likely metastatic disease given its ipsilateral location. Incompletely evaluated, but suspected asymmetric enhancing oropharyngeal tumor as seen on the most inferior postcontrast slice series 12, image 1. Ipsilateral RIGHT level 2 nodes, incompletely evaluated on diffusion, likely residual disease. IMPRESSION: Acute nonhemorrhagic RIGHT frontal infarct, RIGHT MCA M3 branch distribution. No evidence for large vessel occlusion. Premature for age atrophy with widespread areas of old infarction, some of which display trace amount of chronic hemorrhage. 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 No intracranial metastatic disease is present however. Electronically Signed   By: Staci Righter M.D.   On: 11/09/2015 16:41   US Carotid Bilateral  11/09/2015  CLINICAL DATA:  55 year old male with left-sided weakness EXAM: BILATERAL CAROTID DUPLEX ULTRASOUND TECHNIQUE: Pearline Cables scale imaging, color Doppler and duplex ultrasound were performed of bilateral carotid and vertebral arteries in the neck. COMPARISON:  Head CT obtained earlier today FINDINGS: Criteria: Quantification of carotid stenosis is based on velocity parameters that correlate the residual internal carotid diameter with NASCET-based stenosis levels, using the diameter of the distal internal carotid lumen as the denominator for stenosis measurement. The following velocity measurements were obtained: RIGHT ICA:  193/35 cm/sec CCA:  [id number] cm/sec SYSTOLIC ICA/CCA RATIO:  1.3 DIASTOLIC ICA/CCA RATIO:  2.6 ECA:  95 cm/sec LEFT ICA:  115/23 cm/sec CCA:  97/10 cm/sec  SYSTOLIC ICA/CCA RATIO:  1.2 DIASTOLIC ICA/CCA RATIO:  2.3 ECA:  424 cm/sec RIGHT CAROTID ARTERY: Heterogeneous atherosclerotic plaque in the distal common carotid artery extending into the proximal internal carotid artery. By peak systolic velocity criteria the stenosis falls in the 50- 69% range. RIGHT VERTEBRAL ARTERY:  Patent with normal antegrade flow. LEFT CAROTID ARTERY: Heterogeneous atherosclerotic plaque beginning in the mid common carotid artery. Irregular partially calcified plaque at the bifurcation extends into the internal and external carotid arteries. There is significant elevation of the peak systolic velocity in the external carotid artery consistent with a greater than 70% diameter stenosis. By peak systolic velocity criteria the internal carotid stenosis remains less than 50%. LEFT VERTEBRAL ARTERY:  Patent with normal antegrade flow.  Other: Bulky bilateral cervical chain adenopathy. Patient has known head and neck cancer with recent PET-CT imaging. IMPRESSION: 1. Moderate (50-69%) stenosis proximal right internal carotid artery secondary to heterogenous atherosclerotic plaque. 2. Mild (1-49%) stenosis proximal left internal carotid artery secondary to heterogenous atherosclerotic plaque. 3. Vertebral arteries are patent with normal antegrade flow. 4. Incidental note is made of a hemodynamically significant stenosis in the left external carotid artery. Signed, Criselda Peaches, MD [facility] Electronically Signed   By: Jacqulynn Cadet M.D.   On: 11/09/2015 15:56    EKG:   Orders placed or performed during the hospital encounter of 11/09/15  . ED EKG  . ED EKG  . EKG 12-Lead  . EKG 12-Lead      Management plans discussed with the patient, family and they are in agreement.  CODE STATUS:     Code Status Orders        Start     Ordered   11/09/15 1706  Full code   Continuous     11/09/15 1705    Code Status History    Date Active Date Inactive Code Status Order ID Comments User Context   08/17/2015 11:32 AM 08/28/2015  6:17 PM Full Code [id number]  Telford Nab, RN Inpatient      TOTAL TIME TAKING CARE OF THIS PATIENT: 37 minutes.    Gladstone Lighter M.D on 11/11/2015 at 12:10 PM  Between 7am to 6pm - Pager - [phone]  After 6pm go to [email or web address] - password [email or web address] [facility]  Office  [phone]  CC: Primary care physician; Kirk Ruths., MD

## 2015-11-11 NOTE — Consult Note (Signed)
[facility] NOTE  Patient Care Team: Kirk Ruths, MD as PCP - General (Internal Medicine) Margaretha Sheffield, MD (Otolaryngology)  CHIEF COMPLAINTS/PURPOSE OF CONSULTATION: Squamous cell carcinoma base of tongue  HISTORY OF PRESENTING ILLNESS:  Ryan Rush 55 y.o.  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 Patient developed left foot drop/bilateral lower extremity tingling and numbness secondary to cisplatin chemotherapy. Plan was to start on Keytruda immunotherapy this week.  However patient presented to the hospital for fairly sudden onset of left upper extremity weakness that started 5 days ago; however patient "waited" thinking it would improve; even though he had a strong suspicion that she had a stroke.   On further workup patient noted to have right frontal nonhemorrhagic infarct- suggestive of stroke. Patient is currently on platelet therapy/followed by neurology.  Patient continues to have weakness in his left upper extremity; not so much in his left lower extremity/except for chronic foot drop.  Otherwise appetite improving. No lumps or bumps. No sores in the mouth. Gaining weight. No nausea no vomiting diarrhea.  Patient is currently in the assisted living.    ROS: A complete 10 point review of system is done which is negative except mentioned above in history of present illness  MEDICAL HISTORY:  Past Medical History  Diagnosis Date  . 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 Hypertension   . Tobacco use     SURGICAL HISTORY: Past Surgical History  Procedure Laterality Date  . Peripheral vascular catheterization N/A 08/18/2015    Procedure: Glori Luis Cath Insertion;  Surgeon: Katha Cabal, MD;  Location: [facility];  Service: Cardiovascular;   Laterality: N/A;    SOCIAL HISTORY: Social History   Social History  . Marital Status: Single    Spouse Name: N/A  . Number of Children: N/A  . Years of Education: N/A   Occupational History  . Not on file.   Social History Main Topics  . Smoking status: Current Every Day Smoker -- 1.00 packs/day    Types: Cigarettes  . Smokeless tobacco: Not on file  . Alcohol Use: No     Comment: occasional  . Drug Use: No  . Sexual Activity: Not on file   Other Topics Concern  . Not on file   Social History Narrative   Independent at baseline, lives by himself    FAMILY HISTORY: Family History  Problem Relation Age of Onset  . Cancer Mother     metastasized    ALLERGIES:  has No Known Allergies.  MEDICATIONS:  Current Facility-Administered Medications  Medication Dose Route Frequency Provider Last Rate Last Dose  . 0.9 %  sodium chloride infusion   Intravenous Continuous Gladstone Lighter, MD 75 mL/hr at 11/10/15 [id number]    . acetaminophen (TYLENOL) tablet 650 mg  650 mg Oral Q6H PRN Gladstone Lighter, MD       Or  . acetaminophen (TYLENOL) suppository 650 mg  650 mg Rectal Q6H PRN Gladstone Lighter, MD      . amLODipine (NORVASC) tablet 5 mg  5 mg Oral Daily Gladstone Lighter, MD   5 mg at 11/10/15 0945  . aspirin chewable tablet 81 mg  81 mg Oral Daily Gladstone Lighter, MD   81 mg at 11/10/15 0945  . atorvastatin (LIPITOR)  tablet 40 mg  40 mg Oral q1800 Gladstone Lighter, MD   40 mg at 11/10/15 1701  . enoxaparin (LOVENOX) injection 40 mg  40 mg Subcutaneous Q24H Gladstone Lighter, MD   40 mg at 11/10/15 2154  . famotidine (PEPCID) tablet 20 mg  20 mg Oral Daily Gladstone Lighter, MD   20 mg at 11/10/15 0945  . gabapentin (NEURONTIN) capsule 600 mg  600 mg Oral TID Gladstone Lighter, MD   600 mg at 11/10/15 2154  . loperamide (IMODIUM) capsule 2 mg  2 mg Oral PRN Gladstone Lighter, MD      . magic mouthwash w/lidocaine  5 mL Oral QID PRN Gladstone Lighter, MD      . morphine  (MS CONTIN) 12 hr tablet 30 mg  30 mg Oral Q12H Gladstone Lighter, MD   30 mg at 11/10/15 2154  . nicotine (NICODERM CQ - dosed in mg/24 hours) patch 21 mg  21 mg Transdermal Daily Gladstone Lighter, MD      . ondansetron (ZOFRAN) tablet 4 mg  4 mg Oral Q6H PRN Gladstone Lighter, MD       Or  . ondansetron (ZOFRAN) injection 4 mg  4 mg Intravenous Q6H PRN Gladstone Lighter, MD      . pantoprazole (PROTONIX) EC tablet 40 mg  40 mg Oral Daily Gladstone Lighter, MD   40 mg at 11/10/15 0945  . sodium chloride flush (NS) 0.9 % injection 3 mL  3 mL Intravenous Q12H Gladstone Lighter, MD   3 mL at 11/10/15 2155      .  PHYSICAL EXAMINATION:   Filed Vitals:   11/11/15 0021 11/11/15 0420  BP: 125/61 158/75  Pulse: 77 79  Temp: 98.4 F (36.9 C) 97.9 F (36.6 C)  Resp: 18 18   Filed Weights   11/09/15 0958 11/09/15 1258 11/09/15 1900  Weight: 150 lb (68.04 kg) 150 lb (68.04 kg) 140 lb 3.2 oz (63.594 kg)    GENERAL: Well-nourished well-developed; Alert, no distress and comfortable. Alone.  EYES: no pallor or icterus OROPHARYNX: No oral ulcerations. NECK: supple, no masses felt LYMPH: No lymphadenopathy noted. LUNGS: clear to auscultation and No wheeze or crackles HEART/CVS: regular rate & rhythm and no murmurs; No lower extremity edema ABDOMEN:abdomen soft, non-tender and normal bowel sounds Musculoskeletal:no cyanosis of digits and no clubbing  PSYCH: alert & oriented x 3 with fluent speech NEURO: Left upper extremity weakness noted[ patient barely able to grasp my finger]; chronic mild foot drop noted in Left LE.  SKIN: no rashes or significant lesions  LABORATORY DATA:  I have reviewed the data as listed Lab Results  Component Value Date   WBC 5.1 11/10/2015   HGB 12.8* 11/10/2015   HCT 37.8* 11/10/2015   MCV 93.0 11/10/2015   PLT 222 11/10/2015    Recent Labs  09/30/15 0810  10/23/15 0902 11/09/15 0955 11/10/15 0513  NA 133*  < > 135 135 138  K 4.3  < > 4.0  4.1 3.6  CL 104  < > 103 101 108  CO2 23  < > 27 25 22   GLUCOSE 118*  < > 92 94 86  BUN 12  < > 17 16 16   CREATININE 0.72  < > 0.66 0.78 0.72  CALCIUM 8.8*  < > 9.5 9.7 9.0  GFRNONAA >60  < > >60 >60 >60  GFRAA >60  < > >60 >60 >60  PROT 7.4  --  7.8 8.3*  --   ALBUMIN 3.3*  --  3.9 4.2  --   AST 16  --  15 25  --   ALT 10*  --  10* 14*  --   ALKPHOS 73  --  96 91  --   BILITOT 0.4  --  0.4 0.6  --   < > = values in this interval not displayed.  RADIOGRAPHIC STUDIES: I have personally reviewed the radiological images as listed and agreed with the findings in the report. Ct Angio Head W/cm &/or Wo Cm  11/10/2015  CLINICAL DATA:  55 year old male with acute right MCA infarct discovered yesterday for evaluation of acute left side weakness. Personal history of metastatic squamous cell carcinoma of the tongue. Initial encounter. EXAM: CT ANGIOGRAPHY HEAD AND NECK TECHNIQUE: Multidetector CT imaging of the head and neck was performed using the standard protocol during bolus administration of intravenous contrast. Multiplanar CT image reconstructions and MIPs were obtained to evaluate the vascular anatomy. Carotid stenosis measurements (when applicable) are obtained utilizing NASCET criteria, using the distal internal carotid diameter as the denominator. CONTRAST:  75 mL Isovue 370 COMPARISON:  Brain MRI 11/09/2015. Head CT without contrast 11/09/2015. Neck CT 08/03/2015. FINDINGS: CT HEAD Brain: Hypodensity corresponding to the right MCA area of restricted diffusion by MRI yesterday. No associated hemorrhage or mass effect. Otherwise stable noncontrast CT appearance of the brain since 11/09/2015. Calvarium and skull base: No acute or suspicious osseous lesion. Paranasal sinuses: Progressed opacification of the right sphenoid sinus. Mild maxillary sinus mucosal thickening. Mastoids are clear. Orbits: Negative. Visualized scalp soft tissues are within normal limits. CTA NECK Skeleton: Carious dentition of  the maxilla posteriorly on the right. Moderate to severe lower cervical spine degenerative changes. No acute or suspicious osseous lesion identified. Other neck: Right chest IJ approach porta cath in place. No superior mediastinal or axillary lymphadenopathy. Negative lung apices. Regressed right oropharyngeal soft tissue mass. Asymmetric retropharyngeal node described yesterday by MRI visible on series 6, image 197. Postradiation changes in the neck including mild diffuse pharyngeal mucosal space thickening. Regressed but not resolved right side cervical lymphadenopathy. The malignant left side level 2 lymphadenopathy appears more unchanged compared to 08/03/2015. Negative thyroid. Negative larynx. Parotid glands, sublingual space, and submandibular glands are within normal limits. Aortic arch: 3 vessel arch configuration with moderate soft and calcified atherosclerotic plaque. Right carotid system: No brachiocephalic artery or right CCA origin stenosis despite plaque. Soft and calcified plaque in the right CCA anteriorly without stenosis. At the right carotid bifurcation there is complex soft and calcified plaque affecting the right ICA origin and bulb. Subsequent radiographic string sign stenosis (series 6, image 187) occurs just distal to the bulb (see also series 11, image 53) and affects a segment of about 5 mm. Despite this the right ICA remains patent and is otherwise negative to the skullbase. Left carotid system: No left CCA origin stenosis despite soft plaque. Soft and calcified plaque in the medial left CCA without stenosis. Mostly calcified plaque at the left carotid bifurcation affecting the left ICA origin and bulb with less than 50 % stenosis with respect to the distal vessel. Vertebral arteries: No proximal right subclavian artery stenosis despite calcified plaque. Calcified plaque at the right vertebral artery origin with moderate to severe stenosis (series 9, image 108). Tortuous right V2 segment,  but otherwise negative right vertebral artery to the skullbase. No proximal left subclavian artery stenosis despite soft and calcified plaque. Very severe (string sign) stenosis at the left vertebral artery origin (series 9, image 111). Left vertebral  artery otherwise negative to the skullbase. CTA HEAD Posterior circulation: The distal right vertebral artery is mildly dominant. Both distal vertebral arteries decreased in size beyond the PICA origins which remain normal. It appears then that the smaller distal left vertebral artery terminates at the left AICA. There is mild stenosis at the vertebrobasilar junction. The basilar artery is diminutive and irregular but remains patent. The SCA origins are patent. There are fetal type bilateral PCA origins. The bilateral PCA branches are moderately irregular diffusely but remain patent. Anterior circulation: Right ICA siphon is patent with moderate calcified atherosclerosis. No hemodynamically significant right siphon stenosis. Right posterior communicating artery origin is normal. Right ICA terminus is patent. Right MCA origin, M1 segment, and right MCA bifurcation appear normal. There is mild irregularity in the proximal right MCA M2 branches. There is no M2 branch occlusion. There is more moderate right M3 branch irregularity as seen on series 12, image 8. Left ICA siphon is patent with moderate to severe soft and calcified plaque in the cavernous segment resulting in severe stenosis as seen on series 11, image 67. Despite this the left ICA terminus remains patent. Left posterior communicating artery origin is normal. Left MCA in both ACA origins are within normal limits. Anterior communicating artery and bilateral ACA branches are within normal limits. Left MCA M1 segment is normal proximally. There is mild to moderate irregularity and stenosis at the distal left M1 and at the left MCA bifurcation, best seen on series 8, image 53. This most affects the dominant  posterior left M2 branch which remains patent. Multifocal high-grade stenoses are demonstrated in the posterior left MCA division as seen on series 12, image 24. Venous sinuses: Patent. Anatomic variants: Left vertebral artery terminates in the AICA while the right supplies the basilar. Delayed phase: No abnormal enhancement of the brain. IMPRESSION: 1. Negative for emergent large vessel occlusion associated with the acute right MCA infarct, but positive for High-grade cervical Right ICA stenosis (RADIOGRAPHIC STRING SIGN) at the level of the distal bulb. Superimposed mild to moderate irregularity of the right MCA M2 and M3 branches. 2. Severe for age generalized atherosclerosis in the head and neck. Other moderate to severe arterial stenoses: - left vertebral artery origin (very severe), - right vertebral artery origin (moderate to severe) - left ICA siphon (severe), - left MCA bifurcation and posterior left M2 branch (severe) - vertebrobasilar junction and basilar artery (moderate), - bilateral PCA branches (moderate). 3. Expected CT appearance of the small acute right MCA infarct, no associated hemorrhage or mass effect. 4. Regressed but not resolved malignant findings in the right oropharynx and bilateral cervical neck nodes. Electronically Signed   By: Genevie Ann M.D.   On: 11/10/2015 16:42   Ct Head Wo Contrast  11/09/2015  CLINICAL DATA:  Left-sided numbness, weakness since Saturday. EXAM: CT HEAD WITHOUT CONTRAST TECHNIQUE: Contiguous axial images were obtained from the base of the skull through the vertex without intravenous contrast. COMPARISON:  None. FINDINGS: No acute intracranial abnormality. Specifically, no hemorrhage, hydrocephalus, mass lesion, acute infarction, or significant intracranial injury. No acute calvarial abnormality. Visualized paranasal sinuses and mastoids clear. Orbital soft tissues unremarkable. IMPRESSION: No acute intracranial abnormality. Electronically Signed   By: Rolm Baptise  M.D.   On: 11/09/2015 12:20   Ct Angio Neck W/cm &/or Wo/cm  11/10/2015  CLINICAL DATA:  55 year old male with acute right MCA infarct discovered yesterday for evaluation of acute left side weakness. Personal history of metastatic squamous cell carcinoma of the tongue.  Initial encounter. EXAM: CT ANGIOGRAPHY HEAD AND NECK TECHNIQUE: Multidetector CT imaging of the head and neck was performed using the standard protocol during bolus administration of intravenous contrast. Multiplanar CT image reconstructions and MIPs were obtained to evaluate the vascular anatomy. Carotid stenosis measurements (when applicable) are obtained utilizing NASCET criteria, using the distal internal carotid diameter as the denominator. CONTRAST:  75 mL Isovue 370 COMPARISON:  Brain MRI 11/09/2015. Head CT without contrast 11/09/2015. Neck CT 08/03/2015. FINDINGS: CT HEAD Brain: Hypodensity corresponding to the right MCA area of restricted diffusion by MRI yesterday. No associated hemorrhage or mass effect. Otherwise stable noncontrast CT appearance of the brain since 11/09/2015. Calvarium and skull base: No acute or suspicious osseous lesion. Paranasal sinuses: Progressed opacification of the right sphenoid sinus. Mild maxillary sinus mucosal thickening. Mastoids are clear. Orbits: Negative. Visualized scalp soft tissues are within normal limits. CTA NECK Skeleton: Carious dentition of the maxilla posteriorly on the right. Moderate to severe lower cervical spine degenerative changes. No acute or suspicious osseous lesion identified. Other neck: Right chest IJ approach porta cath in place. No superior mediastinal or axillary lymphadenopathy. Negative lung apices. Regressed right oropharyngeal soft tissue mass. Asymmetric retropharyngeal node described yesterday by MRI visible on series 6, image 197. Postradiation changes in the neck including mild diffuse pharyngeal mucosal space thickening. Regressed but not resolved right side cervical  lymphadenopathy. The malignant left side level 2 lymphadenopathy appears more unchanged compared to 08/03/2015. Negative thyroid. Negative larynx. Parotid glands, sublingual space, and submandibular glands are within normal limits. Aortic arch: 3 vessel arch configuration with moderate soft and calcified atherosclerotic plaque. Right carotid system: No brachiocephalic artery or right CCA origin stenosis despite plaque. Soft and calcified plaque in the right CCA anteriorly without stenosis. At the right carotid bifurcation there is complex soft and calcified plaque affecting the right ICA origin and bulb. Subsequent radiographic string sign stenosis (series 6, image 187) occurs just distal to the bulb (see also series 11, image 53) and affects a segment of about 5 mm. Despite this the right ICA remains patent and is otherwise negative to the skullbase. Left carotid system: No left CCA origin stenosis despite soft plaque. Soft and calcified plaque in the medial left CCA without stenosis. Mostly calcified plaque at the left carotid bifurcation affecting the left ICA origin and bulb with less than 50 % stenosis with respect to the distal vessel. Vertebral arteries: No proximal right subclavian artery stenosis despite calcified plaque. Calcified plaque at the right vertebral artery origin with moderate to severe stenosis (series 9, image 108). Tortuous right V2 segment, but otherwise negative right vertebral artery to the skullbase. No proximal left subclavian artery stenosis despite soft and calcified plaque. Very severe (string sign) stenosis at the left vertebral artery origin (series 9, image 111). Left vertebral artery otherwise negative to the skullbase. CTA HEAD Posterior circulation: The distal right vertebral artery is mildly dominant. Both distal vertebral arteries decreased in size beyond the PICA origins which remain normal. It appears then that the smaller distal left vertebral artery terminates at the left  AICA. There is mild stenosis at the vertebrobasilar junction. The basilar artery is diminutive and irregular but remains patent. The SCA origins are patent. There are fetal type bilateral PCA origins. The bilateral PCA branches are moderately irregular diffusely but remain patent. Anterior circulation: Right ICA siphon is patent with moderate calcified atherosclerosis. No hemodynamically significant right siphon stenosis. Right posterior communicating artery origin is normal. Right ICA terminus is patent. Right  MCA origin, M1 segment, and right MCA bifurcation appear normal. There is mild irregularity in the proximal right MCA M2 branches. There is no M2 branch occlusion. There is more moderate right M3 branch irregularity as seen on series 12, image 8. Left ICA siphon is patent with moderate to severe soft and calcified plaque in the cavernous segment resulting in severe stenosis as seen on series 11, image 67. Despite this the left ICA terminus remains patent. Left posterior communicating artery origin is normal. Left MCA in both ACA origins are within normal limits. Anterior communicating artery and bilateral ACA branches are within normal limits. Left MCA M1 segment is normal proximally. There is mild to moderate irregularity and stenosis at the distal left M1 and at the left MCA bifurcation, best seen on series 8, image 53. This most affects the dominant posterior left M2 branch which remains patent. Multifocal high-grade stenoses are demonstrated in the posterior left MCA division as seen on series 12, image 24. Venous sinuses: Patent. Anatomic variants: Left vertebral artery terminates in the AICA while the right supplies the basilar. Delayed phase: No abnormal enhancement of the brain. IMPRESSION: 1. Negative for emergent large vessel occlusion associated with the acute right MCA infarct, but positive for High-grade cervical Right ICA stenosis (RADIOGRAPHIC STRING SIGN) at the level of the distal bulb.  Superimposed mild to moderate irregularity of the right MCA M2 and M3 branches. 2. Severe for age generalized atherosclerosis in the head and neck. Other moderate to severe arterial stenoses: - left vertebral artery origin (very severe), - right vertebral artery origin (moderate to severe) - left ICA siphon (severe), - left MCA bifurcation and posterior left M2 branch (severe) - vertebrobasilar junction and basilar artery (moderate), - bilateral PCA branches (moderate). 3. Expected CT appearance of the small acute right MCA infarct, no associated hemorrhage or mass effect. 4. Regressed but not resolved malignant findings in the right oropharynx and bilateral cervical neck nodes. Electronically Signed   By: Genevie Ann M.D.   On: 11/10/2015 16:42   Mr Jeri Cos [id number] Contrast  11/09/2015  CLINICAL DATA:  Metastatic squamous cell carcinoma of the tongue. Response to therapy based on recent PET scan from May 2017. Acute onset of LEFT-sided weakness. EXAM: MRI HEAD WITHOUT AND WITH CONTRAST TECHNIQUE: Multiplanar, multiecho pulse sequences of the brain and surrounding structures were obtained without and with intravenous contrast. CONTRAST:  66mL MULTIHANCE GADOBENATE DIMEGLUMINE 529 MG/ML IV SOLN COMPARISON:  CT head earlier today. FINDINGS: Restricted diffusion involving the RIGHT frontal opercular cortex, RIGHT posterior frontal cortex, and adjacent subcortical white matter. No similar abnormalities on the LEFT, elsewhere in the RIGHT hemisphere, or in the posterior fossa. No hemorrhage, mass lesion, hydrocephalus, or extra-axial fluid. Global atrophy. Mild to moderate T2 and FLAIR hyperintensities throughout the white matter, likely chronic microvascular ischemic change. Punctate foci of susceptibility in the basal ganglia bilaterally associated with brain substance loss likely representing small hemorrhagic lacunar infarcts of a chronic nature. Nonhemorrhagic lacunar infarcts also noted in the RIGHT callosal  periventricular white matter, LEFT caudate, brainstem, and cerebellum. Flow voids are maintained.  No proximal large vessel occlusion. No midline abnormality. Postcontrast, no abnormal enhancement. No evidence for intracranial metastatic disease. Major dural venous sinuses are patent. No acute sinus or mastoid disease.  Negative orbits. Enhancing, enlarged RIGHT retropharyngeal node of Rouviere 13 x 14 mm cross-section, image 12 series 12, likely metastatic disease given its ipsilateral location. Incompletely evaluated, but suspected asymmetric enhancing oropharyngeal tumor as seen on the  most inferior postcontrast slice series 12, image 1. Ipsilateral RIGHT level 2 nodes, incompletely evaluated on diffusion, likely residual disease. IMPRESSION: Acute nonhemorrhagic RIGHT frontal infarct, RIGHT MCA M3 branch distribution. No evidence for large vessel occlusion. Premature for age atrophy with widespread areas of old infarction, some of which display trace amount of chronic hemorrhage. While recent PET scan demonstrated positive response to therapy, there are areas of concern with regard to residual metastatic oropharyngeal squamous cell carcinoma, most notably a greater than 1 cm RIGHT retropharyngeal node. No intracranial metastatic disease is present however. Electronically Signed   By: Staci Righter M.D.   On: 11/09/2015 16:41   US Carotid Bilateral  11/09/2015  CLINICAL DATA:  55 year old male with left-sided weakness EXAM: BILATERAL CAROTID DUPLEX ULTRASOUND TECHNIQUE: Pearline Cables scale imaging, color Doppler and duplex ultrasound were performed of bilateral carotid and vertebral arteries in the neck. COMPARISON:  Head CT obtained earlier today FINDINGS: Criteria: Quantification of carotid stenosis is based on velocity parameters that correlate the residual internal carotid diameter with NASCET-based stenosis levels, using the diameter of the distal internal carotid lumen as the denominator for stenosis measurement.  The following velocity measurements were obtained: RIGHT ICA:  193/35 cm/sec CCA:  [id number] cm/sec SYSTOLIC ICA/CCA RATIO:  1.3 DIASTOLIC ICA/CCA RATIO:  2.6 ECA:  95 cm/sec LEFT ICA:  115/23 cm/sec CCA:  [id number] cm/sec SYSTOLIC ICA/CCA RATIO:  1.2 DIASTOLIC ICA/CCA RATIO:  2.3 ECA:  424 cm/sec RIGHT CAROTID ARTERY: Heterogeneous atherosclerotic plaque in the distal common carotid artery extending into the proximal internal carotid artery. By peak systolic velocity criteria the stenosis falls in the 50- 69% range. RIGHT VERTEBRAL ARTERY:  Patent with normal antegrade flow. LEFT CAROTID ARTERY: Heterogeneous atherosclerotic plaque beginning in the mid common carotid artery. Irregular partially calcified plaque at the bifurcation extends into the internal and external carotid arteries. There is significant elevation of the peak systolic velocity in the external carotid artery consistent with a greater than 70% diameter stenosis. By peak systolic velocity criteria the internal carotid stenosis remains less than 50%. LEFT VERTEBRAL ARTERY:  Patent with normal antegrade flow. Other: Bulky bilateral cervical chain adenopathy. Patient has known head and neck cancer with recent PET-CT imaging. IMPRESSION: 1. Moderate (50-69%) stenosis proximal right internal carotid artery secondary to heterogenous atherosclerotic plaque. 2. Mild (1-49%) stenosis proximal left internal carotid artery secondary to heterogenous atherosclerotic plaque. 3. Vertebral arteries are patent with normal antegrade flow. 4. Incidental note is made of a hemodynamically significant stenosis in the left external carotid artery. Signed, Criselda Peaches, MD [facility] Radiology Electronically Signed   By: Jacqulynn Cadet M.D.   On: 11/09/2015 15:56   Nm Pet Image Restag (ps) Skull Base To Thigh  10/21/2015  CLINICAL DATA:  Subsequent treatment strategy for squamous cell carcinoma of head neck. EXAM:  NUCLEAR MEDICINE PET SKULL BASE TO THIGH TECHNIQUE: 12.5 mCi F-18 FDG was injected intravenously. Full-ring PET imaging was performed from the skull base to thigh after the radiotracer. CT data was obtained and used for attenuation correction and anatomic localization. FASTING BLOOD GLUCOSE:  Value: 91 mg/dl COMPARISON:  08/18/2015. FINDINGS: NECK Right tongue base and tonsillar hypermetabolism have resolved. Persistent but improved bilateral cervical nodal hypermetabolism. For example, a left sided level 2 jugular chain node measures 1.5 cm and a S.U.V. max of 6.0 on image 41/ series 3. Compare 1.9 cm and a S.U.V. max of 10.7 on the prior. CHEST No areas of abnormal hypermetabolism. ABDOMEN/PELVIS Resolution of  hepatic hypermetabolism. gastrohepatic ligament/peripancreatic node measures 1.4 cm and a S.U.V. max of 3.6 on image 158/series 3. Compare 1.5 cm and a S.U.V. max of 4.5 on the prior. SKELETON No abnormal marrow activity. CT IMAGES PERFORMED FOR ATTENUATION CORRECTION Cerebral and more so cerebellar atrophy with resultant prominence of the extra-axial spaces. Advanced for age carotid atherosclerosis. Right Port-A-Cath which terminates at the high right atrium. Normal heart size, without pericardial effusion. Multivessel coronary artery atherosclerosis. Right lower lobe ill-defined clustered nodules, including on image 121/series 3, new. Punctate right renal collecting system calculus. Abdominal aortic atherosclerosis. IMPRESSION: 1. Response to therapy. Resolution of right tongue base/palatine tonsil primary hypermetabolism. 2. Improvement in bilateral cervical and isolated upper abdominal nodal metastasis. 3. Resolved hepatic hypermetabolism. 4. No new sites of disease. 5.  Atherosclerosis, including within the coronary arteries. Electronically Signed   By: Abigail Miyamoto M.D.   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  #  Squamous cell carcinoma of base of tongue metastatic to the liver and bone- status post 3 cycles of TPF palliative; partial response noted the PET scan. Given the acute stroke; Keytruda this week. Plan starting next week.  # Left upper extremity sudden onset of weakness/acute frontal stroke- CTA- positive for High-grade cervical Right ICA stenosis  at the level of the distal bulb. followed by neurology.  # Left lower extremity pain/tingling and numbness- mild footdrop noted-improving. Continue Neurontin 300 mg 3 times a day.   # Left hip pain improved-secondary to malignancy continue MS Contin 30 twice a day at this time.  # The above plan of care was discussed the patient in detail. He agrees.   # Also discussed with Dr.Kalisetti; plan discharge patient to [facility].   Thank you Dr.Kalisetti for allowing me to participate in the care of your pleasant patient. Please do not hesitate to contact me with questions or concerns in the interim.      Cammie Sickle, MD 11/11/2015 7:23 AM

## 2015-11-11 NOTE — Progress Notes (Signed)
Physical Therapy Treatment Patient Details Name: Ryan Rush MRN: [id number] DOB: 1961/04/23 Today's Date: 11/11/2015    History of Present Illness Pt admitted for R frontal CVA. Pt with history of squamous cell carcinoma of tongue. Pt with history of HTN, cancer with mets to lever/bone. Pt has been on chemo since Feb 2017. Pt then complaints of L UE weakness/numbness x 2 days.    PT Comments    Pt is making good progress towards goals with improved dynamic balance with higher level activities. Pt with good endurance during ambulation. Does not require any AD at this time. Pt motivated to return back to ALF and perform therapy.  Follow Up Recommendations   (Assisted living with HHPT)     Equipment Recommendations       Recommendations for Other Services       Precautions / Restrictions Precautions Precautions: Fall Restrictions Weight Bearing Restrictions: No    Mobility  Bed Mobility Overal bed mobility: Independent             General bed mobility comments: safe technique performed. Once seated at EOB, pt with no LOB.  Transfers Overall transfer level: Independent Equipment used: None Transfers: Sit to/from Stand Sit to Stand: Independent         General transfer comment: safe technique performed without AD  Ambulation/Gait Ambulation/Gait assistance: Min guard Ambulation Distance (Feet): 200 Feet Assistive device: None Gait Pattern/deviations: Step-through pattern     General Gait Details: ambulated with reciprocal gait pattern and symmetrical step length. Improved stability noted during ambulation with decreased lateral sway. Pt able to carry conversation during ambulation   Stairs            Wheelchair Mobility    Modified Rankin (Stroke Patients Only)       Balance                                    Cognition Arousal/Alertness: Awake/alert Behavior During Therapy: WFL for tasks assessed/performed Overall Cognitive  Status: Within Functional Limits for tasks assessed                      Exercises Other Exercises Other Exercises: Standing balance activities performed including B LE heel raises, mini squats, single leg hip ab kicks, and B UE reaches towards mirror grossly 10" away. Pt able to perform balance activities x 12 reps with cga. 1 LOB noted with min assist for correction    General Comments        Pertinent Vitals/Pain Pain Assessment: No/denies pain    Home Living                      Prior Function            PT Goals (current goals can now be found in the care plan section) Acute Rehab PT Goals Patient Stated Goal: To regain the use of his left hand PT Goal Formulation: With patient Time For Goal Achievement: 11/24/15 Potential to Achieve Goals: Good Progress towards PT goals: Progressing toward goals    Frequency  7X/week    PT Plan Current plan remains appropriate    Co-evaluation             End of Session Equipment Utilized During Treatment: Gait belt Activity Tolerance: Patient tolerated treatment well Patient left: in chair     Time: [id number] PT Time Calculation (min) (ACUTE  ONLY): 15 min  Charges:  $Neuromuscular Re-education: 8-22 mins                    G Codes:      Jeremy Ditullio 11-14-2015, 11:32 AM  Greggory Stallion, PT, DPT [phone]

## 2015-11-11 NOTE — Progress Notes (Signed)
A&O. Independent. LUE extremely weak. No other deficits noted. IV fluids infusing.

## 2015-11-13 ENCOUNTER — Inpatient Hospital Stay: Payer: MEDICAID

## 2015-11-13 ENCOUNTER — Inpatient Hospital Stay: Payer: MEDICAID | Admitting: Internal Medicine

## 2015-12-04 ENCOUNTER — Inpatient Hospital Stay: Payer: Medicaid Other | Attending: Internal Medicine | Admitting: Internal Medicine

## 2015-12-04 ENCOUNTER — Inpatient Hospital Stay: Payer: Medicaid Other

## 2015-12-04 VITALS — BP 168/84 | HR 98 | Temp 96.5°F | Resp 18 | Wt 147.5 lb

## 2015-12-04 DIAGNOSIS — F17210 Nicotine dependence, cigarettes, uncomplicated: Secondary | ICD-10-CM | POA: Insufficient documentation

## 2015-12-04 DIAGNOSIS — C099 Malignant neoplasm of tonsil, unspecified: Secondary | ICD-10-CM

## 2015-12-04 DIAGNOSIS — T451X5A Adverse effect of antineoplastic and immunosuppressive drugs, initial encounter: Secondary | ICD-10-CM

## 2015-12-04 DIAGNOSIS — Z79899 Other long term (current) drug therapy: Secondary | ICD-10-CM | POA: Diagnosis not present

## 2015-12-04 DIAGNOSIS — Z5111 Encounter for antineoplastic chemotherapy: Secondary | ICD-10-CM | POA: Diagnosis present

## 2015-12-04 DIAGNOSIS — M25552 Pain in left hip: Secondary | ICD-10-CM | POA: Diagnosis not present

## 2015-12-04 DIAGNOSIS — C01 Malignant neoplasm of base of tongue: Secondary | ICD-10-CM | POA: Diagnosis not present

## 2015-12-04 DIAGNOSIS — G620 Drug-induced polyneuropathy: Secondary | ICD-10-CM

## 2015-12-04 DIAGNOSIS — C787 Secondary malignant neoplasm of liver and intrahepatic bile duct: Secondary | ICD-10-CM | POA: Insufficient documentation

## 2015-12-04 DIAGNOSIS — I10 Essential (primary) hypertension: Secondary | ICD-10-CM | POA: Insufficient documentation

## 2015-12-04 DIAGNOSIS — C7951 Secondary malignant neoplasm of bone: Secondary | ICD-10-CM | POA: Diagnosis not present

## 2015-12-04 DIAGNOSIS — G629 Polyneuropathy, unspecified: Secondary | ICD-10-CM | POA: Insufficient documentation

## 2015-12-04 LAB — COMPREHENSIVE METABOLIC PANEL
ALBUMIN: 4.1 g/dL (ref 3.5–5.0)
ALK PHOS: 97 U/L (ref 38–126)
ALT: 13 U/L — ABNORMAL LOW (ref 17–63)
ANION GAP: 6 (ref 5–15)
AST: 20 U/L (ref 15–41)
BILIRUBIN TOTAL: 0.3 mg/dL (ref 0.3–1.2)
BUN: 16 mg/dL (ref 6–20)
CALCIUM: 9.6 mg/dL (ref 8.9–10.3)
CO2: 26 mmol/L (ref 22–32)
Chloride: 104 mmol/L (ref 101–111)
Creatinine, Ser: 0.6 mg/dL — ABNORMAL LOW (ref 0.61–1.24)
GFR calc Af Amer: >60 mL/min (ref >60)
GFR calc non Af Amer: >60 mL/min (ref >60)
GLUCOSE: 109 mg/dL — AB (ref 65–99)
Potassium: 3.4 mmol/L — ABNORMAL LOW (ref 3.5–5.1)
SODIUM: 136 mmol/L (ref 135–145)
TOTAL PROTEIN: 8.5 g/dL — AB (ref 6.5–8.1)

## 2015-12-04 LAB — CBC WITH DIFFERENTIAL/PLATELET
BASOS ABS: 0.1 10*3/uL (ref 0–0.1)
BASOS PCT: 1 %
EOS ABS: 0.3 10*3/uL (ref 0–0.7)
Eosinophils Relative: 3 %
HEMATOCRIT: 37.1 % — AB (ref 40.0–52.0)
HEMOGLOBIN: 12.9 g/dL — AB (ref 13.0–18.0)
Lymphocytes Relative: 14 %
Lymphs Abs: 1.1 10*3/uL (ref 1.0–3.6)
MCH: 32 pg (ref 26.0–34.0)
MCHC: 34.9 g/dL (ref 32.0–36.0)
MCV: 91.6 fL (ref 80.0–100.0)
Monocytes Absolute: 0.7 10*3/uL (ref 0.2–1.0)
Monocytes Relative: 9 %
NEUTROS ABS: 5.7 10*3/uL (ref 1.4–6.5)
NEUTROS PCT: 73 %
Platelets: 230 10*3/uL (ref 150–440)
RBC: 4.05 MIL/uL — AB (ref 4.40–5.90)
RDW: 14.5 % (ref 11.5–14.5)
WBC: 7.9 10*3/uL (ref 3.8–10.6)

## 2015-12-04 LAB — TSH: TSH: 1.005 u[IU]/mL (ref 0.350–4.500)

## 2015-12-04 MED ORDER — GABAPENTIN 300 MG PO CAPS: 600.0000 mg | ORAL_CAPSULE | Freq: Three times a day (TID) | ORAL | Status: DC

## 2015-12-04 MED ORDER — HEPARIN SOD (PORK) LOCK FLUSH 100 UNIT/ML IV SOLN
500.0000 [IU] | Freq: Once | INTRAVENOUS | Status: AC | PRN
  Administered 2015-12-04: 500 [IU]
  Filled 2015-12-04: qty 5

## 2015-12-04 MED ORDER — SODIUM CHLORIDE 0.9 % IV SOLN
200.0000 mg | Freq: Once | INTRAVENOUS | Status: AC
  Administered 2015-12-04: 200 mg via INTRAVENOUS
  Filled 2015-12-04: qty 8

## 2015-12-04 MED ORDER — SODIUM CHLORIDE 0.9% FLUSH
10.0000 mL | INTRAVENOUS | Status: DC | PRN
  Administered 2015-12-04: 10 mL
  Filled 2015-12-04: qty 10

## 2015-12-04 MED ORDER — MORPHINE SULFATE ER 30 MG PO TBCR: 30.0000 mg | EXTENDED_RELEASE_TABLET | Freq: Two times a day (BID) | ORAL | Status: DC

## 2015-12-04 MED ORDER — SODIUM CHLORIDE 0.9 % IV SOLN
Freq: Once | INTRAVENOUS | Status: AC
  Administered 2015-12-04: 11:00:00 via INTRAVENOUS
  Filled 2015-12-04: qty 1000

## 2015-12-04 NOTE — Assessment & Plan Note (Addendum)
#   Squamous cell carcinoma of base of tongue metastatic to the liver and bone- palliative chemo s/p cycle #3 with mild to moderate difficulties  Patient had significant improvement on TPF chemo.  PET scan shows-significant improvement of the base of tongue lesion; liver lesions/lesions resolved; improved but still present left cervical lymph node about 2 cm in size.  # Discussed with the patient given the metastatic disease/incurable- in general would recommend indefinite therapy. However, given his significant neuropathy footdrop; and recent acute stroke- I would recommend holding further chemotherapy at this time.  I would recommend starting Keytruda every 3 weeks; starting today.   # Left sided stroke- improved; plan vascular stenting with dr.Schneir.I spoke to Dr.Schneir; plan stenting in mid- July 2017.   # Left lower extremity pain/tingling and numbness- mild footdrop noted-improving. Continue  Neurontin 300 mg 3 times a day.   # Left hip pain improved- continue MS Contin 30 twice a day at this time.  # Follow-up with me in 3 weeks with CBC CMP/TSH/Keytruda infusion.

## 2015-12-04 NOTE — Progress Notes (Signed)
[facility] OFFICE PROGRESS NOTE  Patient Care Team: Kirk Ruths, MD as PCP - General (Internal Medicine) Margaretha Sheffield, MD (Otolaryngology) Katha Cabal, MD (Vascular Surgery)   SUMMARY OF ONCOLOGIC HISTORY: # MARCH 2017- SQUAMOUS CELL CA of Right tongue base/tonsill-bilateral cervical LN/abdominal LN with metastases to liver/ bone; STAGE IV; Feb 2017- Started TPF q3 W x3; May 2017- Partial response.   # June 30th 2017- keytruda q 3 W  # April 26th- Disc Taxotere sec to foot drop [#3 cycle];   # Left hip pain/bone met- MS contin; June 2017- Acute Stroke-Left carotid stenosis [Dr.Schneir-Stenting July 2017]   INTERVAL HISTORY: A very pleasant 55 year old male patient with above history of stage IV squamous cell carcinoma of the right tongue base is currently status post palliative chemotherapy with cycle #3 5-FU cisplatin Taxotere- 5-6 weeks ago had an excellent response on a follow-up PET scan.  However patient's clinical course has been complicated by acute stroke involving the left upper extremity; is currently on aspirin and Plavix. Awaiting to be reevaluated by vascular surgery for carotid stenting.  Patient has noted significant improvement of his left upper extremity strength; he continues to have mild weakness of his left lower extremity [footdrop from his cisplatin/Taxotere].    Patient continues to be on Neurontin and MS Contin for pain; otherwise  Otherwise appetite improving. No new lumps or bumps. No sores in the mouth. Gaining weight. No nausea no vomiting diarrhea.  Patient is currently in the assisted living.   REVIEW OF SYSTEMS:  A complete 10 point review of system is done which is negative except mentioned above/history of present illness.   PAST MEDICAL HISTORY :  Past Medical History  Diagnosis Date  . 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 Hypertension   . Tobacco use     PAST  SURGICAL HISTORY :   Past Surgical History  Procedure Laterality Date  . Peripheral vascular catheterization N/A 08/18/2015    Procedure: Glori Luis Cath Insertion;  Surgeon: Katha Cabal, MD;  Location: [facility];  Service: Cardiovascular;  Laterality: N/A;    FAMILY HISTORY :   Family History  Problem Relation Age of Onset  . Cancer Mother     metastasized    SOCIAL HISTORY:   Social History  Substance Use Topics  . Smoking status: Current Every Day Smoker -- 1.00 packs/day    Types: Cigarettes  . Smokeless tobacco: Not on file  . Alcohol Use: No     Comment: occasional    ALLERGIES:  has No Known Allergies.  MEDICATIONS:  Current Outpatient Prescriptions  Medication Sig Dispense Refill  . amLODipine (NORVASC) 5 MG tablet Take 1 tablet (5 mg total) by mouth daily. 60 tablet 1  . aspirin 81 MG chewable tablet Chew 1 tablet (81 mg total) by mouth daily. 30 tablet 2  . atorvastatin (LIPITOR) 80 MG tablet Take 1 tablet (80 mg total) by mouth daily at 6 PM. 30 tablet 2  . clopidogrel (PLAVIX) 75 MG tablet Take 1 tablet (75 mg total) by mouth daily. 30 tablet 2  . diphenoxylate-atropine (LOMOTIL) 2.5-0.025 MG tablet Take 1 tablet by mouth 4 (four) times daily as needed for diarrhea or loose stools. Take it along with immodium 30 tablet 0  . gabapentin (NEURONTIN) 300 MG capsule Take 2 capsules (600 mg total) by mouth 3 (three) times daily. 180 capsule 3  .  lidocaine-prilocaine (EMLA) cream Apply 1 application topically as needed. Apply to port a cath site 1 hours before treatments 30 g 3  . loperamide (IMODIUM) 2 MG capsule Take 1 capsule (2 mg total) by mouth as needed for diarrhea or loose stools. 30 capsule 0  . magic mouthwash w/lidocaine SOLN Take 5 mLs by mouth 4 (four) times daily as needed for mouth pain. 250 mL 3  . morphine (MS CONTIN) 30 MG 12 hr tablet Take 1 tablet (30 mg total) by mouth every 12 (twelve) hours. 60 tablet 0  . ondansetron (ZOFRAN) 8 MG tablet  Take 1 tablet (8 mg total) by mouth every 8 (eight) hours as needed for nausea or vomiting. 30 tablet 0  . pantoprazole (PROTONIX) 40 MG tablet Take 1 tablet (40 mg total) by mouth daily. 60 tablet 3  . prochlorperazine (COMPAZINE) 10 MG tablet Take 1 tablet (10 mg total) by mouth every 6 (six) hours as needed for nausea or vomiting. 60 tablet 0  . ranitidine (ZANTAC) 75 MG tablet Take 75 mg by mouth daily.    . traZODone (DESYREL) 50 MG tablet Take 50 mg by mouth at bedtime.     No current facility-administered medications for this visit.    PHYSICAL EXAMINATION: ECOG PERFORMANCE STATUS: 0 - Asymptomatic  BP 168/84 mmHg  Pulse 98  Temp(Src) 96.5 F (35.8 C) (Tympanic)  Resp 18  Wt 147 lb 7.8 oz (66.9 kg)  Filed Weights   12/04/15 0927  Weight: 147 lb 7.8 oz (66.9 kg)    GENERAL: Well-nourished well-developed; Alert, no distress and comfortable.   Alone.  EYES: no pallor or icterus OROPHARYNX:Thrush noted in the posterior pharyngeal wall. right tonsillar oral ulceration improving.  NECK: supple, no masses felt LYMPH: Approximately 2 cm bilateral lymph nodes felt LUNGS: clear to auscultation and  No wheeze or crackles HEART/CVS: regular rate & rhythm and no murmurs; No lower extremity edema ABDOMEN:abdomen soft, non-tender and normal bowel sounds Musculoskeletal:no cyanosis of digits and no clubbing  PSYCH: alert & oriented x 3 with fluent speech NEURO: no focal motor/sensory deficits; mild foot drop noted in Left LE/mild weakness in Left UE [significant improvement]  SKIN:  no rashes or significant lesions  LABORATORY DATA:  I have reviewed the data as listed    Component Value Date/Time   NA 136 12/04/2015 0859   K 3.4* 12/04/2015 0859   CL 104 12/04/2015 0859   CO2 26 12/04/2015 0859   GLUCOSE 109* 12/04/2015 0859   BUN 16 12/04/2015 0859   CREATININE 0.60* 12/04/2015 0859   CALCIUM 9.6 12/04/2015 0859   PROT 8.5* 12/04/2015 0859   ALBUMIN 4.1 12/04/2015 0859    AST 20 12/04/2015 0859   ALT 13* 12/04/2015 0859   ALKPHOS 97 12/04/2015 0859   BILITOT 0.3 12/04/2015 0859   GFRNONAA >60 12/04/2015 0859   GFRAA >60 12/04/2015 0859    No results found for: SPEP, UPEP  Lab Results  Component Value Date   WBC 7.9 12/04/2015   NEUTROABS 5.7 12/04/2015   HGB 12.9* 12/04/2015   HCT 37.1* 12/04/2015   MCV 91.6 12/04/2015   PLT 230 12/04/2015      Chemistry      Component Value Date/Time   NA 136 12/04/2015 0859   K 3.4* 12/04/2015 0859   CL 104 12/04/2015 0859   CO2 26 12/04/2015 0859   BUN 16 12/04/2015 0859   CREATININE 0.60* 12/04/2015 0859      Component Value Date/Time  CALCIUM 9.6 12/04/2015 0859   ALKPHOS 97 12/04/2015 0859   AST 20 12/04/2015 0859   ALT 13* 12/04/2015 0859   BILITOT 0.3 12/04/2015 0859        ASSESSMENT & PLAN:   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  PET scan shows-significant improvement of the base of tongue lesion; liver lesions/lesions resolved; improved but still present left cervical lymph node about 2 cm in size.  # Discussed with the patient given the metastatic disease/incurable- in general would recommend indefinite therapy. However, given his significant neuropathy footdrop; and recent acute stroke- I would recommend holding further chemotherapy at this time.  I would recommend starting Keytruda every 3 weeks; starting today.   # Left sided stroke- improved; plan vascular stenting with dr.Schneir.I spoke to Dr.Schneir; plan stenting in mid- July 2017.   # Left lower extremity pain/tingling and numbness- mild footdrop noted-improving. Continue  Neurontin 300 mg 3 times a day.   # Left hip pain improved- continue MS Contin 30 twice a day at this time.  # Follow-up with me in 3 weeks with CBC  CMP/TSH/Keytruda infusion.         Cammie Sickle, MD 12/04/2015 7:16 PM

## 2015-12-24 ENCOUNTER — Inpatient Hospital Stay: Payer: Medicaid Other

## 2015-12-24 ENCOUNTER — Inpatient Hospital Stay: Payer: Medicaid Other | Attending: Internal Medicine | Admitting: Internal Medicine

## 2015-12-24 VITALS — BP 149/75 | HR 98 | Temp 97.3°F | Resp 18 | Wt 147.5 lb

## 2015-12-24 VITALS — BP 174/88 | HR 94 | Resp 18

## 2015-12-24 DIAGNOSIS — C099 Malignant neoplasm of tonsil, unspecified: Secondary | ICD-10-CM

## 2015-12-24 DIAGNOSIS — Z7982 Long term (current) use of aspirin: Secondary | ICD-10-CM | POA: Diagnosis not present

## 2015-12-24 DIAGNOSIS — I10 Essential (primary) hypertension: Secondary | ICD-10-CM | POA: Insufficient documentation

## 2015-12-24 DIAGNOSIS — F17210 Nicotine dependence, cigarettes, uncomplicated: Secondary | ICD-10-CM | POA: Diagnosis not present

## 2015-12-24 DIAGNOSIS — T451X5A Adverse effect of antineoplastic and immunosuppressive drugs, initial encounter: Secondary | ICD-10-CM

## 2015-12-24 DIAGNOSIS — C787 Secondary malignant neoplasm of liver and intrahepatic bile duct: Secondary | ICD-10-CM | POA: Diagnosis not present

## 2015-12-24 DIAGNOSIS — G620 Drug-induced polyneuropathy: Secondary | ICD-10-CM

## 2015-12-24 DIAGNOSIS — Z79899 Other long term (current) drug therapy: Secondary | ICD-10-CM | POA: Diagnosis not present

## 2015-12-24 DIAGNOSIS — Z8673 Personal history of transient ischemic attack (TIA), and cerebral infarction without residual deficits: Secondary | ICD-10-CM | POA: Diagnosis not present

## 2015-12-24 DIAGNOSIS — G629 Polyneuropathy, unspecified: Secondary | ICD-10-CM

## 2015-12-24 DIAGNOSIS — M25552 Pain in left hip: Secondary | ICD-10-CM

## 2015-12-24 DIAGNOSIS — C7951 Secondary malignant neoplasm of bone: Secondary | ICD-10-CM | POA: Diagnosis not present

## 2015-12-24 DIAGNOSIS — G893 Neoplasm related pain (acute) (chronic): Secondary | ICD-10-CM | POA: Insufficient documentation

## 2015-12-24 DIAGNOSIS — M21379 Foot drop, unspecified foot: Secondary | ICD-10-CM | POA: Diagnosis not present

## 2015-12-24 LAB — COMPREHENSIVE METABOLIC PANEL
ALBUMIN: 4.1 g/dL (ref 3.5–5.0)
ALT: 13 U/L — ABNORMAL LOW (ref 17–63)
AST: 21 U/L (ref 15–41)
Alkaline Phosphatase: 94 U/L (ref 38–126)
Anion gap: 8 (ref 5–15)
BUN: 20 mg/dL (ref 6–20)
CHLORIDE: 103 mmol/L (ref 101–111)
CO2: 25 mmol/L (ref 22–32)
Calcium: 9.4 mg/dL (ref 8.9–10.3)
Creatinine, Ser: 0.84 mg/dL (ref 0.61–1.24)
GFR calc Af Amer: >60 mL/min (ref >60)
GLUCOSE: 100 mg/dL — AB (ref 65–99)
POTASSIUM: 3.6 mmol/L (ref 3.5–5.1)
SODIUM: 136 mmol/L (ref 135–145)
Total Bilirubin: 0.5 mg/dL (ref 0.3–1.2)
Total Protein: 8.5 g/dL — ABNORMAL HIGH (ref 6.5–8.1)

## 2015-12-24 LAB — CBC WITH DIFFERENTIAL/PLATELET
Basophils Absolute: 0.1 10*3/uL (ref 0–0.1)
Basophils Relative: 1 %
EOS PCT: 4 %
Eosinophils Absolute: 0.3 10*3/uL (ref 0–0.7)
HCT: 35.8 % — ABNORMAL LOW (ref 40.0–52.0)
Hemoglobin: 12.4 g/dL — ABNORMAL LOW (ref 13.0–18.0)
LYMPHS ABS: 1.2 10*3/uL (ref 1.0–3.6)
LYMPHS PCT: 16 %
MCH: 31.1 pg (ref 26.0–34.0)
MCHC: 34.6 g/dL (ref 32.0–36.0)
MCV: 90.1 fL (ref 80.0–100.0)
Monocytes Absolute: 0.6 10*3/uL (ref 0.2–1.0)
Monocytes Relative: 8 %
Neutro Abs: 5.2 10*3/uL (ref 1.4–6.5)
Neutrophils Relative %: 71 %
PLATELETS: 214 10*3/uL (ref 150–440)
RBC: 3.97 MIL/uL — AB (ref 4.40–5.90)
RDW: 13.7 % (ref 11.5–14.5)
WBC: 7.3 10*3/uL (ref 3.8–10.6)

## 2015-12-24 MED ORDER — MORPHINE SULFATE ER 30 MG PO TBCR: 30.0000 mg | EXTENDED_RELEASE_TABLET | Freq: Two times a day (BID) | ORAL | Status: DC

## 2015-12-24 MED ORDER — SODIUM CHLORIDE 0.9 % IV SOLN
Freq: Once | INTRAVENOUS | Status: AC
  Administered 2015-12-24: 10:00:00 via INTRAVENOUS
  Filled 2015-12-24: qty 1000

## 2015-12-24 MED ORDER — SODIUM CHLORIDE 0.9% FLUSH
10.0000 mL | INTRAVENOUS | Status: DC | PRN
  Administered 2015-12-24 (×2): 10 mL
  Filled 2015-12-24: qty 10

## 2015-12-24 MED ORDER — HEPARIN SOD (PORK) LOCK FLUSH 100 UNIT/ML IV SOLN
500.0000 [IU] | Freq: Once | INTRAVENOUS | Status: AC | PRN
  Administered 2015-12-24: 500 [IU]
  Filled 2015-12-24: qty 5

## 2015-12-24 MED ORDER — SODIUM CHLORIDE 0.9 % IV SOLN
200.0000 mg | Freq: Once | INTRAVENOUS | Status: AC
  Administered 2015-12-24: 200 mg via INTRAVENOUS
  Filled 2015-12-24: qty 8

## 2015-12-24 MED ORDER — HYDROCODONE-ACETAMINOPHEN 5-325 MG PO TABS: 1.0000 | ORAL_TABLET | Freq: Four times a day (QID) | ORAL | Status: DC | PRN

## 2015-12-24 NOTE — Assessment & Plan Note (Addendum)
#   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 Progression noted- clinically.   # proceed with cycle # 2 today. Labs- okay.  #  Left hip pain- recommend adding a breakthrough hydrocodone. Continue MS Contin. new Prescription given. Recommend radiation oncology consultation.  # Left sided stroke- improved; plan vascular stenting with dr.Schneir.I spoke to Dr.Schneir; plan stenting in mid- July 2017. Will refer back.   # Follow-up with me in 3 weeks with CBC CMP/TSH/Keytruda infusion.

## 2015-12-24 NOTE — Progress Notes (Signed)
Patient states about 2 weeks ago he started to have left hip pain.  States it is more painful when he is lying down.  Unable to sleep due to pain. Better when sitting or standing.

## 2015-12-24 NOTE — Progress Notes (Signed)
[place] OFFICE PROGRESS NOTE  Patient Care Team: Kirk Ruths, MD as PCP - General (Internal Medicine) Margaretha Sheffield, MD (Otolaryngology) Katha Cabal, MD (Vascular Surgery)   SUMMARY OF ONCOLOGIC HISTORY: Oncology History   # MARCH 2017- SQUAMOUS CELL CA of Right tongue base/tonsill-bilateral cervical LN/abdominal LN with metastases to liver/ bone; STAGE IV; Feb 2017- Started TPF q3 W x3; May 2017- Partial response.   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  Patient complains of worsening pain in his left hip. Also noted to have swelling of his right left neck nodes. His pain is not well controlled currently MS Contin 30 mg  Patient has noted significant improvement of his left upper extremity strength; he continues to have mild weakness of his left lower extremity [footdrop from his cisplatin/Taxotere].    Otherwise appetite improving. No new lumps or bumps. No sores in the mouth. Gaining weight. No nausea no vomiting diarrhea.  Patient is currently in the assisted living.   REVIEW OF SYSTEMS:  A complete 10 point review of system is done which is negative except mentioned above/history of present illness.   PAST MEDICAL HISTORY :  Past Medical History  Diagnosis Date  . 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 Hypertension   . Tobacco use     PAST SURGICAL HISTORY :    Past Surgical History  Procedure Laterality Date  . Peripheral vascular catheterization N/A 08/18/2015    Procedure: Glori Luis Cath Insertion;  Surgeon: Katha Cabal, MD;  Location: [place] [facility];  Service: Cardiovascular;  Laterality: N/A;    FAMILY HISTORY :   Family History  Problem Relation Age of Onset  . Cancer Mother     metastasized    SOCIAL HISTORY:   Social History  Substance Use Topics  . Smoking status: Current Every Day Smoker -- 1.00 packs/day    Types: Cigarettes  . Smokeless tobacco: Not on file  . Alcohol Use: No     Comment: occasional    ALLERGIES:  has No Known Allergies.  MEDICATIONS:  Current Outpatient Prescriptions  Medication Sig Dispense Refill  . amLODipine (NORVASC) 5 MG tablet Take 1 tablet (5 mg total) by mouth daily. 60 tablet 1  . aspirin 81 MG chewable tablet Chew 1 tablet (81 mg total) by mouth daily. 30 tablet 2  . atorvastatin (LIPITOR) 80 MG tablet Take 1 tablet (80 mg total) by mouth daily at 6 PM. 30 tablet 2  . clopidogrel (PLAVIX) 75 MG tablet Take 1 tablet (75 mg total) by mouth daily. 30 tablet 2  . diphenoxylate-atropine (LOMOTIL) 2.5-0.025 MG tablet Take 1 tablet by mouth 4 (four) times daily as needed for diarrhea or loose stools. Take it along with immodium 30 tablet 0  . gabapentin (NEURONTIN) 300 MG capsule Take 2 capsules (600 mg total) by  mouth 3 (three) times daily. 180 capsule 3  . lidocaine-prilocaine (EMLA) cream Apply 1 application topically as needed. Apply to port a cath site 1 hours before treatments 30 g 3  . loperamide (IMODIUM) 2 MG capsule Take 1 capsule (2 mg total) by mouth as needed for diarrhea or loose stools. 30 capsule 0  . magic mouthwash w/lidocaine SOLN Take 5 mLs by mouth 4 (four) times daily as needed for mouth pain. 250 mL 3  . morphine (MS CONTIN) 30 MG 12 hr tablet Take 1 tablet (30 mg total) by mouth every 12 (twelve) hours. 60 tablet 0  . ondansetron (ZOFRAN) 8 MG tablet Take 1 tablet (8 mg  total) by mouth every 8 (eight) hours as needed for nausea or vomiting. 30 tablet 0  . pantoprazole (PROTONIX) 40 MG tablet Take 1 tablet (40 mg total) by mouth daily. 60 tablet 3  . prochlorperazine (COMPAZINE) 10 MG tablet Take 1 tablet (10 mg total) by mouth every 6 (six) hours as needed for nausea or vomiting. 60 tablet 0  . ranitidine (ZANTAC) 75 MG tablet Take 75 mg by mouth daily.    . traZODone (DESYREL) 50 MG tablet Take 50 mg by mouth at bedtime.    Marland Kitchen HYDROcodone-acetaminophen (NORCO/VICODIN) 5-325 MG tablet Take 1 tablet by mouth every 6 (six) hours as needed for moderate pain. 45 tablet 0   No current facility-administered medications for this visit.    PHYSICAL EXAMINATION: ECOG PERFORMANCE STATUS: 0 - Asymptomatic  BP 149/75 mmHg  Pulse 98  Temp(Src) 97.3 F (36.3 C) (Tympanic)  Resp 18  Wt 147 lb 8 oz (66.906 kg)  Filed Weights   12/24/15 0851  Weight: 147 lb 8 oz (66.906 kg)    GENERAL: Well-nourished well-developed; Alert, no distress and comfortable.   Alone.  EYES: no pallor or icterus OROPHARYNX:Thrush noted in the posterior pharyngeal wall. right tonsillar oral ulceration improving.  NECK: supple, no masses felt LYMPH: Approximately 2 cm bilateral lymph nodes felt bil LUNGS: clear to auscultation and  No wheeze or crackles HEART/CVS: regular rate & rhythm and no murmurs; No lower extremity edema ABDOMEN:abdomen soft, non-tender and normal bowel sounds Musculoskeletal:no cyanosis of digits and no clubbing  PSYCH: alert & oriented x 3 with fluent speech NEURO: no focal motor/sensory deficits; mild foot drop noted in Left LE/mild weakness in Left UE [significant improvement]  SKIN:  no rashes or significant lesions  LABORATORY DATA:  I have reviewed the data as listed    Component Value Date/Time   NA 136 12/24/2015 0825   K 3.6 12/24/2015 0825   CL 103 12/24/2015 0825   CO2 25 12/24/2015 0825   GLUCOSE 100* 12/24/2015 0825   BUN 20 12/24/2015 0825    CREATININE 0.84 12/24/2015 0825   CALCIUM 9.4 12/24/2015 0825   PROT 8.5* 12/24/2015 0825   ALBUMIN 4.1 12/24/2015 0825   AST 21 12/24/2015 0825   ALT 13* 12/24/2015 0825   ALKPHOS 94 12/24/2015 0825   BILITOT 0.5 12/24/2015 0825   GFRNONAA >60 12/24/2015 0825   GFRAA >60 12/24/2015 0825    No results found for: SPEP, UPEP  Lab Results  Component Value Date   WBC 7.3 12/24/2015   NEUTROABS 5.2 12/24/2015   HGB 12.4* 12/24/2015   HCT 35.8* 12/24/2015   MCV 90.1 12/24/2015   PLT 214 12/24/2015      Chemistry      Component Value Date/Time   NA 136 12/24/2015 0825   K 3.6 12/24/2015  0825   CL 103 12/24/2015 0825   CO2 25 12/24/2015 0825   BUN 20 12/24/2015 0825   CREATININE 0.84 12/24/2015 0825      Component Value Date/Time   CALCIUM 9.4 12/24/2015 0825   ALKPHOS 94 12/24/2015 0825   AST 21 12/24/2015 0825   ALT 13* 12/24/2015 0825   BILITOT 0.5 12/24/2015 0825        ASSESSMENT & PLAN:   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 Progression noted- clinically.   # proceed with cycle # 2 today. Labs- okay.  #  Left hip pain- recommend adding a breakthrough hydrocodone. Continue MS Contin. new Prescription given. Recommend radiation oncology consultation.  # Left sided stroke- improved; plan vascular stenting with dr.Schneir.I spoke to Dr.Schneir; plan stenting in mid- July 2017. Will refer back.   # Follow-up with me in 3 weeks with CBC CMP/TSH/Keytruda infusion.       Cammie Sickle, MD 12/24/2015 6:17 PM

## 2015-12-29 ENCOUNTER — Institutional Professional Consult (permissible substitution): Payer: Medicaid Other | Admitting: Radiation Oncology

## 2015-12-30 ENCOUNTER — Encounter: Payer: Self-pay | Admitting: Radiation Oncology

## 2015-12-30 ENCOUNTER — Ambulatory Visit
Admission: RE | Admit: 2015-12-30 | Discharge: 2015-12-30 | Disposition: A | Discharge status: Home / Self Care | Payer: Medicaid Other | Source: Ambulatory Visit | Attending: Radiation Oncology | Admitting: Radiation Oncology

## 2015-12-30 VITALS — BP 146/85 | HR 94 | Temp 95.2°F | Resp 20 | Ht 69.0 in

## 2015-12-30 DIAGNOSIS — C01 Malignant neoplasm of base of tongue: Secondary | ICD-10-CM | POA: Diagnosis not present

## 2015-12-30 DIAGNOSIS — I10 Essential (primary) hypertension: Secondary | ICD-10-CM | POA: Diagnosis not present

## 2015-12-30 DIAGNOSIS — G629 Polyneuropathy, unspecified: Secondary | ICD-10-CM | POA: Diagnosis not present

## 2015-12-30 DIAGNOSIS — Z993 Dependence on wheelchair: Secondary | ICD-10-CM | POA: Diagnosis not present

## 2015-12-30 DIAGNOSIS — Z79899 Other long term (current) drug therapy: Secondary | ICD-10-CM | POA: Diagnosis not present

## 2015-12-30 DIAGNOSIS — Z7982 Long term (current) use of aspirin: Secondary | ICD-10-CM | POA: Insufficient documentation

## 2015-12-30 DIAGNOSIS — Z510 Encounter for antineoplastic radiation therapy: Secondary | ICD-10-CM | POA: Insufficient documentation

## 2015-12-30 DIAGNOSIS — F17210 Nicotine dependence, cigarettes, uncomplicated: Secondary | ICD-10-CM | POA: Insufficient documentation

## 2015-12-30 DIAGNOSIS — C7951 Secondary malignant neoplasm of bone: Secondary | ICD-10-CM | POA: Insufficient documentation

## 2015-12-30 DIAGNOSIS — C787 Secondary malignant neoplasm of liver and intrahepatic bile duct: Secondary | ICD-10-CM | POA: Insufficient documentation

## 2015-12-30 NOTE — Progress Notes (Signed)
Except an outstanding is perfect of Radiation Oncology NEW PATIENT EVALUATION  Name: Ryan Rush  MRN: [id number]  Date:   12/30/2015     DOB: [birth year]   This 55 y.o. male patient presents to the clinic for initial evaluation of head and neck cancer with metastasis to left proximal femur.  REFERRING PHYSICIAN: Kirk Ruths, MD  CHIEF COMPLAINT:  Chief Complaint  Patient presents with  . Cancer    Pt is here for initial consultation of bone metastasis.      DIAGNOSIS: The encounter diagnosis was Bone metastasis ([place]).   PREVIOUS INVESTIGATIONS:  PET CT scan is reviewed Pathology report reviewed Clinical notes reviewed  HPI: patient is a 55 year old male who presented back in March 2017 with stage IV right tongue base squamous cell carcinoma with tonsil and bilateral cervical lymph node involvement. At the time of diagnosis he had liver and bone metastasis. He was started on TPF with partial response. This was switched to [facility] in June. 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 He is always had significant pain in his left hip and PET/CT scan shows a lytic lesion in the proximal left femur. I have been asked to evaluate him for possible palliative radiation therapy to this area today. He is in significant narcotic dependent pain. Is wheelchair-bound at this point.  PLANNED TREATMENT REGIMEN: palliative radiation therapy to left hip  PAST MEDICAL HISTORY:  has a past medical history of Hypertension; Squamous cell carcinoma of base of tongue ([place]); and Tobacco use.    PAST SURGICAL HISTORY:  Past Surgical History:  Procedure Laterality Date  . PERIPHERAL VASCULAR CATHETERIZATION N/A 08/18/2015   Procedure: Glori Luis Cath Insertion;  Surgeon: Katha Cabal, MD;  Location: [place] [facility];  Service: Cardiovascular;  Laterality: N/A;    FAMILY HISTORY: family history includes Cancer in his mother.  SOCIAL  HISTORY:  reports that he has been smoking Cigarettes.  He has been smoking about 1.00 pack per day. He has never used smokeless tobacco. He reports that he does not drink alcohol or use drugs.  ALLERGIES: Review of patient's allergies indicates no known allergies.  MEDICATIONS:  Current Outpatient Prescriptions  Medication Sig Dispense Refill  . amLODipine (NORVASC) 5 MG tablet Take 1 tablet (5 mg total) by mouth daily. 60 tablet 1  . aspirin 81 MG chewable tablet Chew 1 tablet (81 mg total) by mouth daily. 30 tablet 2  . atorvastatin (LIPITOR) 80 MG tablet Take 1 tablet (80 mg total) by mouth daily at 6 PM. 30 tablet 2  . clopidogrel (PLAVIX) 75 MG tablet Take 1 tablet (75 mg total) by mouth daily. 30 tablet 2  . diphenoxylate-atropine (LOMOTIL) 2.5-0.025 MG tablet Take 1 tablet by mouth 4 (four) times daily as needed for diarrhea or loose stools. Take it along with immodium 30 tablet 0  . gabapentin (NEURONTIN) 300 MG capsule Take 2 capsules (600 mg total) by mouth 3 (three) times daily. 180 capsule 3  . HYDROcodone-acetaminophen (NORCO/VICODIN) 5-325 MG tablet Take 1 tablet by mouth every 6 (six) hours as needed for moderate pain. 45 tablet 0  . lidocaine-prilocaine (EMLA) cream Apply 1 application topically as needed. Apply to port a cath site 1 hours before treatments 30 g 3  . loperamide (IMODIUM) 2 MG capsule Take 1 capsule (2 mg total) by mouth as needed for diarrhea or loose stools. 30 capsule 0  . magic mouthwash w/lidocaine SOLN Take 5 mLs by mouth 4 (  four) times daily as needed for mouth pain. 250 mL 3  . morphine (MS CONTIN) 30 MG 12 hr tablet Take 1 tablet (30 mg total) by mouth every 12 (twelve) hours. 60 tablet 0  . ondansetron (ZOFRAN) 8 MG tablet Take 1 tablet (8 mg total) by mouth every 8 (eight) hours as needed for nausea or vomiting. 30 tablet 0  . pantoprazole (PROTONIX) 40 MG tablet Take 1 tablet (40 mg total) by mouth daily. 60 tablet 3  . prochlorperazine (COMPAZINE) 10  MG tablet Take 1 tablet (10 mg total) by mouth every 6 (six) hours as needed for nausea or vomiting. 60 tablet 0  . ranitidine (ZANTAC) 75 MG tablet Take 75 mg by mouth daily.    . traZODone (DESYREL) 50 MG tablet Take 50 mg by mouth at bedtime.     No current facility-administered medications for this encounter.     ECOG PERFORMANCE STATUS:  2 - Symptomatic, <50% confined to bed  REVIEW OF SYSTEMS: patient is having some minor dysphagia. No other significant areas of bone pain.  Patient denies any weight loss, fatigue, weakness, fever, chills or night sweats. Patient denies any loss of vision, blurred vision. Patient denies any ringing  of the ears or hearing loss. No irregular heartbeat. Patient denies heart murmur or history of fainting. Patient denies any chest pain or pain radiating to her upper extremities. Patient denies any shortness of breath, difficulty breathing at night, cough or hemoptysis. Patient denies any swelling in the lower legs. Patient denies any nausea vomiting, vomiting of blood, or coffee ground material in the vomitus. Patient denies any stomach pain. Patient states has had normal bowel movements no significant constipation or diarrhea. Patient denies any dysuria, hematuria or significant nocturia. Patient denies any problems walking, swelling in the joints or loss of balance. Patient denies any skin changes, loss of hair or loss of weight. Patient denies any excessive worrying or anxiety or significant depression. Patient denies any problems with insomnia. Patient denies excessive thirst, polyuria, polydipsia. Patient denies any swollen glands, patient denies easy bruising or easy bleeding. Patient denies any recent infections, allergies or URI. Patient "s visual fields have not changed significantly in recent time.    PHYSICAL EXAM: BP (!) 146/85 (BP Location: Left Arm, Patient Position: Sitting)   Pulse 94   Temp (!) 95.2 F (35.1 C)   Resp 20   Ht 5\' 9"  (1.753 m)   Well-developed wheelchair-bound male in NAD. Range of motion of the left lower extremity doesn't elicit significant pain. He has bulky adenopathy in his cervical chains bilaterally. Oral cavity is clear no significant disease in his upper airway is noted. Well-developed well-nourished patient in NAD. HEENT reveals PERLA, EOMI, discs not visualized.  Oral cavity is clear. No oral mucosal lesions are identified. Neck is clear without evidence of cervical or supraclavicular adenopathy. Lungs are clear to A&P. Cardiac examination is essentially unremarkable with regular rate and rhythm without murmur rub or thrill. Abdomen is benign with no organomegaly or masses noted. Motor sensory and DTR levels are equal and symmetric in the upper and lower extremities. Cranial nerves II through XII are grossly intact. Proprioception is intact. No peripheral adenopathy or edema is identified. No motor or sensory levels are noted. Crude visual fields are within normal range.  LABORATORY DATA: pathology report reviewed    RADIOLOGY RESULTS:previous PET CT scan reviewed   IMPRESSION: stage IV head and neck cancer with metastatic disease to his left proximal femur in 55 year old  male  PLAN: this time I to go ahead with palliative radiation therapy to his left hip. Would plan on delivering 3000 cGy in 10 fractions. I have personally set up and ordered CT simulation for tomorrow and start his treatments early next week. Risks and benefits of treatment including skin reaction fatigue possible alteration of blood counts all were explained in detail to the patient. He seems to comprehend my treatment plan well.  I would like to take this opportunity to thank you for allowing me to participate in the care of your patient.Armstead Peaks., MD

## 2015-12-31 ENCOUNTER — Ambulatory Visit
Admission: RE | Admit: 2015-12-31 | Discharge: 2015-12-31 | Disposition: A | Discharge status: Home / Self Care | Payer: Medicaid Other | Source: Ambulatory Visit | Attending: Radiation Oncology | Admitting: Radiation Oncology

## 2015-12-31 DIAGNOSIS — Z510 Encounter for antineoplastic radiation therapy: Secondary | ICD-10-CM | POA: Diagnosis not present

## 2016-01-04 ENCOUNTER — Other Ambulatory Visit: Payer: Self-pay | Admitting: Vascular Surgery

## 2016-01-05 ENCOUNTER — Ambulatory Visit
Admission: RE | Admit: 2016-01-05 | Discharge: 2016-01-05 | Disposition: A | Discharge status: Home / Self Care | Payer: Medicaid Other | Source: Ambulatory Visit | Attending: Radiation Oncology | Admitting: Radiation Oncology

## 2016-01-05 DIAGNOSIS — Z510 Encounter for antineoplastic radiation therapy: Secondary | ICD-10-CM | POA: Diagnosis not present

## 2016-01-06 ENCOUNTER — Ambulatory Visit
Admission: RE | Admit: 2016-01-06 | Discharge: 2016-01-06 | Disposition: A | Discharge status: Home / Self Care | Payer: Medicaid Other | Source: Ambulatory Visit | Attending: Radiation Oncology | Admitting: Radiation Oncology

## 2016-01-06 DIAGNOSIS — Z510 Encounter for antineoplastic radiation therapy: Secondary | ICD-10-CM | POA: Diagnosis not present

## 2016-01-07 ENCOUNTER — Ambulatory Visit
Admission: RE | Admit: 2016-01-07 | Discharge: 2016-01-07 | Disposition: A | Discharge status: Home / Self Care | Payer: Medicaid Other | Source: Ambulatory Visit | Attending: Radiation Oncology | Admitting: Radiation Oncology

## 2016-01-07 DIAGNOSIS — Z510 Encounter for antineoplastic radiation therapy: Secondary | ICD-10-CM | POA: Diagnosis not present

## 2016-01-08 ENCOUNTER — Ambulatory Visit
Admission: RE | Admit: 2016-01-08 | Discharge: 2016-01-08 | Disposition: A | Discharge status: Home / Self Care | Payer: Medicaid Other | Source: Ambulatory Visit | Attending: Radiation Oncology | Admitting: Radiation Oncology

## 2016-01-08 DIAGNOSIS — Z510 Encounter for antineoplastic radiation therapy: Secondary | ICD-10-CM | POA: Diagnosis not present

## 2016-01-11 ENCOUNTER — Ambulatory Visit
Admission: RE | Admit: 2016-01-11 | Discharge: 2016-01-11 | Disposition: A | Discharge status: Home / Self Care | Payer: Medicaid Other | Source: Ambulatory Visit | Attending: Radiation Oncology | Admitting: Radiation Oncology

## 2016-01-11 DIAGNOSIS — Z510 Encounter for antineoplastic radiation therapy: Secondary | ICD-10-CM | POA: Diagnosis not present

## 2016-01-12 ENCOUNTER — Ambulatory Visit
Admission: RE | Admit: 2016-01-12 | Discharge: 2016-01-12 | Disposition: A | Discharge status: Home / Self Care | Payer: Medicaid Other | Source: Ambulatory Visit | Attending: Radiation Oncology | Admitting: Radiation Oncology

## 2016-01-12 DIAGNOSIS — Z510 Encounter for antineoplastic radiation therapy: Secondary | ICD-10-CM | POA: Diagnosis not present

## 2016-01-13 ENCOUNTER — Ambulatory Visit
Admission: RE | Admit: 2016-01-13 | Discharge: 2016-01-13 | Disposition: A | Discharge status: Home / Self Care | Payer: Medicaid Other | Source: Ambulatory Visit | Attending: Radiation Oncology | Admitting: Radiation Oncology

## 2016-01-13 DIAGNOSIS — Z510 Encounter for antineoplastic radiation therapy: Secondary | ICD-10-CM | POA: Diagnosis not present

## 2016-01-14 ENCOUNTER — Inpatient Hospital Stay: Payer: Medicaid Other

## 2016-01-14 ENCOUNTER — Ambulatory Visit
Admission: RE | Admit: 2016-01-14 | Discharge: 2016-01-14 | Disposition: A | Discharge status: Home / Self Care | Payer: Medicaid Other | Source: Ambulatory Visit | Attending: Radiation Oncology | Admitting: Radiation Oncology

## 2016-01-14 ENCOUNTER — Inpatient Hospital Stay (HOSPITAL_BASED_OUTPATIENT_CLINIC_OR_DEPARTMENT_OTHER): Payer: Medicaid Other | Admitting: Internal Medicine

## 2016-01-14 ENCOUNTER — Inpatient Hospital Stay: Payer: Medicaid Other | Attending: Internal Medicine

## 2016-01-14 VITALS — BP 134/78 | HR 90 | Resp 18

## 2016-01-14 VITALS — BP 142/81 | HR 106 | Temp 97.6°F | Resp 18 | Wt 136.1 lb

## 2016-01-14 DIAGNOSIS — G629 Polyneuropathy, unspecified: Secondary | ICD-10-CM

## 2016-01-14 DIAGNOSIS — C099 Malignant neoplasm of tonsil, unspecified: Secondary | ICD-10-CM | POA: Diagnosis not present

## 2016-01-14 DIAGNOSIS — Z87820 Personal history of traumatic brain injury: Secondary | ICD-10-CM | POA: Diagnosis not present

## 2016-01-14 DIAGNOSIS — I10 Essential (primary) hypertension: Secondary | ICD-10-CM | POA: Insufficient documentation

## 2016-01-14 DIAGNOSIS — C7951 Secondary malignant neoplasm of bone: Secondary | ICD-10-CM | POA: Diagnosis not present

## 2016-01-14 DIAGNOSIS — Z7982 Long term (current) use of aspirin: Secondary | ICD-10-CM | POA: Insufficient documentation

## 2016-01-14 DIAGNOSIS — C787 Secondary malignant neoplasm of liver and intrahepatic bile duct: Secondary | ICD-10-CM

## 2016-01-14 DIAGNOSIS — Z510 Encounter for antineoplastic radiation therapy: Secondary | ICD-10-CM | POA: Diagnosis not present

## 2016-01-14 DIAGNOSIS — G893 Neoplasm related pain (acute) (chronic): Secondary | ICD-10-CM

## 2016-01-14 DIAGNOSIS — M25552 Pain in left hip: Secondary | ICD-10-CM | POA: Insufficient documentation

## 2016-01-14 DIAGNOSIS — F17210 Nicotine dependence, cigarettes, uncomplicated: Secondary | ICD-10-CM | POA: Insufficient documentation

## 2016-01-14 DIAGNOSIS — Z5111 Encounter for antineoplastic chemotherapy: Secondary | ICD-10-CM | POA: Diagnosis present

## 2016-01-14 DIAGNOSIS — Z79899 Other long term (current) drug therapy: Secondary | ICD-10-CM | POA: Insufficient documentation

## 2016-01-14 DIAGNOSIS — C01 Malignant neoplasm of base of tongue: Secondary | ICD-10-CM | POA: Insufficient documentation

## 2016-01-14 DIAGNOSIS — C7989 Secondary malignant neoplasm of other specified sites: Secondary | ICD-10-CM

## 2016-01-14 DIAGNOSIS — T451X5A Adverse effect of antineoplastic and immunosuppressive drugs, initial encounter: Secondary | ICD-10-CM

## 2016-01-14 DIAGNOSIS — G620 Drug-induced polyneuropathy: Secondary | ICD-10-CM

## 2016-01-14 LAB — CBC WITH DIFFERENTIAL/PLATELET
BASOS ABS: 0 10*3/uL (ref 0–0.1)
BASOS PCT: 0 %
Eosinophils Absolute: 0 10*3/uL (ref 0–0.7)
Eosinophils Relative: 0 %
HEMATOCRIT: 34.7 % — AB (ref 40.0–52.0)
HEMOGLOBIN: 12.3 g/dL — AB (ref 13.0–18.0)
Lymphocytes Relative: 6 %
Lymphs Abs: 0.4 10*3/uL — ABNORMAL LOW (ref 1.0–3.6)
MCH: 30.5 pg (ref 26.0–34.0)
MCHC: 35.3 g/dL (ref 32.0–36.0)
MCV: 86.4 fL (ref 80.0–100.0)
Monocytes Absolute: 0.3 10*3/uL (ref 0.2–1.0)
Monocytes Relative: 5 %
NEUTROS ABS: 5.7 10*3/uL (ref 1.4–6.5)
NEUTROS PCT: 89 %
Platelets: 204 10*3/uL (ref 150–440)
RBC: 4.01 MIL/uL — ABNORMAL LOW (ref 4.40–5.90)
RDW: 13 % (ref 11.5–14.5)
WBC: 6.5 10*3/uL (ref 3.8–10.6)

## 2016-01-14 LAB — COMPREHENSIVE METABOLIC PANEL
ALBUMIN: 3.6 g/dL (ref 3.5–5.0)
ALT: 16 U/L — ABNORMAL LOW (ref 17–63)
ANION GAP: 10 (ref 5–15)
AST: 30 U/L (ref 15–41)
Alkaline Phosphatase: 83 U/L (ref 38–126)
BILIRUBIN TOTAL: 0.6 mg/dL (ref 0.3–1.2)
BUN: 15 mg/dL (ref 6–20)
CALCIUM: 9.2 mg/dL (ref 8.9–10.3)
CO2: 23 mmol/L (ref 22–32)
Chloride: 96 mmol/L — ABNORMAL LOW (ref 101–111)
Creatinine, Ser: 0.6 mg/dL — ABNORMAL LOW (ref 0.61–1.24)
GFR calc non Af Amer: >60 mL/min (ref >60)
Glucose, Bld: 127 mg/dL — ABNORMAL HIGH (ref 65–99)
Potassium: 3.6 mmol/L (ref 3.5–5.1)
Sodium: 129 mmol/L — ABNORMAL LOW (ref 135–145)
TOTAL PROTEIN: 8.4 g/dL — AB (ref 6.5–8.1)

## 2016-01-14 MED ORDER — SODIUM CHLORIDE 0.9% FLUSH
10.0000 mL | Freq: Once | INTRAVENOUS | Status: AC
  Administered 2016-01-14: 10 mL via INTRAVENOUS
  Filled 2016-01-14: qty 10

## 2016-01-14 MED ORDER — SODIUM CHLORIDE 0.9 % IV SOLN
200.0000 mg | Freq: Once | INTRAVENOUS | Status: AC
  Administered 2016-01-14: 200 mg via INTRAVENOUS
  Filled 2016-01-14: qty 8

## 2016-01-14 MED ORDER — SODIUM CHLORIDE 0.9 % IV SOLN
Freq: Once | INTRAVENOUS | Status: AC
  Administered 2016-01-14: 10:00:00 via INTRAVENOUS
  Filled 2016-01-14: qty 1000

## 2016-01-14 MED ORDER — HEPARIN SOD (PORK) LOCK FLUSH 100 UNIT/ML IV SOLN
500.0000 [IU] | Freq: Once | INTRAVENOUS | Status: AC
  Administered 2016-01-14: 500 [IU] via INTRAVENOUS

## 2016-01-14 MED ORDER — HEPARIN SOD (PORK) LOCK FLUSH 100 UNIT/ML IV SOLN
INTRAVENOUS | Status: AC
  Filled 2016-01-14: qty 5

## 2016-01-14 MED ORDER — HYDROCODONE-ACETAMINOPHEN 5-325 MG PO TABS: 1.0000 | ORAL_TABLET | ORAL | 0 refills | Status: DC | PRN

## 2016-01-14 MED ORDER — MORPHINE SULFATE ER 30 MG PO TBCR: 30.0000 mg | EXTENDED_RELEASE_TABLET | Freq: Three times a day (TID) | ORAL | 0 refills | Status: DC

## 2016-01-14 NOTE — Progress Notes (Signed)
Patient states his pain medication is not covering his pain.  States after about 3 hours his pain is intense.

## 2016-01-14 NOTE — Assessment & Plan Note (Addendum)
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 Progression noted- clinically.   # proceed with Keytruda cycle # 3 today. Labs- okay. Check CT scan neck chest abdomen pelvis. I would recommend starting chemotherapy with carbo-5-FU- cetuximab starting 3 weeks. We'll try to avoid taxine given neuropathy.   #  Left hip pain- worsening; recommend hydrocodone every 4h;  Increase MS  To 30 TID. RT until 15th. Start X-geva.   # Left sided stroke- improved; plan vascular stenting with dr.Schneir; next Aug 16th.   # Follow-up with me in 3 weeks with CBC CMP/CT scans in 2 weeks.

## 2016-01-14 NOTE — Progress Notes (Signed)
[facility] OFFICE PROGRESS NOTE  Patient Care Team: Marshall W Anderson, MD as PCP - General (Internal Medicine) Paul Juengel, MD (Otolaryngology) Gregory G Schnier, MD (Vascular Surgery)   SUMMARY OF ONCOLOGIC HISTORY: Oncology History   # MARCH 2017- SQUAMOUS CELL CA of Right tongue base/tonsill-bilateral cervical LN/abdominal LN with metastases to liver/ bone; STAGE IV; Feb 2017- Started TPF q3 W x3; May 2017- Partial response.   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  Patient is currently getting radiation for his left hip pain. No significant improvement noted. Also noted to have worsening swelling of his right sided neck lymph nodes. His pain is not well controlled currently MS Contin 30 mg twice a day with hydrocodone breakthrough pain medication.  Patient has noted significant improvement of his left upper extremity strength; he continues to have mild weakness of his left lower extremity [footdrop from his cisplatin/Taxotere].    Otherwise appetite improving. No new lumps or bumps. No sores in the mouth. Gaining weight. No nausea no vomiting diarrhea.  Patient is currently in the assisted living.   REVIEW OF SYSTEMS:  A complete 10 point review of system is done which is negative except mentioned above/history of present illness.   PAST MEDICAL HISTORY :  Past Medical History:  Diagnosis Date  . Hypertension   . Squamous  cell carcinoma of base of tongue (HCC)    stage 4 with liver and bone mets  . Tobacco use     PAST SURGICAL HISTORY :   Past Surgical History:  Procedure Laterality Date  . PERIPHERAL VASCULAR CATHETERIZATION N/A 08/18/2015   Procedure: Porta Cath Insertion;  Surgeon: Gregory G Schnier, MD;  Location: [facility] IN[facility];  Service: Cardiovascular;  Laterality: N/A;    FAMILY HISTORY :   Family History  Problem Relation Age of Onset  . Cancer Mother     metastasized    SOCIAL HISTORY:   Social History  Substance Use Topics  . Smoking status: Current Every Day Smoker    Packs/day: 1.00    Types: Cigarettes  . Smokeless tobacco: Never Used  . Alcohol use No     Comment: occasional    ALLERGIES:  has No Known Allergies.  MEDICATIONS:  Current Outpatient Prescriptions  Medication Sig Dispense Refill  . amLODipine (NORVASC) 5 MG tablet Take 1 tablet (5 mg total) by mouth daily. 60 tablet 1  . aspirin 81 MG chewable tablet Chew 1 tablet (81 mg total) by mouth daily. 30 tablet 2  . atorvastatin (LIPITOR) 80 MG tablet Take 1 tablet (80 mg total) by mouth daily at 6 PM. 30 tablet 2  . clopidogrel (PLAVIX) 75 MG tablet Take 1 tablet (75 mg total) by mouth daily. 30 tablet 2  . diphenoxylate-atropine (LOMOTIL) 2.5-0.025 MG tablet Take 1 tablet by mouth 4 (four) times daily as needed for diarrhea or loose stools. Take it along   with immodium 30 tablet 0  . gabapentin (NEURONTIN) 300 MG capsule Take 2 capsules (600 mg total) by mouth 3 (three) times daily. 180 capsule 3  . HYDROcodone-acetaminophen (NORCO/VICODIN) 5-325 MG tablet Take 1 tablet by mouth every 4 (four) hours as needed for moderate pain. (Patient taking differently: Take 1 tablet by mouth every 6 (six) hours as needed for moderate pain. ) 120 tablet 0  . lidocaine-prilocaine (EMLA) cream Apply 1 application topically as needed. Apply to port a cath site 1 hours before treatments 30 g 3  . loperamide (IMODIUM) 2 MG capsule  Take 1 capsule (2 mg total) by mouth as needed for diarrhea or loose stools. 30 capsule 0  . magic mouthwash w/lidocaine SOLN Take 5 mLs by mouth 4 (four) times daily as needed for mouth pain. 250 mL 3  . morphine (MS CONTIN) 30 MG 12 hr tablet Take 1 tablet (30 mg total) by mouth every 8 (eight) hours. (Patient taking differently: Take 30 mg by mouth every 12 (twelve) hours. ) 65 tablet 0  . ondansetron (ZOFRAN) 8 MG tablet Take 1 tablet (8 mg total) by mouth every 8 (eight) hours as needed for nausea or vomiting. 30 tablet 0  . pantoprazole (PROTONIX) 40 MG tablet Take 1 tablet (40 mg total) by mouth daily. 60 tablet 3  . prochlorperazine (COMPAZINE) 10 MG tablet Take 1 tablet (10 mg total) by mouth every 6 (six) hours as needed for nausea or vomiting. 60 tablet 0  . ranitidine (ZANTAC) 75 MG tablet Take 75 mg by mouth daily.    . traZODone (DESYREL) 50 MG tablet Take 50 mg by mouth at bedtime.    . baclofen (LIORESAL) 10 MG tablet Take 15 mg by mouth 3 (three) times daily.     No current facility-administered medications for this visit.     PHYSICAL EXAMINATION: ECOG PERFORMANCE STATUS: 0 - Asymptomatic  BP (!) 142/81 (BP Location: Left Arm, Patient Position: Sitting)   Pulse (!) 106   Temp 97.6 F (36.4 C) (Tympanic)   Resp 18   Wt 136 lb 2 oz (61.7 kg)   BMI 20.10 kg/m   Filed Weights   01/14/16 0906  Weight: 136 lb 2 oz (61.7 kg)    GENERAL: Well-nourished well-developed; Alert, no distress and comfortable.   Alone.  EYES: no pallor or icterus OROPHARYNX: Again noted to have right tonsillar oral ulceration.  NECK: supple, LYMPH: Approximately 2-3 cm bilateral lymph nodes felt bil [increasing in size] LUNGS: clear to auscultation and  No wheeze or crackles HEART/CVS: regular rate & rhythm and no murmurs; No lower extremity edema ABDOMEN:abdomen soft, non-tender and normal bowel sounds Musculoskeletal:no cyanosis of digits and no clubbing  PSYCH: alert & oriented x 3 with  fluent speech NEURO: no focal motor/sensory deficits; mild foot drop noted in Left LE/mild weakness in Left UE [significant improvement]  SKIN:  no rashes or significant lesions  LABORATORY DATA:  I have reviewed the data as listed    Component Value Date/Time   NA 129 (L) 01/14/2016 0844   K 3.6 01/14/2016 0844   CL 96 (L) 01/14/2016 0844   CO2 23 01/14/2016 0844   GLUCOSE 127 (H) 01/14/2016 0844   BUN 15 01/14/2016 0844   CREATININE 0.60 (L) 01/14/2016 0844   CALCIUM 9.2 01/14/2016 0844   PROT 8.4 (H) 01/14/2016 0844   ALBUMIN 3.6 01/14/2016 0844   AST 30 01/14/2016 0844   ALT 16 (L) 01/14/2016 0844   ALKPHOS 83 01/14/2016   0844   BILITOT 0.6 01/14/2016 0844   GFRNONAA >60 01/14/2016 0844   GFRAA >60 01/14/2016 0844    No results found for: SPEP, UPEP  Lab Results  Component Value Date   WBC 6.5 01/14/2016   NEUTROABS 5.7 01/14/2016   HGB 12.3 (L) 01/14/2016   HCT 34.7 (L) 01/14/2016   MCV 86.4 01/14/2016   PLT 204 01/14/2016      Chemistry      Component Value Date/Time   NA 129 (L) 01/14/2016 0844   K 3.6 01/14/2016 0844   CL 96 (L) 01/14/2016 0844   CO2 23 01/14/2016 0844   BUN 15 01/14/2016 0844   CREATININE 0.60 (L) 01/14/2016 0844      Component Value Date/Time   CALCIUM 9.2 01/14/2016 0844   ALKPHOS 83 01/14/2016 0844   AST 30 01/14/2016 0844   ALT 16 (L) 01/14/2016 0844   BILITOT 0.6 01/14/2016 0844        ASSESSMENT & PLAN:   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 Progression noted- clinically.   # proceed with Keytruda cycle # 3 today. Labs- okay. Check CT scan neck chest abdomen pelvis. I would recommend starting chemotherapy with carbo-5-FU- cetuximab starting 3 weeks. We'll try to avoid taxine given neuropathy.   #  Left hip pain- worsening; recommend hydrocodone every 4h;  Increase MS  To 30 TID. RT until 15th. Start  X-geva.   # Left sided stroke- improved; plan vascular stenting with dr.Schneir; next Aug 16th.   # Follow-up with me in 3 weeks with CBC CMP/CT scans in 2 weeks.       Cammie Sickle, MD 01/15/2016 8:13 AM

## 2016-01-15 ENCOUNTER — Ambulatory Visit
Admission: RE | Admit: 2016-01-15 | Discharge: 2016-01-15 | Disposition: A | Discharge status: Home / Self Care | Payer: Medicaid Other | Source: Ambulatory Visit | Attending: Radiation Oncology | Admitting: Radiation Oncology

## 2016-01-15 DIAGNOSIS — Z510 Encounter for antineoplastic radiation therapy: Secondary | ICD-10-CM | POA: Diagnosis not present

## 2016-01-15 DIAGNOSIS — C7951 Secondary malignant neoplasm of bone: Secondary | ICD-10-CM | POA: Insufficient documentation

## 2016-01-18 ENCOUNTER — Ambulatory Visit
Admission: RE | Admit: 2016-01-18 | Discharge: 2016-01-18 | Disposition: A | Discharge status: Home / Self Care | Payer: Medicaid Other | Source: Ambulatory Visit | Attending: Radiation Oncology | Admitting: Radiation Oncology

## 2016-01-18 DIAGNOSIS — Z510 Encounter for antineoplastic radiation therapy: Secondary | ICD-10-CM | POA: Diagnosis not present

## 2016-01-19 ENCOUNTER — Ambulatory Visit
Admission: RE | Admit: 2016-01-19 | Discharge: 2016-01-19 | Disposition: A | Discharge status: Home / Self Care | Payer: Medicaid Other | Source: Ambulatory Visit | Attending: Radiation Oncology | Admitting: Radiation Oncology

## 2016-01-19 DIAGNOSIS — Z510 Encounter for antineoplastic radiation therapy: Secondary | ICD-10-CM | POA: Diagnosis not present

## 2016-01-20 ENCOUNTER — Encounter: Payer: Self-pay | Admitting: Anesthesiology

## 2016-01-20 ENCOUNTER — Inpatient Hospital Stay
Admission: RE | Admit: 2016-01-20 | Discharge: 2016-01-21 | DRG: 038 | Disposition: A | Discharge status: Home / Self Care | Payer: Medicaid Other | Source: Ambulatory Visit | Attending: Vascular Surgery | Admitting: Vascular Surgery

## 2016-01-20 ENCOUNTER — Encounter: Payer: Self-pay | Admitting: *Deleted

## 2016-01-20 ENCOUNTER — Encounter
Admission: RE | Disposition: A | Discharge status: Home / Self Care | Payer: Self-pay | Source: Ambulatory Visit | Attending: Vascular Surgery

## 2016-01-20 DIAGNOSIS — I6521 Occlusion and stenosis of right carotid artery: Principal | ICD-10-CM | POA: Diagnosis present

## 2016-01-20 DIAGNOSIS — I10 Essential (primary) hypertension: Secondary | ICD-10-CM | POA: Diagnosis present

## 2016-01-20 DIAGNOSIS — I2510 Atherosclerotic heart disease of native coronary artery without angina pectoris: Secondary | ICD-10-CM | POA: Diagnosis present

## 2016-01-20 DIAGNOSIS — C787 Secondary malignant neoplasm of liver and intrahepatic bile duct: Secondary | ICD-10-CM | POA: Diagnosis present

## 2016-01-20 DIAGNOSIS — Z79899 Other long term (current) drug therapy: Secondary | ICD-10-CM

## 2016-01-20 DIAGNOSIS — C029 Malignant neoplasm of tongue, unspecified: Secondary | ICD-10-CM | POA: Diagnosis present

## 2016-01-20 DIAGNOSIS — Z7982 Long term (current) use of aspirin: Secondary | ICD-10-CM

## 2016-01-20 DIAGNOSIS — Z7902 Long term (current) use of antithrombotics/antiplatelets: Secondary | ICD-10-CM

## 2016-01-20 DIAGNOSIS — C7951 Secondary malignant neoplasm of bone: Secondary | ICD-10-CM | POA: Diagnosis present

## 2016-01-20 DIAGNOSIS — I69334 Monoplegia of upper limb following cerebral infarction affecting left non-dominant side: Secondary | ICD-10-CM

## 2016-01-20 DIAGNOSIS — I6529 Occlusion and stenosis of unspecified carotid artery: Secondary | ICD-10-CM | POA: Diagnosis present

## 2016-01-20 HISTORY — PX: PERIPHERAL VASCULAR CATHETERIZATION: SHX172C

## 2016-01-20 LAB — TYPE AND SCREEN
ABO/RH(D): A POS
ANTIBODY SCREEN: NEGATIVE

## 2016-01-20 LAB — PROTIME-INR
INR: 0.89
PROTHROMBIN TIME: 12 s (ref 11.4–15.2)

## 2016-01-20 LAB — APTT: aPTT: 40 seconds — ABNORMAL HIGH (ref 24–36)

## 2016-01-20 LAB — GLUCOSE, CAPILLARY: Glucose-Capillary: 106 mg/dL — ABNORMAL HIGH (ref 65–99)

## 2016-01-20 LAB — MRSA PCR SCREENING: MRSA BY PCR: NEGATIVE

## 2016-01-20 SURGERY — CAROTID PTA/STENT INTERVENTION
Anesthesia: Moderate Sedation | Laterality: Right

## 2016-01-20 MED ORDER — TRAZODONE HCL 50 MG PO TABS
50.0000 mg | ORAL_TABLET | Freq: Every day | ORAL | Status: DC
  Administered 2016-01-20: 50 mg via ORAL
  Filled 2016-01-20: qty 1

## 2016-01-20 MED ORDER — LABETALOL HCL 5 MG/ML IV SOLN
10.0000 mg | INTRAVENOUS | Status: DC | PRN
  Administered 2016-01-20 (×2): 10 mg via INTRAVENOUS

## 2016-01-20 MED ORDER — GUAIFENESIN-DM 100-10 MG/5ML PO SYRP: 15.0000 mL | ORAL_SOLUTION | ORAL | Status: DC | PRN

## 2016-01-20 MED ORDER — HEPARIN SODIUM (PORCINE) 1000 UNIT/ML IJ SOLN
INTRAMUSCULAR | Status: DC | PRN
  Administered 2016-01-20: 5000 [IU] via INTRAVENOUS
  Administered 2016-01-20 (×2): 3000 [IU] via INTRAVENOUS

## 2016-01-20 MED ORDER — DEXTROSE 5 % IV SOLN
1.5000 g | Freq: Two times a day (BID) | INTRAVENOUS | Status: AC
  Administered 2016-01-20 – 2016-01-21 (×2): 1.5 g via INTRAVENOUS
  Filled 2016-01-20 (×2): qty 1.5

## 2016-01-20 MED ORDER — SODIUM CHLORIDE 0.9 % IV SOLN
500.0000 mL | Freq: Once | INTRAVENOUS | Status: AC | PRN
  Administered 2016-01-20: 500 mL via INTRAVENOUS

## 2016-01-20 MED ORDER — HEPARIN SODIUM (PORCINE) 1000 UNIT/ML IJ SOLN
INTRAMUSCULAR | Status: AC
  Filled 2016-01-20: qty 1

## 2016-01-20 MED ORDER — ACETAMINOPHEN 325 MG PO TABS
325.0000 mg | ORAL_TABLET | ORAL | Status: DC | PRN
  Administered 2016-01-20: 650 mg via ORAL
  Filled 2016-01-20: qty 2

## 2016-01-20 MED ORDER — BIVALIRUDIN BOLUS VIA INFUSION: 0.0500 mg/kg | Freq: Once | INTRAVENOUS | Status: DC

## 2016-01-20 MED ORDER — CETYLPYRIDINIUM CHLORIDE 0.05 % MT LIQD: 7.0000 mL | Freq: Two times a day (BID) | OROMUCOSAL | Status: DC

## 2016-01-20 MED ORDER — IOPAMIDOL (ISOVUE-300) INJECTION 61%
INTRAVENOUS | Status: DC | PRN
  Administered 2016-01-20: 85 mL via INTRA_ARTERIAL

## 2016-01-20 MED ORDER — BIVALIRUDIN 250 MG IV SOLR
INTRAVENOUS | Status: AC
  Filled 2016-01-20: qty 250

## 2016-01-20 MED ORDER — DIPHENOXYLATE-ATROPINE 2.5-0.025 MG PO TABS: 1.0000 | ORAL_TABLET | Freq: Four times a day (QID) | ORAL | Status: DC | PRN

## 2016-01-20 MED ORDER — FENTANYL CITRATE (PF) 100 MCG/2ML IJ SOLN
INTRAMUSCULAR | Status: AC
  Filled 2016-01-20: qty 4

## 2016-01-20 MED ORDER — DOPAMINE-DEXTROSE 3.2-5 MG/ML-% IV SOLN: 3.0000 ug/kg/min | INTRAVENOUS | Status: DC

## 2016-01-20 MED ORDER — LOPERAMIDE HCL 2 MG PO CAPS
2.0000 mg | ORAL_CAPSULE | ORAL | Status: DC | PRN
Start: 2016-01-20 — End: 2016-01-21

## 2016-01-20 MED ORDER — MIDAZOLAM HCL 2 MG/2ML IJ SOLN
INTRAMUSCULAR | Status: DC | PRN
  Administered 2016-01-20: 1 mg via INTRAVENOUS
  Administered 2016-01-20 (×2): 2 mg via INTRAVENOUS

## 2016-01-20 MED ORDER — PROCHLORPERAZINE MALEATE 10 MG PO TABS: 10.0000 mg | ORAL_TABLET | Freq: Four times a day (QID) | ORAL | Status: DC | PRN

## 2016-01-20 MED ORDER — HEPARIN (PORCINE) IN NACL 2-0.9 UNIT/ML-% IJ SOLN
INTRAMUSCULAR | Status: AC
  Filled 2016-01-20: qty 1000

## 2016-01-20 MED ORDER — METOPROLOL TARTRATE 5 MG/5ML IV SOLN: 2.0000 mg | INTRAVENOUS | Status: DC | PRN

## 2016-01-20 MED ORDER — AMLODIPINE BESYLATE 5 MG PO TABS
5.0000 mg | ORAL_TABLET | Freq: Every day | ORAL | Status: DC
  Administered 2016-01-20 – 2016-01-21 (×2): 5 mg via ORAL
  Filled 2016-01-20 (×2): qty 1

## 2016-01-20 MED ORDER — MAGIC MOUTHWASH W/LIDOCAINE
5.0000 mL | Freq: Four times a day (QID) | ORAL | Status: DC | PRN
  Filled 2016-01-20: qty 5

## 2016-01-20 MED ORDER — HYDRALAZINE HCL 20 MG/ML IJ SOLN: 5.0000 mg | INTRAMUSCULAR | Status: DC | PRN

## 2016-01-20 MED ORDER — SODIUM CHLORIDE 0.9 % IV SOLN: 0.1500 mg/kg/h | INTRAVENOUS | Status: DC

## 2016-01-20 MED ORDER — CHLORHEXIDINE GLUCONATE CLOTH 2 % EX PADS: 6.0000 | MEDICATED_PAD | Freq: Once | CUTANEOUS | Status: DC

## 2016-01-20 MED ORDER — HYDROCODONE-ACETAMINOPHEN 5-325 MG PO TABS
1.0000 | ORAL_TABLET | ORAL | Status: DC | PRN
  Administered 2016-01-21: 1 via ORAL
  Filled 2016-01-20: qty 1

## 2016-01-20 MED ORDER — FENTANYL CITRATE (PF) 100 MCG/2ML IJ SOLN
INTRAMUSCULAR | Status: DC | PRN
  Administered 2016-01-20 (×3): 50 ug via INTRAVENOUS

## 2016-01-20 MED ORDER — LIDOCAINE HCL (PF) 1 % IJ SOLN
INTRAMUSCULAR | Status: AC
  Filled 2016-01-20: qty 30

## 2016-01-20 MED ORDER — CEFAZOLIN SODIUM-DEXTROSE 2-4 GM/100ML-% IV SOLN
2.0000 g | INTRAVENOUS | Status: DC
  Filled 2016-01-20 (×2): qty 100

## 2016-01-20 MED ORDER — ASPIRIN 81 MG PO CHEW: 81.0000 mg | CHEWABLE_TABLET | Freq: Every day | ORAL | Status: DC

## 2016-01-20 MED ORDER — ONDANSETRON HCL 4 MG/2ML IJ SOLN: 4.0000 mg | Freq: Four times a day (QID) | INTRAMUSCULAR | Status: DC | PRN

## 2016-01-20 MED ORDER — SODIUM CHLORIDE 0.9 % IV SOLN
INTRAVENOUS | Status: DC
  Administered 2016-01-20: 10:00:00 via INTRAVENOUS

## 2016-01-20 MED ORDER — LABETALOL HCL 5 MG/ML IV SOLN
INTRAVENOUS | Status: AC
  Administered 2016-01-20: 10 mg via INTRAVENOUS
  Filled 2016-01-20: qty 4

## 2016-01-20 MED ORDER — BACLOFEN 5 MG HALF TABLET
15.0000 mg | ORAL_TABLET | Freq: Three times a day (TID) | ORAL | Status: DC
  Filled 2016-01-20 (×2): qty 1

## 2016-01-20 MED ORDER — MIDAZOLAM HCL 5 MG/5ML IJ SOLN
INTRAMUSCULAR | Status: AC
  Filled 2016-01-20: qty 5

## 2016-01-20 MED ORDER — DEXTROSE 5 % IV SOLN
1.5000 g | Freq: Once | INTRAVENOUS | Status: AC
  Administered 2016-01-20: 1.5 g via INTRAVENOUS

## 2016-01-20 MED ORDER — ACETAMINOPHEN 650 MG RE SUPP: 325.0000 mg | RECTAL | Status: DC | PRN

## 2016-01-20 MED ORDER — MORPHINE SULFATE ER 30 MG PO TBCR
30.0000 mg | EXTENDED_RELEASE_TABLET | Freq: Three times a day (TID) | ORAL | Status: DC
  Administered 2016-01-20 – 2016-01-21 (×2): 30 mg via ORAL
  Filled 2016-01-20 (×2): qty 1

## 2016-01-20 MED ORDER — CLOPIDOGREL BISULFATE 75 MG PO TABS
75.0000 mg | ORAL_TABLET | Freq: Every day | ORAL | Status: DC
  Administered 2016-01-20 – 2016-01-21 (×2): 75 mg via ORAL
  Filled 2016-01-20 (×2): qty 1

## 2016-01-20 MED ORDER — HEPARIN SODIUM (PORCINE) 1000 UNIT/ML IJ SOLN
INTRAMUSCULAR | Status: AC
Start: 2016-01-20 — End: 2016-01-20
  Filled 2016-01-20: qty 1

## 2016-01-20 MED ORDER — LIDOCAINE-PRILOCAINE 2.5-2.5 % EX CREA
1.0000 "application " | TOPICAL_CREAM | CUTANEOUS | Status: DC | PRN
  Filled 2016-01-20: qty 5

## 2016-01-20 MED ORDER — GABAPENTIN 300 MG PO CAPS
600.0000 mg | ORAL_CAPSULE | Freq: Three times a day (TID) | ORAL | Status: DC
  Administered 2016-01-20 – 2016-01-21 (×3): 600 mg via ORAL
  Filled 2016-01-20 (×3): qty 2

## 2016-01-20 MED ORDER — ATROPINE SULFATE 1 MG/10ML IJ SOSY
PREFILLED_SYRINGE | INTRAMUSCULAR | Status: AC
  Filled 2016-01-20: qty 10

## 2016-01-20 MED ORDER — ASPIRIN EC 81 MG PO TBEC
81.0000 mg | DELAYED_RELEASE_TABLET | Freq: Every day | ORAL | Status: DC
  Administered 2016-01-20 – 2016-01-21 (×2): 81 mg via ORAL
  Filled 2016-01-20 (×2): qty 1

## 2016-01-20 MED ORDER — PANTOPRAZOLE SODIUM 40 MG PO TBEC
40.0000 mg | DELAYED_RELEASE_TABLET | Freq: Every day | ORAL | Status: DC
  Administered 2016-01-20 – 2016-01-21 (×2): 40 mg via ORAL
  Filled 2016-01-20 (×2): qty 1

## 2016-01-20 MED ORDER — KCL IN DEXTROSE-NACL 20-5-0.9 MEQ/L-%-% IV SOLN
INTRAVENOUS | Status: DC
  Administered 2016-01-20 – 2016-01-21 (×2): via INTRAVENOUS
  Filled 2016-01-20 (×3): qty 1000

## 2016-01-20 MED ORDER — ATORVASTATIN CALCIUM 80 MG PO TABS
80.0000 mg | ORAL_TABLET | Freq: Every day | ORAL | Status: DC
  Administered 2016-01-20: 80 mg via ORAL
  Filled 2016-01-20 (×2): qty 1

## 2016-01-20 MED ORDER — ONDANSETRON HCL 4 MG PO TABS: 8.0000 mg | ORAL_TABLET | Freq: Three times a day (TID) | ORAL | Status: DC | PRN

## 2016-01-20 MED ORDER — DOCUSATE SODIUM 100 MG PO CAPS
100.0000 mg | ORAL_CAPSULE | Freq: Every day | ORAL | Status: DC
  Filled 2016-01-20: qty 1

## 2016-01-20 MED ORDER — BIVALIRUDIN BOLUS VIA INFUSION
0.0500 mg/kg | Freq: Once | INTRAVENOUS | Status: AC
  Administered 2016-01-20: 3.1 mg via INTRAVENOUS

## 2016-01-20 MED ORDER — CHLORHEXIDINE GLUCONATE 0.12 % MT SOLN
15.0000 mL | Freq: Two times a day (BID) | OROMUCOSAL | Status: DC
  Filled 2016-01-20: qty 15

## 2016-01-20 MED ORDER — BACLOFEN 10 MG PO TABS
15.0000 mg | ORAL_TABLET | Freq: Three times a day (TID) | ORAL | Status: DC
  Administered 2016-01-20 – 2016-01-21 (×3): 15 mg via ORAL
  Filled 2016-01-20 (×3): qty 2

## 2016-01-20 MED ORDER — ALUM & MAG HYDROXIDE-SIMETH 200-200-20 MG/5ML PO SUSP: 15.0000 mL | ORAL | Status: DC | PRN

## 2016-01-20 MED ORDER — PHENOL 1.4 % MT LIQD
1.0000 | OROMUCOSAL | Status: DC | PRN
  Filled 2016-01-20: qty 177

## 2016-01-20 MED ORDER — SODIUM CHLORIDE 0.9 % IV SOLN
1.7500 mg/kg/h | INTRAVENOUS | Status: DC
  Administered 2016-01-20: 1.75 mg/kg/h via INTRAVENOUS

## 2016-01-20 SURGICAL SUPPLY — 22 items
BALLN VIATRAC 5X40X135 (BALLOONS) ×4
BALLOON VIATRAC 5X40X135 (BALLOONS) ×2 IMPLANT
CATH 5FR JB2 100CM (CATHETERS) ×4 IMPLANT
CATH ANGIO 5F 100CM .035 PIG (CATHETERS) ×4 IMPLANT
CATH BEACON 5 .038 100 JB1 TIP (CATHETERS) ×4 IMPLANT
CATH G 5FX100 (CATHETERS) ×4 IMPLANT
CATH G XP .038X100 (CATHETERS) ×4 IMPLANT
DEVICE CLOSURE MYNXGRIP 6/7F (Vascular Products) ×4 IMPLANT
DEVICE EMBOSHIELD NAV6 4.0-7.0 (WIRE) ×4 IMPLANT
DEVICE PRESTO INFLATION (MISCELLANEOUS) ×8 IMPLANT
DEVICE TORQUE (MISCELLANEOUS) ×4 IMPLANT
GLIDEWIRE ANGLED SS 035X260CM (WIRE) ×12 IMPLANT
GUIDEWIRE AMPLATZ SS .035X260 (WIRE) ×4 IMPLANT
KIT CAROTID MANIFOLD (MISCELLANEOUS) ×4 IMPLANT
PACK ANGIOGRAPHY (CUSTOM PROCEDURE TRAY) ×4 IMPLANT
SET INTRO CAPELLA COAXIAL (SET/KITS/TRAYS/PACK) ×4 IMPLANT
SHEATH BRITE TIP 6FRX11 (SHEATH) ×4 IMPLANT
SHEATH SHUTTLE 6FRX80 (SHEATH) ×4 IMPLANT
SHEATH SHUTTLE SELECT 6F (SHEATH) ×4 IMPLANT
STENT XACT CAR 10-8X40X136 (Permanent Stent) ×4 IMPLANT
VALVE CHECKFLO PERFORMER (SHEATH) ×4 IMPLANT
WIRE J 3MM .035X145CM (WIRE) ×4 IMPLANT

## 2016-01-20 NOTE — Op Note (Signed)
OPERATIVE NOTE DATE: 01/20/2016  PROCEDURE: 1.  ultrasound guidance for vascular access right femoral artery 2.  Placement of a 10 x 8 x 40 Exact stent with the use of the NAV-6 embolic protection device in the right internal carotid artery  PRE-OPERATIVE DIAGNOSIS: 1. Symptomatic critical stenosis of the right internal carotid artery.   POST-OPERATIVE DIAGNOSIS:  Same as above  SURGEON: Katha Cabal, M.D.  ASSISTANT(S): Leotis Pain, MD  ANESTHESIA: local/MCS  ESTIMATED BLOOD LOSS:  50 cc  CONTRAST: 85 cc  FLUORO TIME: 29.7 minutes  MODERATE CONSCIOUS SEDATION TIME:  Approximately 120 minutes using Versed and Fentanyl  FINDING(S): 1.   Greater than 90% stenosis of the mid right internal carotid artery stenosis  SPECIMEN(S):   none  INDICATIONS:   Patient is a 64 who presents with  symptomatic critical stenosis of the right internal carotid artery.  The patient has had head and neck cancer as well as a very high bifurcation and carotid artery stenting was felt to be preferred to endarterectomy for that reason.  Risks and benefits were discussed and informed consent was obtained.   DESCRIPTION: After obtaining full informed written consent, the patient was brought back to the vascular suite and placed supine upon the table.  The patient received IV antibiotics prior to induction.Moderate conscious sedation was administered during a face to face encounter with the patient throughout the procedure with my supervision of the RN administering medicines and monitoring the patients vital signs and mental status throughout from the start of the procedure until the patient was taken to the recovery room.  After obtaining adequate anesthesia, the patient was prepped and draped in the standard fashion.   The right femoral artery was visualized with ultrasound and found to be widely patent. It was then accessed under direct ultrasound guidance without difficulty with a Seldinger needle.  A permanent image was recorded. A J-wire was placed and we then placed a 6 French sheath. The patient was then heparinized and a total of 11,000 units of intravenous heparin were given and an ACT was checked to confirm successful anticoagulation because the HCT which registering 250 he was also given a half dose of Angiomax and then initiated on the Angiomax drip. A pigtail catheter was then placed into the ascending aorta. This showed type III arch. I then selectively cannulated the right internal carotid with a Lacinda Axon catheter and advanced into the mid right common carotid artery.  Cervical and cerebral carotid angiography was then performed. There were no obvious intracranial filling defects with the cerebral circulation. The carotid bifurcation demonstrated patency but in the midportion of the internal carotid artery before the siphon there is a high-grade lesion. This correlated with the CT scan..  I then advanced into the external carotid artery with a Glidewire and the [facility] catheter and then exchanged for the Amplatz Super Stiff wire. Over the Amplatz Super Stiff wire, a 6 [place] shuttle sheath was placed into the mid common carotid artery. I then used the NAV-6  Embolic protection device and crossed the lesion and parked this in the distal internal carotid artery at the base of the skull.  I then selected a 10 x 8 x 40 Exact stent. This was deployed across the lesion encompassing it in its entirety. A 4 by 40 and subsequently a 5 x 40 balloon was used to post dilate the stent. Only about a 30 % residual stenosis was present after angioplasty. Completion angiogram showed normal intracranial  filling without new defects. At this point I elected to terminate the procedure. The sheath was removed and minx closure device was deployed in the left femoral artery with excellent hemostatic result. The patient was taken to the recovery room in stable condition having tolerated the procedure  well.  COMPLICATIONS: none  CONDITION: stable  Katha Cabal 01/20/2016 3:23 PM

## 2016-01-20 NOTE — H&P (Signed)
[facility] History & Physical Update  The patient was interviewed and re-examined.  The patient's previous History and Physical has been reviewed and is unchanged.  There is no change in the plan of care. We plan to proceed with the scheduled procedure.  Gurinder Toral, Dolores Lory, MD  01/20/2016, 10:19 AM

## 2016-01-21 ENCOUNTER — Encounter: Payer: Self-pay | Admitting: Vascular Surgery

## 2016-01-21 LAB — BASIC METABOLIC PANEL
ANION GAP: 7 (ref 5–15)
BUN: 10 mg/dL (ref 6–20)
CALCIUM: 8.5 mg/dL — AB (ref 8.9–10.3)
CO2: 25 mmol/L (ref 22–32)
Chloride: 103 mmol/L (ref 101–111)
Creatinine, Ser: 0.54 mg/dL — ABNORMAL LOW (ref 0.61–1.24)
Glucose, Bld: 114 mg/dL — ABNORMAL HIGH (ref 65–99)
POTASSIUM: 3.3 mmol/L — AB (ref 3.5–5.1)
Sodium: 135 mmol/L (ref 135–145)

## 2016-01-21 LAB — CBC
HEMATOCRIT: 36.6 % — AB (ref 40.0–52.0)
Hemoglobin: 12.6 g/dL — ABNORMAL LOW (ref 13.0–18.0)
MCH: 30.3 pg (ref 26.0–34.0)
MCHC: 34.4 g/dL (ref 32.0–36.0)
MCV: 88 fL (ref 80.0–100.0)
Platelets: 193 10*3/uL (ref 150–440)
RBC: 4.16 MIL/uL — AB (ref 4.40–5.90)
RDW: 13.6 % (ref 11.5–14.5)
WBC: 5.8 10*3/uL (ref 3.8–10.6)

## 2016-01-21 NOTE — Discharge Summary (Signed)
  [facility]    Discharge Summary    Patient ID:  Ryan Rush MRN: [id number] DOB/AGE: 1961-04-23 55 y.o.  Admit date: 01/20/2016 Discharge date: 01/21/2016 Date of Surgery: 01/20/2016 Surgeon: Surgeon(s): Katha Cabal, MD  Admission Diagnosis: RT Carotid Stent    Carotid Artery Stenosis   ABBOTT rep   Dr Delana Meyer w Dr Lucky Cowboy to assist cc:  M Godley  S Wiley    Discharge Diagnoses:  RT Carotid Stent    Carotid Artery Stenosis   ABBOTT rep   Dr Delana Meyer w Dr Lucky Cowboy to assist cc:  M Godley  S Wiley    Secondary Diagnoses: Past Medical History:  Diagnosis Date  . Hypertension   . 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 Tobacco use     Procedure(s): Carotid PTA/Stent Intervention Carotid Angiography  Discharged Condition: good  HPI:  Patient did well with carotid stenting on the right.  He has had a normal post operative course and is fit for [facility] Course:  Ryan Rush is a 55 y.o. male is S/P Right Procedure(s): Carotid PTA/Stent Intervention Carotid Angiography Physical exam: groin CD&I neuro unchanged  Pt. voiding and taking PO diet without difficulty. Pt pain controlled with PO pain meds. Labs as below Complications:none  Consults: none   Significant Diagnostic Studies: CBC Lab Results  Component Value Date   WBC 5.8 01/21/2016   HGB 12.6 (L) 01/21/2016   HCT 36.6 (L) 01/21/2016   MCV 88.0 01/21/2016   PLT 193 01/21/2016    BMET    Component Value Date/Time   NA 135 01/21/2016 0341   K 3.3 (L) 01/21/2016 0341   CL 103 01/21/2016 0341   CO2 25 01/21/2016 0341   GLUCOSE 114 (H) 01/21/2016 0341   BUN 10 01/21/2016 0341   CREATININE 0.54 (L) 01/21/2016 0341   CALCIUM 8.5 (L) 01/21/2016 0341   GFRNONAA >60 01/21/2016 0341   GFRAA >60 01/21/2016 0341   COAG Lab Results  Component Value Date   INR 0.89 01/20/2016   INR 1.13 11/09/2015     Disposition:   Discharge to : home  Discharge Instructions    Call MD for:  redness, tenderness, or signs of infection (pain, swelling, bleeding, redness, odor or green/yellow discharge around incision site)    Complete by:  As directed   Call MD for:  severe or increased pain, loss or decreased feeling  in affected limb(s)    Complete by:  As directed   Call MD for:  temperature >100.5    Complete by:  As directed   Resume previous diet    Complete by:  As directed      Verbal and written Discharge instructions given to the patient. Wound care per Discharge AVS Follow-up Information    Weda Baumgarner, Dolores Lory, MD Follow up in 3 week(s).   Specialties:  Vascular Surgery, Cardiology, Radiology, Vascular Surgery Contact information: [place] [id number] [id number]           Signed: Katha Cabal, MD  01/21/2016, 10:35 AM

## 2016-01-21 NOTE — Plan of Care (Signed)
Problem: Safety: Goal: Ability to remain free from injury will improve Outcome: Progressing BP 120/70   Pulse 95   Temp 99.1 F (37.3 C) (Oral)   Resp 17   Ht 5\' 9"  (1.753 m)   Wt 59.9 kg (132 lb 0.9 oz)   SpO2 94%   BMI 19.50 kg/m  POC reviewed with patient, cont on q2 turns, vital signs closely monitored and any concerns addressed at this time. Will continue to monitor. Hoping to dc Diet advanced to regular PAD to right groin is out and patient tolerated it well.

## 2016-02-02 ENCOUNTER — Ambulatory Visit
Admission: RE | Admit: 2016-02-02 | Discharge: 2016-02-02 | Disposition: A | Discharge status: Home / Self Care | Payer: Medicaid Other | Source: Ambulatory Visit | Attending: Internal Medicine | Admitting: Internal Medicine

## 2016-02-02 DIAGNOSIS — R918 Other nonspecific abnormal finding of lung field: Secondary | ICD-10-CM | POA: Diagnosis not present

## 2016-02-02 DIAGNOSIS — Z95828 Presence of other vascular implants and grafts: Secondary | ICD-10-CM | POA: Diagnosis not present

## 2016-02-02 DIAGNOSIS — C099 Malignant neoplasm of tonsil, unspecified: Secondary | ICD-10-CM | POA: Insufficient documentation

## 2016-02-02 DIAGNOSIS — C787 Secondary malignant neoplasm of liver and intrahepatic bile duct: Secondary | ICD-10-CM | POA: Diagnosis not present

## 2016-02-02 DIAGNOSIS — I878 Other specified disorders of veins: Secondary | ICD-10-CM | POA: Diagnosis not present

## 2016-02-02 DIAGNOSIS — N200 Calculus of kidney: Secondary | ICD-10-CM | POA: Insufficient documentation

## 2016-02-02 DIAGNOSIS — C7989 Secondary malignant neoplasm of other specified sites: Secondary | ICD-10-CM | POA: Insufficient documentation

## 2016-02-02 DIAGNOSIS — R590 Localized enlarged lymph nodes: Secondary | ICD-10-CM | POA: Insufficient documentation

## 2016-02-02 DIAGNOSIS — I2510 Atherosclerotic heart disease of native coronary artery without angina pectoris: Secondary | ICD-10-CM | POA: Insufficient documentation

## 2016-02-02 DIAGNOSIS — I700 Atherosclerosis of aorta: Secondary | ICD-10-CM | POA: Diagnosis not present

## 2016-02-02 MED ORDER — IOPAMIDOL (ISOVUE-300) INJECTION 61%
100.0000 mL | Freq: Once | INTRAVENOUS | Status: AC | PRN
  Administered 2016-02-02: 100 mL via INTRAVENOUS

## 2016-02-03 ENCOUNTER — Inpatient Hospital Stay (HOSPITAL_BASED_OUTPATIENT_CLINIC_OR_DEPARTMENT_OTHER): Payer: Medicaid Other | Admitting: Internal Medicine

## 2016-02-03 ENCOUNTER — Inpatient Hospital Stay: Payer: Medicaid Other

## 2016-02-03 VITALS — BP 121/65 | HR 93 | Temp 96.1°F | Resp 18 | Wt 126.6 lb

## 2016-02-03 DIAGNOSIS — Z7982 Long term (current) use of aspirin: Secondary | ICD-10-CM | POA: Diagnosis not present

## 2016-02-03 DIAGNOSIS — Z79899 Other long term (current) drug therapy: Secondary | ICD-10-CM | POA: Diagnosis not present

## 2016-02-03 DIAGNOSIS — F17210 Nicotine dependence, cigarettes, uncomplicated: Secondary | ICD-10-CM

## 2016-02-03 DIAGNOSIS — I10 Essential (primary) hypertension: Secondary | ICD-10-CM | POA: Diagnosis not present

## 2016-02-03 DIAGNOSIS — Z5111 Encounter for antineoplastic chemotherapy: Secondary | ICD-10-CM | POA: Diagnosis present

## 2016-02-03 DIAGNOSIS — M25552 Pain in left hip: Secondary | ICD-10-CM

## 2016-02-03 DIAGNOSIS — C099 Malignant neoplasm of tonsil, unspecified: Secondary | ICD-10-CM | POA: Diagnosis not present

## 2016-02-03 DIAGNOSIS — C787 Secondary malignant neoplasm of liver and intrahepatic bile duct: Secondary | ICD-10-CM | POA: Diagnosis not present

## 2016-02-03 DIAGNOSIS — Z87820 Personal history of traumatic brain injury: Secondary | ICD-10-CM | POA: Diagnosis not present

## 2016-02-03 DIAGNOSIS — C01 Malignant neoplasm of base of tongue: Secondary | ICD-10-CM

## 2016-02-03 DIAGNOSIS — C7951 Secondary malignant neoplasm of bone: Secondary | ICD-10-CM | POA: Diagnosis not present

## 2016-02-03 LAB — COMPREHENSIVE METABOLIC PANEL
ALBUMIN: 3.2 g/dL — AB (ref 3.5–5.0)
ALT: 20 U/L (ref 17–63)
ANION GAP: 9 (ref 5–15)
AST: 38 U/L (ref 15–41)
Alkaline Phosphatase: 91 U/L (ref 38–126)
BILIRUBIN TOTAL: 0.6 mg/dL (ref 0.3–1.2)
BUN: 13 mg/dL (ref 6–20)
CHLORIDE: 98 mmol/L — AB (ref 101–111)
CO2: 23 mmol/L (ref 22–32)
Calcium: 8.7 mg/dL — ABNORMAL LOW (ref 8.9–10.3)
Creatinine, Ser: 0.56 mg/dL — ABNORMAL LOW (ref 0.61–1.24)
GFR calc Af Amer: >60 mL/min (ref >60)
GFR calc non Af Amer: >60 mL/min (ref >60)
GLUCOSE: 129 mg/dL — AB (ref 65–99)
POTASSIUM: 3.4 mmol/L — AB (ref 3.5–5.1)
Sodium: 130 mmol/L — ABNORMAL LOW (ref 135–145)
TOTAL PROTEIN: 8.4 g/dL — AB (ref 6.5–8.1)

## 2016-02-03 LAB — CBC WITH DIFFERENTIAL/PLATELET
BASOS ABS: 0 10*3/uL (ref 0–0.1)
Basophils Relative: 0 %
Eosinophils Absolute: 0.2 10*3/uL (ref 0–0.7)
Eosinophils Relative: 3 %
HEMATOCRIT: 34.6 % — AB (ref 40.0–52.0)
Hemoglobin: 11.9 g/dL — ABNORMAL LOW (ref 13.0–18.0)
LYMPHS ABS: 0.4 10*3/uL — AB (ref 1.0–3.6)
LYMPHS PCT: 5 %
MCH: 29.4 pg (ref 26.0–34.0)
MCHC: 34.3 g/dL (ref 32.0–36.0)
MCV: 85.8 fL (ref 80.0–100.0)
MONO ABS: 0.4 10*3/uL (ref 0.2–1.0)
Monocytes Relative: 6 %
NEUTROS ABS: 6.6 10*3/uL — AB (ref 1.4–6.5)
Neutrophils Relative %: 86 %
Platelets: 286 10*3/uL (ref 150–440)
RBC: 4.04 MIL/uL — AB (ref 4.40–5.90)
RDW: 13.7 % (ref 11.5–14.5)
WBC: 7.6 10*3/uL (ref 3.8–10.6)

## 2016-02-03 MED ORDER — SODIUM CHLORIDE 0.9 % IV SOLN: 569.5000 mg | Freq: Once | INTRAVENOUS | Status: DC

## 2016-02-03 MED ORDER — PALONOSETRON HCL INJECTION 0.25 MG/5ML
0.2500 mg | Freq: Once | INTRAVENOUS | Status: AC
  Administered 2016-02-03: 0.25 mg via INTRAVENOUS
  Filled 2016-02-03: qty 5

## 2016-02-03 MED ORDER — DEXAMETHASONE 4 MG PO TABS: ORAL_TABLET | ORAL | 0 refills | Status: DC

## 2016-02-03 MED ORDER — SODIUM CHLORIDE 0.9 % IV SOLN
6900.0000 mg | INTRAVENOUS | Status: DC
  Administered 2016-02-03: 6900 mg via INTRAVENOUS
  Filled 2016-02-03: qty 100

## 2016-02-03 MED ORDER — SODIUM CHLORIDE 0.9 % IV SOLN
Freq: Once | INTRAVENOUS | Status: AC
  Administered 2016-02-03: 11:00:00 via INTRAVENOUS
  Filled 2016-02-03: qty 1000

## 2016-02-03 MED ORDER — SODIUM CHLORIDE 0.9 % IV SOLN
538.5000 mg | Freq: Once | INTRAVENOUS | Status: AC
  Administered 2016-02-03: 540 mg via INTRAVENOUS
  Filled 2016-02-03: qty 54

## 2016-02-03 MED ORDER — DENOSUMAB 120 MG/1.7ML ~~LOC~~ SOLN
120.0000 mg | Freq: Once | SUBCUTANEOUS | Status: AC
Start: 2016-02-03 — End: 2016-02-03
  Administered 2016-02-03: 120 mg via SUBCUTANEOUS
  Filled 2016-02-03: qty 1.7

## 2016-02-03 MED ORDER — SODIUM CHLORIDE 0.9 % IV SOLN
10.0000 mg | Freq: Once | INTRAVENOUS | Status: AC
  Administered 2016-02-03: 10 mg via INTRAVENOUS
  Filled 2016-02-03: qty 1

## 2016-02-03 NOTE — Assessment & Plan Note (Addendum)
#   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 Progression noted- noted clinically/ on CT Neck/C/A/P.   # Proceed with 5FU [over 6 days]-carbo; hold cetuximab cycle #1 [check antibodies alpha galactose].   #  Left hip pain- worsening; recommend hydrocodone every 4h;  Currently on  MS  To 30 TID. RT until 15th. Start today X-geva. Will check with Rt re: radiation to hip. Also start dexamethasone 4 TID x7 days.   # Left sided stroke-s/p stenting with dr.Schneir 2 weeks ago.    # Follow-up with me in 3 weeks with CBC CMP; labs/IVFs in 10 days.

## 2016-02-03 NOTE — Progress Notes (Signed)
[facility] OFFICE PROGRESS NOTE  Patient Care Team: Kirk Ruths, MD as PCP - General (Internal Medicine) Margaretha Sheffield, MD (Otolaryngology) Katha Cabal, MD (Vascular Surgery)   SUMMARY OF ONCOLOGIC HISTORY: Oncology History   # MARCH 2017- SQUAMOUS CELL CA of Right tongue base/tonsill-bilateral cervical LN/abdominal LN with metastases to liver/ bone; STAGE IV; Feb 2017- Started TPF q3 W x3; May 2017- Partial response.   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  Patient interim underwent carotid artery stenting. Procedure was uneventful.  Patient continues to complain of increasing left hip pain. Is currently on MS Contin 3 times a day and hydrocodone. He continues to notice significant swelling of his right and left neck masses.  Appetite is fair at best No new lumps or bumps.  No nausea no vomiting diarrhea.  Patient is currently in the assisted living; he'll be living in a motel starting tomorrow. He is alone.  REVIEW OF SYSTEMS:  A complete 10 point review of system is done which is negative except mentioned above/history of present illness.   PAST MEDICAL HISTORY :  Past Medical History:  Diagnosis Date  . Hypertension   . 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 Tobacco use     PAST SURGICAL HISTORY :   Past Surgical History:  Procedure Laterality Date  . PERIPHERAL VASCULAR CATHETERIZATION N/A 08/18/2015   Procedure: Glori Luis Cath Insertion;  Surgeon: Katha Cabal, MD;  Location: [facility];  Service: Cardiovascular;  Laterality: N/A;  . PERIPHERAL VASCULAR CATHETERIZATION Right 01/20/2016   Procedure: Carotid PTA/Stent Intervention;  Surgeon: Katha Cabal, MD;  Location: [facility];  Service: Cardiovascular;  Laterality: Right;  . PERIPHERAL VASCULAR CATHETERIZATION  01/20/2016   Procedure: Carotid Angiography;  Surgeon: Katha Cabal, MD;  Location: [facility];  Service: Cardiovascular;;    FAMILY HISTORY :   Family History  Problem Relation Age of Onset  . Cancer Mother     metastasized    SOCIAL HISTORY:   Social History  Substance Use Topics  . Smoking status: Current Every Day Smoker    Packs/day: 1.00    Types: Cigarettes  . Smokeless tobacco: Never Used  . Alcohol use No     Comment: occasional    ALLERGIES:  has No Known Allergies.  MEDICATIONS:  Current Outpatient Prescriptions  Medication Sig Dispense Refill  . amLODipine (NORVASC) 5 MG tablet Take 1 tablet (5 mg total) by mouth daily. 60 tablet 1  . aspirin 81 MG chewable tablet Chew 1 tablet (81 mg total) by mouth daily. 30 tablet 2  . atorvastatin (LIPITOR) 80 MG tablet Take 1 tablet (80 mg total) by mouth  daily at 6 PM. 30 tablet 2  . baclofen (LIORESAL) 10 MG tablet Take 15 mg by mouth 3 (three) times daily.    . clopidogrel (PLAVIX) 75 MG tablet Take 1 tablet (75 mg total) by mouth daily. 30 tablet 2  . diphenoxylate-atropine (LOMOTIL) 2.5-0.025 MG tablet Take 1 tablet by mouth 4 (four) times daily as needed for diarrhea or loose stools. Take it along with immodium 30 tablet 0  . gabapentin (NEURONTIN) 300 MG capsule Take 2 capsules (600 mg total) by mouth 3 (three) times daily. 180 capsule 3  . HYDROcodone-acetaminophen  (NORCO/VICODIN) 5-325 MG tablet Take 1 tablet by mouth every 4 (four) hours as needed for moderate pain. (Patient taking differently: Take 1 tablet by mouth every 6 (six) hours as needed for moderate pain. ) 120 tablet 0  . lidocaine-prilocaine (EMLA) cream Apply 1 application topically as needed. Apply to port a cath site 1 hours before treatments 30 g 3  . loperamide (IMODIUM) 2 MG capsule Take 1 capsule (2 mg total) by mouth as needed for diarrhea or loose stools. 30 capsule 0  . magic mouthwash w/lidocaine SOLN Take 5 mLs by mouth 4 (four) times daily as needed for mouth pain. 250 mL 3  . morphine (MS CONTIN) 30 MG 12 hr tablet Take 1 tablet (30 mg total) by mouth every 8 (eight) hours. (Patient taking differently: Take 30 mg by mouth every 12 (twelve) hours. ) 65 tablet 0  . ondansetron (ZOFRAN) 8 MG tablet Take 1 tablet (8 mg total) by mouth every 8 (eight) hours as needed for nausea or vomiting. 30 tablet 0  . pantoprazole (PROTONIX) 40 MG tablet Take 1 tablet (40 mg total) by mouth daily. 60 tablet 3  . prochlorperazine (COMPAZINE) 10 MG tablet Take 1 tablet (10 mg total) by mouth every 6 (six) hours as needed for nausea or vomiting. 60 tablet 0  . ranitidine (ZANTAC) 75 MG tablet Take 75 mg by mouth daily.    . traZODone (DESYREL) 50 MG tablet Take 50 mg by mouth at bedtime.    Marland Kitchen dexamethasone (DECADRON) 4 MG tablet Every 8 hours with food 60 tablet 0   No current facility-administered medications for this visit.    Facility-Administered Medications Ordered in Other Visits  Medication Dose Route Frequency Provider Last Rate Last Dose  . fluorouracil (ADRUCIL) 6,900 mg in sodium chloride 0.9 % 112 mL chemo infusion  6,900 mg Intravenous 6 days Cammie Sickle, MD   6,900 mg at 02/03/16 1208    PHYSICAL EXAMINATION: ECOG PERFORMANCE STATUS: 0 - Asymptomatic  BP 121/65 (BP Location: Left Arm, Patient Position: Sitting)   Pulse 93   Temp (!) 96.1 F (35.6 C) (Tympanic)   Resp 18    Wt 126 lb 9 oz (57.4 kg)   BMI 18.69 kg/m   Filed Weights   02/03/16 0911  Weight: 126 lb 9 oz (57.4 kg)    GENERAL: Well-nourished well-developed; Alert, no distress and comfortable.   Alone. He is in a wheelchair. EYES: no pallor or icterus OROPHARYNX: Again noted to have right tonsillar oral ulceration.  NECK: supple, LYMPH: Approximately 3-4 cm bilateral lymph nodes felt bil [increasing in size] LUNGS: clear to auscultation and  No wheeze or crackles HEART/CVS: regular rate & rhythm and no murmurs; No lower extremity edema ABDOMEN:abdomen soft, non-tender and normal bowel sounds Musculoskeletal:no cyanosis of digits and no clubbing  PSYCH: alert & oriented x 3 with fluent speech NEURO: no focal motor/sensory deficits; mild  foot drop noted in Left LE/mild weakness in Left UE [significant improvement]  SKIN:  no rashes or significant lesions  LABORATORY DATA:  I have reviewed the data as listed    Component Value Date/Time   NA 130 (L) 02/03/2016 0851   K 3.4 (L) 02/03/2016 0851   CL 98 (L) 02/03/2016 0851   CO2 23 02/03/2016 0851   GLUCOSE 129 (H) 02/03/2016 0851   BUN 13 02/03/2016 0851   CREATININE 0.56 (L) 02/03/2016 0851   CALCIUM 8.7 (L) 02/03/2016 0851   PROT 8.4 (H) 02/03/2016 0851   ALBUMIN 3.2 (L) 02/03/2016 0851   AST 38 02/03/2016 0851   ALT 20 02/03/2016 0851   ALKPHOS 91 02/03/2016 0851   BILITOT 0.6 02/03/2016 0851   GFRNONAA >60 02/03/2016 0851   GFRAA >60 02/03/2016 0851    No results found for: SPEP, UPEP  Lab Results  Component Value Date   WBC 7.6 02/03/2016   NEUTROABS 6.6 (H) 02/03/2016   HGB 11.9 (L) 02/03/2016   HCT 34.6 (L) 02/03/2016   MCV 85.8 02/03/2016   PLT 286 02/03/2016      Chemistry      Component Value Date/Time   NA 130 (L) 02/03/2016 0851   K 3.4 (L) 02/03/2016 0851   CL 98 (L) 02/03/2016 0851   CO2 23 02/03/2016 0851   BUN 13 02/03/2016 0851   CREATININE 0.56 (L) 02/03/2016 0851      Component Value Date/Time    CALCIUM 8.7 (L) 02/03/2016 0851   ALKPHOS 91 02/03/2016 0851   AST 38 02/03/2016 0851   ALT 20 02/03/2016 0851   BILITOT 0.6 02/03/2016 0851        ASSESSMENT & PLAN:   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 Progression noted- noted clinically/ on CT Neck/C/A/P.   # Proceed with 5FU [over 6 days]-carbo; hold cetuximab cycle #1 [check antibodies alpha galactose].   #  Left hip pain- worsening; recommend hydrocodone every 4h;  Currently on  MS  To 30 TID. RT until 15th. Start today X-geva. Will check with Rt re: radiation to hip. Also start dexamethasone 4 TID x7 days.   # Left sided stroke-s/p stenting with dr.Schneir 2 weeks ago.    # Follow-up with me in 3 weeks with CBC CMP; labs/IVFs in 10 days.    # Overall poor prognosis; patient to move out of the nursing home to living in a motel starting today.   I reviewed the images myself and with the patient.    Cammie Sickle, MD 02/03/2016 1:10 PM

## 2016-02-03 NOTE — Progress Notes (Signed)
Discussed social barriers with patient.  Patient is going to move to the [facility] (in [place]) today--temporary situation. I am working on trying to get in an apartment complex.  He is being discharge from the nursing home facility.  He will be living by himself.  Our cancer center will be transporting him back and forth to his cancer center appointments.  He does not know his room number at the [facility] at this time.  Medicaid will pay for a month's supply for his routine medication.  He will be using the [facility] on [address].  He said that he "would be walking with my seated walker across the street from this hotel to try to pick up my prescriptions."  "medicaid bought me a walker.  I don't have a wheelchair. I don't feel like I need one."  Discussed safety at extent with the use of w/c and seated walker. I advised pt to not use the walker as a w/c as this is not safe practice and would put him at high risk for falls. "Well, I can try to get my girlfriend to get my medications."  Per MD, patient will need an appointment in approximately 10 -12 days s/p his d/c of chemotherapy pump for lab/md/possible IV fluids-2 hrs.  We will hold the erbitux today per md.    Schedule lab/md/carbo/ 5-FU/erbitux in 3 weeks (cbc, metc)

## 2016-02-04 LAB — MISC LABCORP TEST (SEND OUT)
LABCORP TEST CODE: 806562
LABCORP TEST NAME: 1

## 2016-02-09 ENCOUNTER — Inpatient Hospital Stay: Payer: Medicaid Other | Attending: Internal Medicine

## 2016-02-09 VITALS — BP 144/77 | HR 63 | Temp 98.7°F | Resp 18

## 2016-02-09 DIAGNOSIS — C099 Malignant neoplasm of tonsil, unspecified: Secondary | ICD-10-CM | POA: Diagnosis not present

## 2016-02-09 DIAGNOSIS — Z79899 Other long term (current) drug therapy: Secondary | ICD-10-CM | POA: Diagnosis not present

## 2016-02-09 DIAGNOSIS — C7951 Secondary malignant neoplasm of bone: Secondary | ICD-10-CM | POA: Diagnosis not present

## 2016-02-09 DIAGNOSIS — C787 Secondary malignant neoplasm of liver and intrahepatic bile duct: Secondary | ICD-10-CM | POA: Diagnosis not present

## 2016-02-09 DIAGNOSIS — F17210 Nicotine dependence, cigarettes, uncomplicated: Secondary | ICD-10-CM | POA: Diagnosis not present

## 2016-02-09 DIAGNOSIS — C01 Malignant neoplasm of base of tongue: Secondary | ICD-10-CM | POA: Insufficient documentation

## 2016-02-09 MED ORDER — HEPARIN SOD (PORK) LOCK FLUSH 100 UNIT/ML IV SOLN
500.0000 [IU] | Freq: Once | INTRAVENOUS | Status: AC
  Administered 2016-02-09: 500 [IU] via INTRAVENOUS

## 2016-02-09 MED ORDER — SODIUM CHLORIDE 0.9% FLUSH
10.0000 mL | INTRAVENOUS | Status: DC | PRN
  Administered 2016-02-09: 10 mL via INTRAVENOUS
  Filled 2016-02-09: qty 10

## 2016-02-11 ENCOUNTER — Emergency Department
Admission: EM | Admit: 2016-02-11 | Discharge: 2016-02-14 | Disposition: A | Discharge status: Home / Self Care | Payer: Medicaid Other | Source: Home / Self Care

## 2016-02-11 ENCOUNTER — Encounter: Payer: Self-pay | Admitting: Emergency Medicine

## 2016-02-11 DIAGNOSIS — Z85818 Personal history of malignant neoplasm of other sites of lip, oral cavity, and pharynx: Secondary | ICD-10-CM

## 2016-02-11 DIAGNOSIS — I10 Essential (primary) hypertension: Secondary | ICD-10-CM

## 2016-02-11 DIAGNOSIS — Z5321 Procedure and treatment not carried out due to patient leaving prior to being seen by health care provider: Secondary | ICD-10-CM | POA: Insufficient documentation

## 2016-02-11 DIAGNOSIS — K1379 Other lesions of oral mucosa: Secondary | ICD-10-CM

## 2016-02-11 DIAGNOSIS — F17210 Nicotine dependence, cigarettes, uncomplicated: Secondary | ICD-10-CM

## 2016-02-11 NOTE — ED Triage Notes (Signed)
Reports hx of throat CA. Last chemo was 1 month ago.  Unable to eat due to throat pain and sore in mouth x 1 wk.

## 2016-02-14 ENCOUNTER — Encounter: Payer: Self-pay | Admitting: Emergency Medicine

## 2016-02-14 ENCOUNTER — Emergency Department: Payer: Medicaid Other

## 2016-02-14 ENCOUNTER — Other Ambulatory Visit: Payer: Self-pay

## 2016-02-14 ENCOUNTER — Inpatient Hospital Stay
Admission: EM | Admit: 2016-02-14 | Discharge: 2016-02-16 | DRG: 871 | Disposition: A | Discharge status: Hospice | Payer: Medicaid Other | Attending: Internal Medicine | Admitting: Internal Medicine

## 2016-02-14 DIAGNOSIS — E860 Dehydration: Secondary | ICD-10-CM | POA: Diagnosis present

## 2016-02-14 DIAGNOSIS — Z7902 Long term (current) use of antithrombotics/antiplatelets: Secondary | ICD-10-CM

## 2016-02-14 DIAGNOSIS — Z590 Homelessness: Secondary | ICD-10-CM

## 2016-02-14 DIAGNOSIS — J690 Pneumonitis due to inhalation of food and vomit: Secondary | ICD-10-CM | POA: Diagnosis present

## 2016-02-14 DIAGNOSIS — K121 Other forms of stomatitis: Secondary | ICD-10-CM | POA: Diagnosis present

## 2016-02-14 DIAGNOSIS — Z66 Do not resuscitate: Secondary | ICD-10-CM | POA: Diagnosis present

## 2016-02-14 DIAGNOSIS — Z515 Encounter for palliative care: Secondary | ICD-10-CM | POA: Diagnosis present

## 2016-02-14 DIAGNOSIS — I10 Essential (primary) hypertension: Secondary | ICD-10-CM | POA: Diagnosis present

## 2016-02-14 DIAGNOSIS — E43 Unspecified severe protein-calorie malnutrition: Secondary | ICD-10-CM | POA: Diagnosis present

## 2016-02-14 DIAGNOSIS — A419 Sepsis, unspecified organism: Principal | ICD-10-CM | POA: Diagnosis present

## 2016-02-14 DIAGNOSIS — J9601 Acute respiratory failure with hypoxia: Secondary | ICD-10-CM | POA: Diagnosis present

## 2016-02-14 DIAGNOSIS — R531 Weakness: Secondary | ICD-10-CM

## 2016-02-14 DIAGNOSIS — F17210 Nicotine dependence, cigarettes, uncomplicated: Secondary | ICD-10-CM | POA: Diagnosis present

## 2016-02-14 DIAGNOSIS — Z681 Body mass index (BMI) 19 or less, adult: Secondary | ICD-10-CM

## 2016-02-14 DIAGNOSIS — C787 Secondary malignant neoplasm of liver and intrahepatic bile duct: Secondary | ICD-10-CM | POA: Diagnosis present

## 2016-02-14 DIAGNOSIS — E785 Hyperlipidemia, unspecified: Secondary | ICD-10-CM | POA: Diagnosis present

## 2016-02-14 DIAGNOSIS — C01 Malignant neoplasm of base of tongue: Secondary | ICD-10-CM | POA: Diagnosis present

## 2016-02-14 DIAGNOSIS — E876 Hypokalemia: Secondary | ICD-10-CM | POA: Diagnosis present

## 2016-02-14 DIAGNOSIS — Z7952 Long term (current) use of systemic steroids: Secondary | ICD-10-CM

## 2016-02-14 DIAGNOSIS — R0682 Tachypnea, not elsewhere classified: Secondary | ICD-10-CM | POA: Diagnosis present

## 2016-02-14 DIAGNOSIS — C7951 Secondary malignant neoplasm of bone: Secondary | ICD-10-CM | POA: Diagnosis present

## 2016-02-14 DIAGNOSIS — Z7982 Long term (current) use of aspirin: Secondary | ICD-10-CM

## 2016-02-14 DIAGNOSIS — Z8673 Personal history of transient ischemic attack (TIA), and cerebral infarction without residual deficits: Secondary | ICD-10-CM

## 2016-02-14 DIAGNOSIS — T451X5A Adverse effect of antineoplastic and immunosuppressive drugs, initial encounter: Secondary | ICD-10-CM | POA: Diagnosis present

## 2016-02-14 DIAGNOSIS — R0902 Hypoxemia: Secondary | ICD-10-CM

## 2016-02-14 DIAGNOSIS — Z79899 Other long term (current) drug therapy: Secondary | ICD-10-CM

## 2016-02-14 DIAGNOSIS — R0602 Shortness of breath: Secondary | ICD-10-CM

## 2016-02-14 DIAGNOSIS — M21372 Foot drop, left foot: Secondary | ICD-10-CM | POA: Diagnosis present

## 2016-02-14 HISTORY — DX: Malignant neoplasm of tonsil, unspecified: C09.9

## 2016-02-14 LAB — CBC WITH DIFFERENTIAL/PLATELET
BASOS PCT: 1 %
Basophils Absolute: 0 10*3/uL (ref 0–0.1)
EOS ABS: 0 10*3/uL (ref 0–0.7)
EOS PCT: 0 %
HCT: 41 % (ref 40.0–52.0)
Hemoglobin: 13.8 g/dL (ref 13.0–18.0)
Lymphocytes Relative: 8 %
Lymphs Abs: 0.2 10*3/uL — ABNORMAL LOW (ref 1.0–3.6)
MCH: 28.3 pg (ref 26.0–34.0)
MCHC: 33.8 g/dL (ref 32.0–36.0)
MCV: 83.9 fL (ref 80.0–100.0)
MONO ABS: 0.2 10*3/uL (ref 0.2–1.0)
MONOS PCT: 8 %
NEUTROS PCT: 83 %
Neutro Abs: 2.3 10*3/uL (ref 1.4–6.5)
PLATELETS: 198 10*3/uL (ref 150–440)
RBC: 4.89 MIL/uL (ref 4.40–5.90)
RDW: 13.6 % (ref 11.5–14.5)
WBC: 2.8 10*3/uL — ABNORMAL LOW (ref 3.8–10.6)

## 2016-02-14 LAB — COMPREHENSIVE METABOLIC PANEL
ALK PHOS: 75 U/L (ref 38–126)
ALT: 22 U/L (ref 17–63)
AST: 25 U/L (ref 15–41)
Albumin: 3.4 g/dL — ABNORMAL LOW (ref 3.5–5.0)
Anion gap: 12 (ref 5–15)
BUN: 34 mg/dL — AB (ref 6–20)
CALCIUM: 8.4 mg/dL — AB (ref 8.9–10.3)
CO2: 23 mmol/L (ref 22–32)
Chloride: 102 mmol/L (ref 101–111)
Creatinine, Ser: 0.74 mg/dL (ref 0.61–1.24)
GFR calc non Af Amer: >60 mL/min (ref >60)
GLUCOSE: 120 mg/dL — AB (ref 65–99)
Potassium: 2.7 mmol/L — CL (ref 3.5–5.1)
SODIUM: 137 mmol/L (ref 135–145)
Total Bilirubin: 1.2 mg/dL (ref 0.3–1.2)
Total Protein: 8.1 g/dL (ref 6.5–8.1)

## 2016-02-14 LAB — LACTIC ACID, PLASMA: LACTIC ACID, VENOUS: 2.4 mmol/L — AB (ref 0.5–1.9)

## 2016-02-14 LAB — BLOOD GAS, VENOUS
ACID-BASE EXCESS: 1.1 mmol/L (ref 0.0–2.0)
BICARBONATE: 23.6 mmol/L (ref 20.0–28.0)
FIO2: 1
Patient temperature: 37
pCO2, Ven: 31 mmHg — ABNORMAL LOW (ref 44.0–60.0)
pH, Ven: 7.49 — ABNORMAL HIGH (ref 7.250–7.430)
pO2, Ven: <31 mmHg — CL (ref 32.0–45.0)

## 2016-02-14 LAB — PROTIME-INR
INR: 1.03
PROTHROMBIN TIME: 13.5 s (ref 11.4–15.2)

## 2016-02-14 LAB — CK: Total CK: 38 U/L — ABNORMAL LOW (ref 49–397)

## 2016-02-14 LAB — TROPONIN I

## 2016-02-14 MED ORDER — KCL IN DEXTROSE-NACL 20-5-0.45 MEQ/L-%-% IV SOLN
Freq: Once | INTRAVENOUS | Status: AC
  Administered 2016-02-14: 1000 mL via INTRAVENOUS
  Filled 2016-02-14: qty 1000

## 2016-02-14 MED ORDER — SODIUM CHLORIDE 0.9 % IV BOLUS (SEPSIS)
1000.0000 mL | Freq: Once | INTRAVENOUS | Status: AC
Start: 2016-02-14 — End: 2016-02-14
  Administered 2016-02-14: 1000 mL via INTRAVENOUS

## 2016-02-14 MED ORDER — PIPERACILLIN-TAZOBACTAM 3.375 G IVPB 30 MIN
3.3750 g | Freq: Once | INTRAVENOUS | Status: AC
  Administered 2016-02-14: 3.375 g via INTRAVENOUS
  Filled 2016-02-14: qty 50

## 2016-02-14 MED ORDER — SODIUM CHLORIDE 0.9 % IV BOLUS (SEPSIS)
250.0000 mL | Freq: Once | INTRAVENOUS | Status: AC
  Administered 2016-02-14: 250 mL via INTRAVENOUS

## 2016-02-14 MED ORDER — VANCOMYCIN HCL IN DEXTROSE 1-5 GM/200ML-% IV SOLN
1000.0000 mg | Freq: Once | INTRAVENOUS | Status: AC
  Administered 2016-02-14: 1000 mg via INTRAVENOUS
  Filled 2016-02-14: qty 200

## 2016-02-14 MED ORDER — SODIUM CHLORIDE 0.9 % IV BOLUS (SEPSIS)
500.0000 mL | Freq: Once | INTRAVENOUS | Status: AC
  Administered 2016-02-14: 500 mL via INTRAVENOUS

## 2016-02-14 NOTE — ED Notes (Addendum)
MD notified of critical lab values (K+ 2.7) and (LA 2.4)

## 2016-02-14 NOTE — ED Provider Notes (Addendum)
[facility] Emergency Department Provider Note  Time seen: 11:09 PM  I have reviewed the triage vital signs and the nursing notes.   HISTORY  Chief Complaint Shortness of Breath and Weakness    HPI Ryan Rush is a 55 y.o. male with a past medical history of hypertension, tongue cancer/stage IV, who presents to the emergency department with generalized weakness. According to family and EMS report the patient had not been heard from several days so a family member went to the house and found the patient largely unresponsive, having difficulty talking and speaking, appeared to be very short of breath. EMS was called very low oxygen saturation, per report in the 70s on room air placed on a nonrebreather mask currently satting 95% on a nonrebreather mass. Patient is able to speak in short one to 2 word sentences. He is tachypneic. Very tachycardic. Extremely dry mucous membranes and states he has been bleeding in the back of his mouth today. Patient is currently undergoing chemotherapy for his cancer, his last treatment was approximately 5 days ago.  Past Medical History:  Diagnosis Date  . Hypertension   . 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 Tobacco use     Patient Active Problem List   Diagnosis Date Noted  . Symptomatic carotid artery stenosis 01/20/2016  . Cancer, metastatic to bone ([place]) 01/15/2016  . Neoplasm related pain 12/24/2015  . Peripheral neuropathy due to chemotherapy ([place]) 12/04/2015  . CVA (cerebral infarction) 11/09/2015  . 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 PERIPHERAL VASCULAR CATHETERIZATION N/A 08/18/2015   Procedure: Glori Luis Cath Insertion;  Surgeon: Katha Cabal, MD;  Location: [facility];  Service: Cardiovascular;  Laterality: N/A;  . PERIPHERAL VASCULAR CATHETERIZATION Right 01/20/2016   Procedure: Carotid PTA/Stent Intervention;  Surgeon: Katha Cabal, MD;  Location: [facility] [facility];  Service: Cardiovascular;  Laterality: Right;  . PERIPHERAL VASCULAR CATHETERIZATION  01/20/2016   Procedure: Carotid Angiography;  Surgeon: Katha Cabal, MD;  Location: [facility];  Service: Cardiovascular;;    Prior to Admission medications   Medication Sig Start Date End Date Taking? Authorizing Provider  amLODipine (NORVASC) 5 MG tablet Take 1 tablet (5 mg total) by mouth daily. 08/24/15  Yes Cammie Sickle, MD  aspirin 81 MG chewable tablet Chew 1 tablet (81 mg total) by mouth daily. 11/11/15  Yes Gladstone Lighter, MD  atorvastatin (LIPITOR) 80 MG tablet Take 1 tablet (80 mg total) by mouth daily at 6 PM. 11/11/15  Yes Gladstone Lighter, MD  baclofen (LIORESAL) 10 MG tablet Take 15 mg by mouth 3 (three) times daily.   Yes Historical Provider, MD  clopidogrel (PLAVIX) 75 MG tablet Take 1 tablet (75 mg total) by mouth daily. 11/11/15  Yes Gladstone Lighter, MD  dexamethasone (DECADRON) 4 MG tablet Every 8 hours with food 02/03/16  Yes Cammie Sickle, MD  diphenoxylate-atropine (LOMOTIL) 2.5-0.025 MG tablet Take 1 tablet by mouth 4 (four) times daily as needed for diarrhea or loose stools. Take it along with immodium 09/09/15  Yes Cammie Sickle, MD  gabapentin (NEURONTIN) 300 MG capsule Take 2 capsules (600 mg total) by mouth 3 (three) times daily. 12/04/15  Yes Cammie Sickle, MD  HYDROcodone-acetaminophen (NORCO/VICODIN) 5-325 MG tablet Take 1 tablet by mouth every 4 (four) hours as needed for moderate  pain. Patient taking differently: Take 1 tablet by mouth every 6 (six) hours as needed for moderate pain.  01/14/16  Yes Cammie Sickle, MD  loperamide (IMODIUM) 2 MG capsule Take 1 capsule (2 mg total) by mouth as needed for diarrhea or loose stools. 09/09/15  Yes Cammie Sickle, MD  magic mouthwash w/lidocaine SOLN Take 5 mLs by mouth 4 (four) times  daily as needed for mouth pain. 09/24/15  Yes Cammie Sickle, MD  morphine (MS CONTIN) 30 MG 12 hr tablet Take 1 tablet (30 mg total) by mouth every 8 (eight) hours. Patient taking differently: Take 30 mg by mouth every 12 (twelve) hours.  01/14/16  Yes Cammie Sickle, MD  ondansetron (ZOFRAN) 8 MG tablet Take 1 tablet (8 mg total) by mouth every 8 (eight) hours as needed for nausea or vomiting. 09/09/15  Yes Cammie Sickle, MD  pantoprazole (PROTONIX) 40 MG tablet Take 1 tablet (40 mg total) by mouth daily. 08/24/15  Yes Cammie Sickle, MD  prochlorperazine (COMPAZINE) 10 MG tablet Take 1 tablet (10 mg total) by mouth every 6 (six) hours as needed for nausea or vomiting. 09/09/15  Yes Cammie Sickle, MD  ranitidine (ZANTAC) 75 MG tablet Take 75 mg by mouth daily.   Yes Historical Provider, MD  traZODone (DESYREL) 50 MG tablet Take 50 mg by mouth at bedtime.   Yes Historical Provider, MD    No Known Allergies  Family History  Problem Relation Age of Onset  . Cancer Mother     metastasized    Social History Social History  Substance Use Topics  . Smoking status: Current Every Day Smoker    Packs/day: 1.00    Types: Cigarettes  . Smokeless tobacco: Never Used  . Alcohol use No     Comment: occasional    Review of Systems Constitutional: Negative for fever.Positive for generalized weakness. Cardiovascular: Negative for chest pain. Respiratory: Positive for shortness of breath. Gastrointestinal: Negative for abdominal pain Musculoskeletal: Negative for back pain. Neurological: Negative for headache 10-point ROS otherwise negative.  ____________________________________________   PHYSICAL EXAM:  VITAL SIGNS: ED Triage Vitals  Enc Vitals Group     BP 02/14/16 2040 (!) 172/95     Pulse Rate 02/14/16 2040 (!) 137     Resp 02/14/16 2040 (!) 34     Temp 02/14/16 2040 97 F (36.1 C)     Temp Source 02/14/16 2040 Axillary     SpO2 02/14/16 2035 93 %      Weight 02/14/16 2041 117 lb 11.6 oz (53.4 kg)     Height --      Head Circumference --      Peak Flow --      Pain Score --      Pain Loc --      Pain Edu? --      Excl. in [place]? --    Constitutional: Alert and oriented. Moderate distress due to dyspnea. Eyes: 2-3 mm PERRL, sunken appearance. ENT   Head: Normocephalic and atraumatic   Mouth/Throat: Extremely dry mucous membranes, dried blood over lips and mouth. Cardiovascular: Normal rate, regular rhythm.  Respiratory: Moderate tachypnea, significant rhonchi bilaterally. Gastrointestinal: Soft and nontender. No distention.  Musculoskeletal: Nontender with normal range of motion in all extremities. 10/frail Neurologic:  One to 2 word sentences due to dyspnea. No gross deficit. Skin:  Skin is warm, dry and intact.  Psychiatric: Mood and affect are normal.   ____________________________________________    EKG  EKG reviewed and interpreted by myself shows sinus tachycardia 131 bpm, narrow QRS, normal axis, large sig normal intervals. Nonspecific ST changes. No obvious ST elevation.  ____________________________________________    RADIOLOGY  Mild non consolidative interstitial coarsening in the bases, left  greater than right. This is probably unchanged or perhaps slightly  decreased compared to the CT of 02/02/2016. No airspace  consolidation. No large effusion.     ____________________________________________   INITIAL IMPRESSION / ASSESSMENT AND PLAN / ED COURSE  Pertinent labs & imaging results that were available during my care of the patient were reviewed by me and considered in my medical decision making (see chart for details).  Patient presents with moderate distress, moderate dyspnea, very dry mucous membranes dry and blood in the mouth. No obvious source of bleeding identified on exam. Patient has diffuse rhonchi in his lungs. Tachypnea with a very low PO2 on VBG. Requiring nonrebreather to sat above 90%.  Patient's white blood cell count currently 2.8. Potassium 2.7. We will replete potassium. Continue with IV fluids. Patient will require admission for further treatment.  I started the patient on empiric antibiotics as he meets sepsis criteria with a low white blood cell count, tachypnea, generalized weakness cough and rhonchi.  Chest x-ray is read as no acute change however given his diffuse rhonchi on exam with significant hypoxia highly suspect pneumonia/pneumonitis.   Record review shows likely aspiration pneumonia on 8/29 chest CT.  likely worsening pneumonia leading to today's presentation.  CRITICAL CARE Performed by: Harvest Dark   Total critical care time: 30 minutes  Critical care time was exclusive of separately billable procedures and treating other patients.  Critical care was necessary to treat or prevent imminent or life-threatening deterioration.  Critical care was time spent personally by me on the following activities: development of treatment plan with patient and/or surrogate as well as nursing, discussions with consultants, evaluation of patient's response to treatment, examination of patient, obtaining history from patient or surrogate, ordering and performing treatments and interventions, ordering and review of laboratory studies, ordering and review of radiographic studies, pulse oximetry and re-evaluation of patient's condition.  ____________________________________________   FINAL CLINICAL IMPRESSION(S) / ED DIAGNOSES  Sepsis Generalized weakness Pneumonia   Harvest Dark, MD 02/14/16 2324    Harvest Dark, MD 02/14/16 2330

## 2016-02-15 DIAGNOSIS — Z7902 Long term (current) use of antithrombotics/antiplatelets: Secondary | ICD-10-CM | POA: Diagnosis not present

## 2016-02-15 DIAGNOSIS — Z7952 Long term (current) use of systemic steroids: Secondary | ICD-10-CM | POA: Diagnosis not present

## 2016-02-15 DIAGNOSIS — C787 Secondary malignant neoplasm of liver and intrahepatic bile duct: Secondary | ICD-10-CM | POA: Diagnosis present

## 2016-02-15 DIAGNOSIS — J9601 Acute respiratory failure with hypoxia: Secondary | ICD-10-CM | POA: Diagnosis present

## 2016-02-15 DIAGNOSIS — J690 Pneumonitis due to inhalation of food and vomit: Secondary | ICD-10-CM | POA: Diagnosis present

## 2016-02-15 DIAGNOSIS — Z789 Other specified health status: Secondary | ICD-10-CM | POA: Diagnosis not present

## 2016-02-15 DIAGNOSIS — Z681 Body mass index (BMI) 19 or less, adult: Secondary | ICD-10-CM | POA: Diagnosis not present

## 2016-02-15 DIAGNOSIS — E876 Hypokalemia: Secondary | ICD-10-CM | POA: Diagnosis present

## 2016-02-15 DIAGNOSIS — C01 Malignant neoplasm of base of tongue: Secondary | ICD-10-CM

## 2016-02-15 DIAGNOSIS — K121 Other forms of stomatitis: Secondary | ICD-10-CM | POA: Diagnosis present

## 2016-02-15 DIAGNOSIS — Z590 Homelessness: Secondary | ICD-10-CM | POA: Diagnosis not present

## 2016-02-15 DIAGNOSIS — R0602 Shortness of breath: Secondary | ICD-10-CM | POA: Diagnosis not present

## 2016-02-15 DIAGNOSIS — Z8673 Personal history of transient ischemic attack (TIA), and cerebral infarction without residual deficits: Secondary | ICD-10-CM | POA: Diagnosis not present

## 2016-02-15 DIAGNOSIS — R4182 Altered mental status, unspecified: Secondary | ICD-10-CM

## 2016-02-15 DIAGNOSIS — F17210 Nicotine dependence, cigarettes, uncomplicated: Secondary | ICD-10-CM | POA: Diagnosis present

## 2016-02-15 DIAGNOSIS — A419 Sepsis, unspecified organism: Secondary | ICD-10-CM | POA: Diagnosis not present

## 2016-02-15 DIAGNOSIS — E860 Dehydration: Secondary | ICD-10-CM | POA: Diagnosis present

## 2016-02-15 DIAGNOSIS — R0682 Tachypnea, not elsewhere classified: Secondary | ICD-10-CM | POA: Diagnosis present

## 2016-02-15 DIAGNOSIS — I10 Essential (primary) hypertension: Secondary | ICD-10-CM

## 2016-02-15 DIAGNOSIS — Z66 Do not resuscitate: Secondary | ICD-10-CM | POA: Diagnosis present

## 2016-02-15 DIAGNOSIS — Z515 Encounter for palliative care: Secondary | ICD-10-CM | POA: Diagnosis present

## 2016-02-15 DIAGNOSIS — Z7982 Long term (current) use of aspirin: Secondary | ICD-10-CM | POA: Diagnosis not present

## 2016-02-15 DIAGNOSIS — M21372 Foot drop, left foot: Secondary | ICD-10-CM | POA: Diagnosis present

## 2016-02-15 DIAGNOSIS — R531 Weakness: Secondary | ICD-10-CM | POA: Diagnosis not present

## 2016-02-15 DIAGNOSIS — J9690 Respiratory failure, unspecified, unspecified whether with hypoxia or hypercapnia: Secondary | ICD-10-CM

## 2016-02-15 DIAGNOSIS — E43 Unspecified severe protein-calorie malnutrition: Secondary | ICD-10-CM | POA: Diagnosis present

## 2016-02-15 DIAGNOSIS — C7951 Secondary malignant neoplasm of bone: Secondary | ICD-10-CM | POA: Diagnosis present

## 2016-02-15 DIAGNOSIS — T451X5A Adverse effect of antineoplastic and immunosuppressive drugs, initial encounter: Secondary | ICD-10-CM | POA: Diagnosis present

## 2016-02-15 DIAGNOSIS — E785 Hyperlipidemia, unspecified: Secondary | ICD-10-CM | POA: Diagnosis present

## 2016-02-15 LAB — URINALYSIS COMPLETE WITH MICROSCOPIC (ARMC ONLY)
Bacteria, UA: NONE SEEN
Bilirubin Urine: NEGATIVE
Glucose, UA: NEGATIVE mg/dL
Hgb urine dipstick: NEGATIVE
Ketones, ur: NEGATIVE mg/dL
Leukocytes, UA: NEGATIVE
Nitrite: NEGATIVE
Protein, ur: NEGATIVE mg/dL
Specific Gravity, Urine: 1.016 (ref 1.005–1.030)
pH: 6 (ref 5.0–8.0)

## 2016-02-15 LAB — TSH: TSH: 0.799 u[IU]/mL (ref 0.350–4.500)

## 2016-02-15 LAB — LACTIC ACID, PLASMA: LACTIC ACID, VENOUS: 1.4 mmol/L (ref 0.5–1.9)

## 2016-02-15 LAB — BASIC METABOLIC PANEL
ANION GAP: 5 (ref 5–15)
BUN: 16 mg/dL (ref 6–20)
CALCIUM: 6.6 mg/dL — AB (ref 8.9–10.3)
CO2: 20 mmol/L — AB (ref 22–32)
Chloride: 110 mmol/L (ref 101–111)
Creatinine, Ser: 0.63 mg/dL (ref 0.61–1.24)
GFR calc non Af Amer: >60 mL/min (ref >60)
Glucose, Bld: 112 mg/dL — ABNORMAL HIGH (ref 65–99)
Potassium: 3.9 mmol/L (ref 3.5–5.1)
Sodium: 135 mmol/L (ref 135–145)

## 2016-02-15 LAB — HEMOGLOBIN A1C: Hgb A1c MFr Bld: 6.3 % — ABNORMAL HIGH (ref 4.0–6.0)

## 2016-02-15 MED ORDER — MAGIC MOUTHWASH W/LIDOCAINE: 5.0000 mL | Freq: Four times a day (QID) | ORAL | Status: DC | PRN

## 2016-02-15 MED ORDER — DOCUSATE SODIUM 100 MG PO CAPS
100.0000 mg | ORAL_CAPSULE | Freq: Two times a day (BID) | ORAL | Status: DC
  Filled 2016-02-15 (×2): qty 1

## 2016-02-15 MED ORDER — FAMOTIDINE 20 MG PO TABS
10.0000 mg | ORAL_TABLET | Freq: Two times a day (BID) | ORAL | Status: DC
  Filled 2016-02-15 (×2): qty 1

## 2016-02-15 MED ORDER — PROCHLORPERAZINE EDISYLATE 5 MG/ML IJ SOLN: 10.0000 mg | INTRAMUSCULAR | Status: DC | PRN

## 2016-02-15 MED ORDER — FENTANYL 25 MCG/HR TD PT72
25.0000 ug | MEDICATED_PATCH | TRANSDERMAL | Status: DC
  Administered 2016-02-15: 25 ug via TRANSDERMAL
  Filled 2016-02-15: qty 1

## 2016-02-15 MED ORDER — POTASSIUM CHLORIDE 20 MEQ PO PACK
40.0000 meq | PACK | ORAL | Status: DC
  Filled 2016-02-15: qty 2

## 2016-02-15 MED ORDER — ATORVASTATIN CALCIUM 20 MG PO TABS: 80.0000 mg | ORAL_TABLET | Freq: Every day | ORAL | Status: DC

## 2016-02-15 MED ORDER — DEXAMETHASONE 4 MG PO TABS
4.0000 mg | ORAL_TABLET | Freq: Three times a day (TID) | ORAL | Status: DC
  Administered 2016-02-15: 4 mg via ORAL
  Filled 2016-02-15: qty 1

## 2016-02-15 MED ORDER — GABAPENTIN 300 MG PO CAPS
600.0000 mg | ORAL_CAPSULE | Freq: Three times a day (TID) | ORAL | Status: DC
  Filled 2016-02-15: qty 2

## 2016-02-15 MED ORDER — ONDANSETRON HCL 4 MG/2ML IJ SOLN: 4.0000 mg | Freq: Four times a day (QID) | INTRAMUSCULAR | Status: DC | PRN

## 2016-02-15 MED ORDER — ONDANSETRON HCL 4 MG PO TABS: 4.0000 mg | ORAL_TABLET | Freq: Four times a day (QID) | ORAL | Status: DC | PRN

## 2016-02-15 MED ORDER — ENOXAPARIN SODIUM 40 MG/0.4ML ~~LOC~~ SOLN
40.0000 mg | SUBCUTANEOUS | Status: DC
  Filled 2016-02-15: qty 0.4

## 2016-02-15 MED ORDER — PANTOPRAZOLE SODIUM 40 MG PO TBEC
40.0000 mg | DELAYED_RELEASE_TABLET | Freq: Every day | ORAL | Status: DC
  Filled 2016-02-15: qty 1

## 2016-02-15 MED ORDER — ASPIRIN 81 MG PO CHEW
81.0000 mg | CHEWABLE_TABLET | Freq: Every day | ORAL | Status: DC
  Filled 2016-02-15: qty 1

## 2016-02-15 MED ORDER — ACETAMINOPHEN 325 MG PO TABS: 650.0000 mg | ORAL_TABLET | Freq: Four times a day (QID) | ORAL | Status: DC | PRN

## 2016-02-15 MED ORDER — AMLODIPINE BESYLATE 5 MG PO TABS
5.0000 mg | ORAL_TABLET | Freq: Every day | ORAL | Status: DC
  Filled 2016-02-15: qty 1

## 2016-02-15 MED ORDER — HYDROCODONE-ACETAMINOPHEN 5-325 MG PO TABS: 1.0000 | ORAL_TABLET | ORAL | Status: DC | PRN

## 2016-02-15 MED ORDER — CLOPIDOGREL BISULFATE 75 MG PO TABS
75.0000 mg | ORAL_TABLET | Freq: Every day | ORAL | Status: DC
  Filled 2016-02-15: qty 1

## 2016-02-15 MED ORDER — VANCOMYCIN HCL IN DEXTROSE 750-5 MG/150ML-% IV SOLN
750.0000 mg | Freq: Two times a day (BID) | INTRAVENOUS | Status: DC
  Administered 2016-02-15: 750 mg via INTRAVENOUS
  Filled 2016-02-15 (×3): qty 150

## 2016-02-15 MED ORDER — LOPERAMIDE HCL 2 MG PO CAPS: 2.0000 mg | ORAL_CAPSULE | ORAL | Status: DC | PRN

## 2016-02-15 MED ORDER — BACLOFEN 10 MG PO TABS
15.0000 mg | ORAL_TABLET | Freq: Three times a day (TID) | ORAL | Status: DC
  Filled 2016-02-15: qty 2

## 2016-02-15 MED ORDER — POTASSIUM CHLORIDE IN NACL 40-0.9 MEQ/L-% IV SOLN
INTRAVENOUS | Status: DC
  Administered 2016-02-15 (×2): 125 mL/h via INTRAVENOUS
  Administered 2016-02-15 – 2016-02-16 (×2): 100 mL/h via INTRAVENOUS
  Filled 2016-02-15 (×6): qty 1000

## 2016-02-15 MED ORDER — ACETAMINOPHEN 650 MG RE SUPP: 650.0000 mg | Freq: Four times a day (QID) | RECTAL | Status: DC | PRN

## 2016-02-15 MED ORDER — TRAZODONE HCL 50 MG PO TABS: 50.0000 mg | ORAL_TABLET | Freq: Every day | ORAL | Status: DC

## 2016-02-15 MED ORDER — MORPHINE SULFATE ER 30 MG PO TBCR
30.0000 mg | EXTENDED_RELEASE_TABLET | Freq: Three times a day (TID) | ORAL | Status: DC
  Administered 2016-02-15: 07:00:00 30 mg via ORAL
  Filled 2016-02-15: qty 1

## 2016-02-15 MED ORDER — LIDOCAINE VISCOUS 2 % MT SOLN
5.0000 mL | Freq: Four times a day (QID) | OROMUCOSAL | Status: DC | PRN
  Administered 2016-02-15: 08:00:00 5 mL via OROMUCOSAL
  Filled 2016-02-15 (×2): qty 5

## 2016-02-15 MED ORDER — ORAL CARE MOUTH RINSE
15.0000 mL | Freq: Two times a day (BID) | OROMUCOSAL | Status: DC
  Administered 2016-02-15 – 2016-02-16 (×2): 15 mL via OROMUCOSAL

## 2016-02-15 MED ORDER — MORPHINE SULFATE (PF) 2 MG/ML IV SOLN: 2.0000 mg | INTRAVENOUS | Status: DC | PRN

## 2016-02-15 MED ORDER — CHLORHEXIDINE GLUCONATE 0.12 % MT SOLN
15.0000 mL | Freq: Two times a day (BID) | OROMUCOSAL | Status: DC
  Administered 2016-02-15: 15 mL via OROMUCOSAL
  Filled 2016-02-15: qty 15

## 2016-02-15 MED ORDER — PIPERACILLIN-TAZOBACTAM 4.5 G IVPB
4.5000 g | Freq: Three times a day (TID) | INTRAVENOUS | Status: DC
  Administered 2016-02-15 – 2016-02-16 (×4): 4.5 g via INTRAVENOUS
  Filled 2016-02-15 (×6): qty 100

## 2016-02-15 MED ORDER — MAGIC MOUTHWASH
5.0000 mL | Freq: Four times a day (QID) | ORAL | Status: DC | PRN
  Administered 2016-02-15: 5 mL via ORAL
  Filled 2016-02-15: qty 10

## 2016-02-15 NOTE — Progress Notes (Signed)
Patient not able to tolerate PO medications at this time. Attempted twice. Performed mouth care. Madlyn Frankel, RN

## 2016-02-15 NOTE — Progress Notes (Signed)
[place] [facility] [place] NAME: Ryan Rush    MR#:  [id number]  DATE OF BIRTH:  03/17/1961  SUBJECTIVE:  CHIEF COMPLAINT:   Chief Complaint  Patient presents with  . Shortness of Breath  . Weakness   - Metastatic head and neck cancer, progressive in spite of chemotherapy. -Unable to open his mouth at this time.  REVIEW OF SYSTEMS:  Review of Systems  Constitutional: Positive for malaise/fatigue. Negative for chills and fever.  HENT: Positive for congestion and sore throat.   Respiratory: Positive for shortness of breath. Negative for cough and wheezing.   Cardiovascular: Negative for chest pain, palpitations and leg swelling.  Gastrointestinal: Negative for abdominal pain, constipation, diarrhea, nausea and vomiting.  Genitourinary: Negative for dysuria.  Musculoskeletal: Positive for back pain, joint pain, myalgias and neck pain.  Neurological: Positive for weakness. Negative for dizziness, speech change, focal weakness, seizures and headaches.  Psychiatric/Behavioral: Negative for depression.    DRUG ALLERGIES:  No Known Allergies  VITALS:  Blood pressure 124/70, pulse 79, temperature 98 F (36.7 C), temperature source Oral, resp. rate 18, height 5\' 8"  (1.727 m), weight 53.1 kg (117 lb), SpO2 100 %.  PHYSICAL EXAMINATION:  Physical Exam  GENERAL:  55 y.o.-year-old patient lying in the bed with no acute distress. Appears chronically ill EYES: Pupils equal, round, reactive to light and accommodation. No scleral icterus. Extraocular muscles intact.  HEENT: Head atraumatic, normocephalic. There is significant crusting all around the mouth, patient has significant pain and cannot open the mouth completely. There is discharge drooling from the sites. NECK:  Supple, no jugular venous distention. No thyroid enlargement, no tenderness.  LUNGS: Normal breath sounds bilaterally, no wheezing, rales,rhonchi or crepitation. No use of  accessory muscles of respiration. Decreased bibasilar breath sounds. CARDIOVASCULAR: S1, S2 normal. No rubs, or gallops. 2/6 systolic murmur is present ABDOMEN: Soft, nontender, nondistended. Bowel sounds present. No organomegaly or mass.  EXTREMITIES: No pedal edema, cyanosis, or clubbing.  NEUROLOGIC: Cranial nerves II through XII are intact. Muscle strength 5/5 in all extremities. Global weakness. Sensation intact. Gait not checked.  PSYCHIATRIC: The patient is alert and oriented x 3.  SKIN: No obvious rash, lesion, or ulcer.    LABORATORY PANEL:   CBC  Recent Labs Lab 02/14/16 2154  WBC 2.8*  HGB 13.8  HCT 41.0  PLT 198   ------------------------------------------------------------------------------------------------------------------  Chemistries   Recent Labs Lab 02/14/16 2154  NA 137  K 2.7*  CL 102  CO2 23  GLUCOSE 120*  BUN 34*  CREATININE 0.74  CALCIUM 8.4*  AST 25  ALT 22  ALKPHOS 75  BILITOT 1.2   ------------------------------------------------------------------------------------------------------------------  Cardiac Enzymes  Recent Labs Lab 02/14/16 2154  TROPONINI <0.03   ------------------------------------------------------------------------------------------------------------------  RADIOLOGY:  Dg Chest Port 1 View  Result Date: 02/14/2016 CLINICAL DATA:  Unable to the eat due to sores in mouth and pain with swallowing, duration 1 week. EXAM: PORTABLE CHEST 1 VIEW COMPARISON:  02/02/2016, 08/03/2015. FINDINGS: Slight coarsened markings in the bases, left greater than right, likely representing the same lungs based disease process observed on the recent CT. No confluent airspace consolidation. No large effusion. Normal pulmonary vasculature. Unremarkable hilar and mediastinal contours. Right jugular port appears satisfactorily positioned. IMPRESSION: Mild non consolidative interstitial coarsening in the bases, left greater than right. This is  probably unchanged or perhaps slightly decreased compared to the CT of 02/02/2016. No airspace consolidation. No large effusion. Electronically Signed   By: Quillian Quince  Armandina Stammer M.D.   On: 02/14/2016 21:54    EKG:   Orders placed or performed during the hospital encounter of 02/14/16  . ED EKG 12-Lead  . ED EKG 12-Lead    ASSESSMENT AND PLAN:   55 year old male with metastatic squamous cell carcinoma of the base of the tongue with metastases to liver, bone in spite of third line chemotherapy, recent admission with stroke and right carotid stent placement, hypertension and smoking presents to hospital secondary to difficulty breathing and weakness.  #1 aspiration pneumonia-on full liquid diet, but has significant abdominal fascia and trouble opening not secondary to his metastatic cancer. -Continue IV hydration. Discontinue vancomycin. -Continue Zosyn at this time. -We'll continue the diet as patient has extremely poor prognosis and probably will be a hospice patient.  #2 hypokalemia-being replaced through IV fluids. Cannot take oral potassium. 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 Patient has progressive disease in spite of third line chemotherapy -Since he cannot take oral pain medications, will place on fentanyl patch. Also IV pain medication as needed. -Given his poor tolerance and declining performance status, palliative care consultation has been requested. -Discussed with patient in detail who refuses a feeding tube at this time and also wanted to be DNR -Palliative care meeting with family tomorrow. -Discontinue other oral medications as patient is unable to take them at this time.  #4 history of CVA-recent right carotid stent placement. -Again statin, aspirin and Plavix had to be held as patient is unable to take any oral medications at this time.  #5 DVT prophylaxis-on Lovenox    All the records are  reviewed and case discussed with Care Management/Social Workerr. Management plans discussed with the patient, family and they are in agreement.  CODE STATUS: DO NOT RESUSCITATE  TOTAL TIME TAKING CARE OF THIS PATIENT: 38 minutes.   POSSIBLE D/C IN 1-2 DAYS, DEPENDING ON CLINICAL CONDITION.   Gladstone Lighter M.D on 02/15/2016 at 5:16 PM  Between 7am to 6pm - Pager - [phone]  After 6pm go to [email or web address] - password [facility]  Office  [phone]  CC: Primary care physician; Kirk Ruths., MD

## 2016-02-15 NOTE — Progress Notes (Addendum)
CSW is familiar with patient. Patient was discharged from [facility] on 11/11/15 to [facility]. At that time patient was on a LOG for STR. [place] assisted patient with obtaining Medicaid in hopes that patient would transition to LTC and sign over his Disability check (SSI) to [facility]. Originally patient agreed but eventually refused. He was discharged to his apartment a few weeks ago where he lives with his girlfriend. Patient's address on his facesheet is incorrect. CSW confirmed the above information with [place]- Development worker, international aid at [facility]. There are no CSW needs at this time. CSW is available if a need were to arise.  Ernest Pine, MSW, LCSW, [place] Clinical Social Worker [phone]

## 2016-02-15 NOTE — Progress Notes (Signed)
Palliative Medicine Consult Order Noted. Due to high referral volume and limited staffing, there will be a delay seeing this patient. Please call the Palliative Medicine Team office at [phone] if recommendations are needed in the interim.  Thank you for inviting [place] to see this patient.  Marjie Skiff Danarius Mcconathy, RN, BSN, [facility] 02/15/2016 2:28 PM Cell [phone] 8:00-4:00 Monday-Friday Office [phone]

## 2016-02-15 NOTE — H&P (Signed)
Ryan Rush is an 55 y.o. male.   Chief Complaint: Shortness of breath HPI: The patient with past medical history of metastatic squamous cell tongue cancer presents emergency department complaining of shortness of breath. He completed his third round of chemotherapy 4 days ago and has been unable to eat very much since that time. He has difficulty spitting due to generalized weakness and oral pain. He recently underwent a CT of the chest due to cough which showed some acute on chronic inflammation suspected to be related to aspiration. He reports starting some antibiotics but it is unclear if he has finished them due to his inability to swallow without significant pain. In the emergency department he was found to be hypoxic with an elevated lactic acid which prompted the emergency department staff to call the hospitalist service for admission  Past Medical History:  Diagnosis Date  . 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 Hypertension   . 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 Tobacco use     Past Surgical History:  Procedure Laterality Date  . PERIPHERAL VASCULAR CATHETERIZATION N/A 08/18/2015   Procedure: Glori Luis Cath Insertion;  Surgeon: Katha Cabal, MD;  Location: [facility];  Service: Cardiovascular;  Laterality: N/A;  . PERIPHERAL VASCULAR CATHETERIZATION Right 01/20/2016   Procedure: Carotid PTA/Stent Intervention;  Surgeon: Katha Cabal, MD;  Location: [facility];  Service: Cardiovascular;  Laterality: Right;  . PERIPHERAL VASCULAR CATHETERIZATION  01/20/2016   Procedure: Carotid Angiography;  Surgeon: Katha Cabal, MD;  Location: [facility];  Service: Cardiovascular;;    Family History  Problem Relation Age of Onset  . Cancer Mother     metastasized   Social History:  reports that he has been smoking Cigarettes.  He has been smoking about 1.00 pack per day. He has never used smokeless tobacco. He  reports that he does not drink alcohol or use drugs.  Allergies: No Known Allergies  Medications Prior to Admission  Medication Sig Dispense Refill  . amLODipine (NORVASC) 5 MG tablet Take 1 tablet (5 mg total) by mouth daily. 60 tablet 1  . aspirin 81 MG chewable tablet Chew 1 tablet (81 mg total) by mouth daily. 30 tablet 2  . atorvastatin (LIPITOR) 80 MG tablet Take 1 tablet (80 mg total) by mouth daily at 6 PM. 30 tablet 2  . baclofen (LIORESAL) 10 MG tablet Take 15 mg by mouth 3 (three) times daily.    . clopidogrel (PLAVIX) 75 MG tablet Take 1 tablet (75 mg total) by mouth daily. 30 tablet 2  . dexamethasone (DECADRON) 4 MG tablet Every 8 hours with food 60 tablet 0  . diphenoxylate-atropine (LOMOTIL) 2.5-0.025 MG tablet Take 1 tablet by mouth 4 (four) times daily as needed for diarrhea or loose stools. Take it along with immodium 30 tablet 0  . gabapentin (NEURONTIN) 300 MG capsule Take 2 capsules (600 mg total) by mouth 3 (three) times daily. 180 capsule 3  . HYDROcodone-acetaminophen (NORCO/VICODIN) 5-325 MG tablet Take 1 tablet by mouth every 4 (four) hours as needed for moderate pain. (Patient taking differently: Take 1 tablet by mouth every 6 (six) hours as needed for moderate pain. ) 120 tablet 0  . loperamide (IMODIUM) 2 MG capsule Take 1 capsule (2 mg total) by mouth as needed for diarrhea or loose stools. 30 capsule 0  . magic mouthwash w/lidocaine SOLN Take 5 mLs  by mouth 4 (four) times daily as needed for mouth pain. 250 mL 3  . morphine (MS CONTIN) 30 MG 12 hr tablet Take 1 tablet (30 mg total) by mouth every 8 (eight) hours. (Patient taking differently: Take 30 mg by mouth every 12 (twelve) hours. ) 65 tablet 0  . ondansetron (ZOFRAN) 8 MG tablet Take 1 tablet (8 mg total) by mouth every 8 (eight) hours as needed for nausea or vomiting. 30 tablet 0  . pantoprazole (PROTONIX) 40 MG tablet Take 1 tablet (40 mg total) by mouth daily. 60 tablet 3  . prochlorperazine (COMPAZINE) 10  MG tablet Take 1 tablet (10 mg total) by mouth every 6 (six) hours as needed for nausea or vomiting. 60 tablet 0  . ranitidine (ZANTAC) 75 MG tablet Take 75 mg by mouth daily.    . traZODone (DESYREL) 50 MG tablet Take 50 mg by mouth at bedtime.      Results for orders placed or performed during the hospital encounter of 02/14/16 (from the past 48 hour(s))  Comprehensive metabolic panel     Status: Abnormal   Collection Time: 02/14/16  9:54 PM  Result Value Ref Range   Sodium 137 135 - 145 mmol/L   Potassium 2.7 (LL) 3.5 - 5.1 mmol/L    Comment: CRITICAL RESULT CALLED TO, READ BACK BY AND VERIFIED WITH DAVID WALKER AT 2230 02/14/16.PMH   Chloride 102 101 - 111 mmol/L   CO2 23 22 - 32 mmol/L   Glucose, Bld 120 (H) 65 - 99 mg/dL   BUN 34 (H) 6 - 20 mg/dL   Creatinine, Ser 0.74 0.61 - 1.24 mg/dL   Calcium 8.4 (L) 8.9 - 10.3 mg/dL   Total Protein 8.1 6.5 - 8.1 g/dL   Albumin 3.4 (L) 3.5 - 5.0 g/dL   AST 25 15 - 41 U/L   ALT 22 17 - 63 U/L   Alkaline Phosphatase 75 38 - 126 U/L   Total Bilirubin 1.2 0.3 - 1.2 mg/dL   GFR calc non Af Amer >60 >60 mL/min   GFR calc Af Amer >60 >60 mL/min    Comment: (NOTE) The eGFR has been calculated using the CKD EPI equation. This calculation has not been validated in all clinical situations. eGFR's persistently <60 mL/min signify possible Chronic Kidney Disease.    Anion gap 12 5 - 15  CBC WITH DIFFERENTIAL     Status: Abnormal   Collection Time: 02/14/16  9:54 PM  Result Value Ref Range   WBC 2.8 (L) 3.8 - 10.6 K/uL   RBC 4.89 4.40 - 5.90 MIL/uL   Hemoglobin 13.8 13.0 - 18.0 g/dL   HCT 41.0 40.0 - 52.0 %   MCV 83.9 80.0 - 100.0 fL   MCH 28.3 26.0 - 34.0 pg   MCHC 33.8 32.0 - 36.0 g/dL   RDW 13.6 11.5 - 14.5 %   Platelets 198 150 - 440 K/uL   Neutrophils Relative % 83 %   Neutro Abs 2.3 1.4 - 6.5 K/uL   Lymphocytes Relative 8 %   Lymphs Abs 0.2 (L) 1.0 - 3.6 K/uL   Monocytes Relative 8 %   Monocytes Absolute 0.2 0.2 - 1.0 K/uL    Eosinophils Relative 0 %   Eosinophils Absolute 0.0 0 - 0.7 K/uL   Basophils Relative 1 %   Basophils Absolute 0.0 0 - 0.1 K/uL  Troponin I     Status: None   Collection Time: 02/14/16  9:54 PM  Result Value Ref  Range   Troponin I <0.03 <0.03 ng/mL  Protime-INR     Status: None   Collection Time: 02/14/16  9:54 PM  Result Value Ref Range   Prothrombin Time 13.5 11.4 - 15.2 seconds   INR 1.03   CK     Status: Abnormal   Collection Time: 02/14/16  9:54 PM  Result Value Ref Range   Total CK 38 (L) 49 - 397 U/L  TSH     Status: None   Collection Time: 02/14/16  9:54 PM  Result Value Ref Range   TSH 0.799 0.350 - 4.500 uIU/mL  Lactic acid, plasma     Status: Abnormal   Collection Time: 02/14/16  9:55 PM  Result Value Ref Range   Lactic Acid, Venous 2.4 (HH) 0.5 - 1.9 mmol/L    Comment: CRITICAL RESULT CALLED TO, READ BACK BY AND VERIFIED WITH DAVID WALKER AT 2230 02/14/16.PMH  Blood gas, venous ([facility], [facility], [facility])     Status: Abnormal   Collection Time: 02/14/16  9:55 PM  Result Value Ref Range   FIO2 1.00    Delivery systems NONREBREATHER    pH, Ven 7.49 (H) 7.250 - 7.430   pCO2, Ven 31 (L) 44.0 - 60.0 mmHg   pO2, Ven <31.0 (LL) 32.0 - 45.0 mmHg   Bicarbonate 23.6 20.0 - 28.0 mmol/L   Acid-Base Excess 1.1 0.0 - 2.0 mmol/L   Patient temperature 37.0    Collection site LINE    Sample type VENOUS   Lactic acid, plasma     Status: None   Collection Time: 02/15/16 12:25 AM  Result Value Ref Range   Lactic Acid, Venous 1.4 0.5 - 1.9 mmol/L   Dg Chest Port 1 View  Result Date: 02/14/2016 CLINICAL DATA:  Unable to the eat due to sores in mouth and pain with swallowing, duration 1 week. EXAM: PORTABLE CHEST 1 VIEW COMPARISON:  02/02/2016, 08/03/2015. FINDINGS: Slight coarsened markings in the bases, left greater than right, likely representing the same lungs based disease process observed on the recent CT. No confluent airspace consolidation. No large effusion. Normal pulmonary  vasculature. Unremarkable hilar and mediastinal contours. Right jugular port appears satisfactorily positioned. IMPRESSION: Mild non consolidative interstitial coarsening in the bases, left greater than right. This is probably unchanged or perhaps slightly decreased compared to the CT of 02/02/2016. No airspace consolidation. No large effusion. Electronically Signed   By: Andreas [place] M.D.   On: 02/14/2016 21:54    Review of Systems  Constitutional: Negative for chills and fever.  HENT: Negative for sore throat and tinnitus.   Eyes: Negative for blurred vision and redness.  Respiratory: Negative for cough and shortness of breath.   Cardiovascular: Negative for chest pain, palpitations, orthopnea and PND.  Gastrointestinal: Negative for abdominal pain, diarrhea, nausea and vomiting.  Genitourinary: Negative for dysuria, frequency and urgency.  Musculoskeletal: Negative for joint pain and myalgias.  Skin: Negative for rash.       No lesions  Neurological: Negative for speech change, focal weakness and weakness.  Endo/Heme/Allergies: Does not bruise/bleed easily.       No temperature intolerance  Psychiatric/Behavioral: Negative for depression and suicidal ideas.    Blood pressure (!) 144/70, pulse (!) 120, temperature 99.1 F (37.3 C), temperature source Oral, resp. rate 20, height 5' 8" (1.727 m), weight 53.1 kg (117 lb), SpO2 97 %. Physical Exam  Constitutional: He is oriented to person, place, and time. He appears well-developed and well-nourished. No distress.  HENT:  Head:  Normocephalic and atraumatic.  Oral mucosa is sloughing and bloody.  Eyes: Conjunctivae and EOM are normal. Pupils are equal, round, and reactive to light. No scleral icterus.  Neck: Normal range of motion. Neck supple. No JVD present. No tracheal deviation present. No thyromegaly present.  Cardiovascular: Normal rate, regular rhythm and normal heart sounds.   Respiratory: No respiratory distress. He has  rales.  Nonrebreather mask in place  GI: Soft. Bowel sounds are normal. He exhibits no distension. There is no tenderness.  Genitourinary:  Genitourinary Comments: Deferred  Musculoskeletal: Normal range of motion. He exhibits no edema.  Lymphadenopathy:    He has no cervical adenopathy.  Neurological: He is alert and oriented to person, place, and time. No cranial nerve deficit.  Skin: Skin is warm and dry. No rash noted. No erythema.  Psychiatric: He has a normal mood and affect. His behavior is normal. Judgment and thought content normal.     Assessment/Plan This is a 55 year old male admitted for aspiration pneumonia. 1. Aspiration pneumonia: The patient is received Zosyn in the emergency department which we will continue while hospitalized. Lactic acid has improved with intravenous hydration. 2. Sepsis: Patient meets criteria via tachycardia and tachypnea. He is hemodynamically stable. Follow blood cultures for growth and sensitivities. Add Vanc. 3. Carotid atherosclerosis: The patient underwent endarterectomy of the right carotid 11 days ago. Continue aspirin and Plavix. 4. Stomatitis: Secondary to chemotherapy for squamous cell carcinoma of the tongue. Oral care per nursing. Continue dexamethasone. 5. Essential hypertension: Continue amlodipine 6. Hyperlipidemia: Continue statin therapy 7. DVT prophylaxis: Lovenox 8. GI prophylaxis: Pantoprazole The patient is a full code. Time spent on admission orders and patient care approximately 45 minutes  Harrie Foreman, MD 02/15/2016, 6:09 AM

## 2016-02-15 NOTE — Progress Notes (Signed)
Initial Nutrition Assessment  DOCUMENTATION CODES:   Severe malnutrition in context of chronic illness, Underweight  INTERVENTION:  -Noted palliative care consult pending. Pt with tongue cancer and inability to tolerate po at present with significant wt loss and meets criteria for severe malnutrition. If aggressive interventions is wanted, recommend nutrition support. Will reassess poc on follow   NUTRITION DIAGNOSIS:   Malnutrition related to cancer and cancer related treatments, chronic illness as evidenced by percent weight loss, energy intake < or equal to 75% for > or equal to 1 month, severe depletion of muscle mass, severe depletion of body fat.  GOAL:   Patient will meet greater than or equal to 90% of their needs  MONITOR:   PO intake, Supplement acceptance  REASON FOR ASSESSMENT:   Malnutrition Screening Tool    ASSESSMENT:    55 yo male admitted with aspiration pneumonia, stomatitis due to chemo for squamous cell carcinoma of the tongue/tonsils. Pt completed his 3rd round of chemotherapy 4 days ago.  Pt unable to tolerate oral meds or po at present; pt with dried blood on and around mouth. Pt reports pain and difficulty swallowing with oral intake. Pt reports he has been unable to eat anything for the last several days but po intake has been poor for a while. Pt reports 50 pound wt loss but does not know over what time period (30% wt loss). Per weight encounters, 16% wt loss since May (4 months), 22% wt loss since March of this year. Noted pt started chemo in March of this year.   Nutrition-Focused physical exam completed. Findings are severe fat depletion, moderate to severe muscle depletion, and no edema.    Past Medical History:  Diagnosis Date  . 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 Hypertension   . 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 Tobacco use    Diet Order:  Diet full liquid Room service appropriate? No; Fluid  consistency: Thin  Skin:  Reviewed, no issues  Last BM:  9/10   Labs: potassium 2.7  Meds: NS with KCl at 125 ml/hr, magic mouthwash  Height:   Ht Readings from Last 1 Encounters:  02/15/16 5\' 8"  (1.727 m)    Weight:   Wt Readings from Last 1 Encounters:  02/15/16 117 lb (53.1 kg)    BMI:  Body mass index is 17.79 kg/m.  Estimated Nutritional Needs:   Kcal:  1850-2120 kcals  Protein:  80-105 g  Fluid:  >/= 1.8 L  EDUCATION NEEDS:   No education needs identified at this time  [place], [place], [place] [phone] Pager  [phone] Weekend/On-Call Pager

## 2016-02-15 NOTE — Progress Notes (Signed)
[facility] NOTE  Patient Care Team: Kirk Ruths, MD as PCP - General (Internal Medicine) Margaretha Sheffield, MD (Otolaryngology) Katha Cabal, MD (Vascular Surgery)  CHIEF COMPLAINTS/PURPOSE OF CONSULTATION:  Metastatic head and neck cancer.  HISTORY OF PRESENTING ILLNESS:  Ryan Rush 55 y.o.  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 Workup so far concerning for possible aspiration pneumonia. Patient is on antibiotics.  Patient feels poorly. Poor by mouth intake. Positive for nausea no vomiting. Multiple sores in the mouth. Positive for fever. Positive for shortness of breath for cough.  ROS: A complete 10 point review of system is done which is negative except mentioned above in history of present illness  MEDICAL HISTORY:  Past Medical History:  Diagnosis Date  . 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 Hypertension   . 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 Tobacco use     SURGICAL HISTORY: Past Surgical History:  Procedure Laterality Date  . PERIPHERAL VASCULAR CATHETERIZATION N/A 08/18/2015   Procedure: Glori Luis Cath Insertion;  Surgeon: Katha Cabal, MD;  Location: [facility];  Service: Cardiovascular;  Laterality: N/A;  . PERIPHERAL VASCULAR CATHETERIZATION Right 01/20/2016   Procedure: Carotid PTA/Stent Intervention;  Surgeon: Katha Cabal, MD;  Location: [facility];  Service: Cardiovascular;  Laterality: Right;  . PERIPHERAL VASCULAR CATHETERIZATION  01/20/2016   Procedure: Carotid Angiography;  Surgeon: Katha Cabal, MD;  Location: [facility];  Service: Cardiovascular;;    SOCIAL HISTORY: Patient is homeless. Currently living in a hotel. Social History    Social History  . Marital status: Single    Spouse name: N/A  . Number of children: N/A  . Years of education: N/A   Occupational History  . Not on file.   Social History Main Topics  . Smoking status: Current Every Day Smoker    Packs/day: 1.00    Types: Cigarettes  . Smokeless tobacco: Never Used  . Alcohol use No     Comment: occasional  . Drug use: No  . Sexual activity: No   Other Topics Concern  . Not on file   Social History Narrative   Independent at baseline, lives by himself      Currently a resident at [facility] and using a wheelchair    FAMILY HISTORY: Family History  Problem Relation Age of Onset  . Cancer Mother     metastasized    ALLERGIES:  has No Known Allergies.  MEDICATIONS:  Current Facility-Administered Medications  Medication Dose Route Frequency Provider Last Rate Last Dose  . 0.9 % NaCl with KCl 40 mEq / L  infusion   Intravenous Continuous Harrie Foreman, MD 125 mL/hr at 02/15/16 1321 125 mL/hr at 02/15/16 1321  . acetaminophen (TYLENOL) tablet 650 mg  650 mg Oral Q6H PRN Harrie Foreman, MD       Or  . acetaminophen (TYLENOL) suppository 650 mg  650 mg Rectal Q6H PRN Harrie Foreman, MD      . amLODipine (NORVASC) tablet 5 mg  5 mg Oral Daily Harrie Foreman, MD      . aspirin chewable tablet 81 mg  81 mg Oral Daily Harrie Foreman, MD      .  atorvastatin (LIPITOR) tablet 80 mg  80 mg Oral q1800 Harrie Foreman, MD      . baclofen (LIORESAL) tablet 15 mg  15 mg Oral TID Harrie Foreman, MD      . chlorhexidine (PERIDEX) 0.12 % solution 15 mL  15 mL Mouth Rinse BID Gladstone Lighter, MD      . clopidogrel (PLAVIX) tablet 75 mg  75 mg Oral Daily Harrie Foreman, MD      . dexamethasone (DECADRON) tablet 4 mg  4 mg Oral Q8H Harrie Foreman, MD   4 mg at 02/15/16 0636  . docusate sodium (COLACE) capsule 100 mg  100 mg Oral BID Harrie Foreman, MD      . enoxaparin (LOVENOX) injection 40 mg  40 mg Subcutaneous  Q24H Harrie Foreman, MD      . famotidine (PEPCID) tablet 10 mg  10 mg Oral BID Harrie Foreman, MD      . gabapentin (NEURONTIN) capsule 600 mg  600 mg Oral TID Harrie Foreman, MD      . HYDROcodone-acetaminophen (NORCO/VICODIN) 5-325 MG per tablet 1 tablet  1 tablet Oral Q4H PRN Harrie Foreman, MD      . magic mouthwash  5 mL Oral QID PRN Harrie Foreman, MD   5 mL at 02/15/16 0811   And  . lidocaine (XYLOCAINE) 2 % viscous mouth solution 5 mL  5 mL Mouth/Throat QID PRN Harrie Foreman, MD   5 mL at 02/15/16 0811  . loperamide (IMODIUM) capsule 2 mg  2 mg Oral PRN Harrie Foreman, MD      . MEDLINE mouth rinse  15 mL Mouth Rinse q12n4p Gladstone Lighter, MD   15 mL at 02/15/16 1535  . morphine (MS CONTIN) 12 hr tablet 30 mg  30 mg Oral Q8H Harrie Foreman, MD   30 mg at 02/15/16 0636  . ondansetron (ZOFRAN) tablet 4 mg  4 mg Oral Q6H PRN Harrie Foreman, MD       Or  . ondansetron [facility]) injection 4 mg  4 mg Intravenous Q6H PRN Harrie Foreman, MD      . pantoprazole (PROTONIX) EC tablet 40 mg  40 mg Oral Daily Harrie Foreman, MD      . piperacillin-tazobactam (ZOSYN) IVPB 4.5 g  4.5 g Intravenous Q8H Harvest Dark, MD   4.5 g at 02/15/16 1526  . potassium chloride (KLOR-CON) packet 40 mEq  40 mEq Oral STAT Harrie Foreman, MD      . prochlorperazine (COMPAZINE) injection 10 mg  10 mg Intravenous Q4H PRN Harrie Foreman, MD      . traZODone (DESYREL) tablet 50 mg  50 mg Oral QHS Harrie Foreman, MD      . vancomycin (VANCOCIN) IVPB 750 mg/150 ml premix  750 mg Intravenous Q12H Harvest Dark, MD   750 mg at 02/15/16 0811      .  PHYSICAL EXAMINATION:  Vitals:   02/15/16 0846 02/15/16 1218  BP: (!) 110/57 124/70  Pulse: 99 79  Resp: 20 18  Temp: 97.8 F (36.6 C) 98 F (36.7 C)   Filed Weights   02/14/16 2041 02/15/16 0315  Weight: 117 lb 11.6 oz (53.4 kg) 117 lb (53.1 kg)    GENERAL: Cachectic appearing poorlly nourished; Alert, Mild  respiratory distress distress and comfortable.  Accompanied by family EYES: no pallor or icterus OROPHARYNX: no thrush; dry mouth. Ulcerations noted NECK: supple, no masses  felt LYMPH: Positive for bilateral neck lymph nodes LUNGS: decreased breath sounds to auscultation at bases and  No wheeze or crackles HEART/CVS: regular rate & rhythm and no murmurs; No lower extremity edema ABDOMEN: abdomen soft, non-tender and normal bowel sounds Musculoskeletal:no cyanosis of digits and no clubbing  PSYCH: alert & oriented x 3 with fluent speech NEURO: no focal motor/sensory deficits SKIN:  no rashes or significant lesions  LABORATORY DATA:  I have reviewed the data as listed Lab Results  Component Value Date   WBC 2.8 (L) 02/14/2016   HGB 13.8 02/14/2016   HCT 41.0 02/14/2016   MCV 83.9 02/14/2016   PLT 198 02/14/2016    Recent Labs  01/14/16 0844 01/21/16 0341 02/03/16 0851 02/14/16 2154  NA 129* 135 130* 137  K 3.6 3.3* 3.4* 2.7*  CL 96* 103 98* 102  CO2 23 25 23 23   GLUCOSE 127* 114* 129* 120*  BUN 15 10 13  34*  CREATININE 0.60* 0.54* 0.56* 0.74  CALCIUM 9.2 8.5* 8.7* 8.4*  GFRNONAA >60 >60 >60 >60  GFRAA >60 >60 >60 >60  PROT 8.4*  --  8.4* 8.1  ALBUMIN 3.6  --  3.2* 3.4*  AST 30  --  38 25  ALT 16*  --  20 22  ALKPHOS 83  --  91 75  BILITOT 0.6  --  0.6 1.2    RADIOGRAPHIC STUDIES: I have personally reviewed the radiological images as listed and agreed with the findings in the report. 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 Restaging. Subsequent encounter. EXAM: CT NECK WITH CONTRAST TECHNIQUE: Multidetector CT imaging of the neck was performed using the standard protocol following the bolus administration of intravenous contrast. CONTRAST:  152mL ISOVUE-300 IOPAMIDOL (ISOVUE-300) INJECTION 61% in conjunction with contrast  enhanced imaging of the chest, abdomen, and pelvis reported separately. COMPARISON:  CTA head and neck 11/10/2015. PET-CT 10/21/2015 and earlier. FINDINGS: Pharynx and larynx: Significantly progressed since May and June indistinct heterogeneous right palatine tonsil region soft tissue mass, inseparable from malignant retropharyngeal lymphadenopathy. Estimated 3 cm size of the tonsillar mass itself (series 3, image 40) associated mass effect on the oropharynx. Mild right hypo pharyngeal mass effect. Salivary glands: Mild mass effect on both submandibular glands related to malignant lymphadenopathy. Negative sublingual space. Negative parotid glands. Thyroid: Negative; 5 mm hypodense inferior right lobe nodule which does not meet consensus criteria for follow-up. Lymph nodes: Progressed and severe bilateral malignant appearing lymphadenopathy with extracapsular extension throughout at the bilateral neck. Bilateral level 2/3 worst affected. Largest individual right side node measures 4.3 cm greatest dimension, an the largest left side node is estimated at 4.5 cm. Malignant appearing right level 1b nodes. Malignant bilateral retropharyngeal lymph nodes, individually estimated at 16 mm short axis and worse on the right. Level 5 and level 4 appear relatively spared. Vascular: Right IJ approach porta cath partially visible. Right carotid stent is new since the June CTA. Intravascular contrast timing is suboptimal. It is unclear whether the right ICA stent is patent. Both internal jugular veins are effaced by lymphadenopathy. The right IJ probably is invaded at the level of the angle of the mandible. No IJ thrombosis at this time. Limited intracranial: Stable visible brain parenchyma. Visualized orbits: Negative. Mastoids and visualized paranasal sinuses: Improved right sphenoid sinus pneumatization. Mild right mastoid effusion is new. Skeleton: Poor dentition posterior right  maxillary. Advanced degenerative changes again  noted in the cervical spine. No bone metastasis identified in the neck. Upper chest: Reported separately today. IMPRESSION: 1. Progression of disease. Severe bilateral neck malignant lymphadenopathy with extra-capsular extension. Extensive bilateral level 2/3 involvement with largest individual node size up to 4-4.5 cm bilaterally. Right greater than left retropharyngeal node involvement. 2. Enlargement of right palatine tonsil region soft tissue mass, size estimated at 3 cm but difficult to delineate from nearby retropharyngeal adenopathy. 3. Right IJ infiltration by tumor. Bilateral IJ mass effect. No IJ thrombosis at this time. 4. Right carotid stent placed since June, suboptimal intravascular contrast such that stent patency is not confirmed. 5. CTs of the Chest Abdomen and Pelvis today reported separately. Electronically Signed   By: Genevie Ann M.D.   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 Followup study. Last chemotherapy completed 2 months ago. EXAM: CT CHEST, ABDOMEN, AND PELVIS WITH CONTRAST TECHNIQUE: Multidetector CT imaging of the chest, abdomen and pelvis was performed following the standard protocol during bolus administration of intravenous contrast. CONTRAST:  150mL ISOVUE-300 IOPAMIDOL (ISOVUE-300) INJECTION 61% COMPARISON:  PET-CT 10/21/2015.  Multiple other prior examinations. FINDINGS: CT CHEST FINDINGS Cardiovascular: Heart size is normal. There is no significant pericardial fluid, thickening or pericardial calcification. There is aortic atherosclerosis, as well as atherosclerosis of the great vessels of the mediastinum and the coronary arteries, including calcified atherosclerotic plaque in the left main, left anterior descending, left circumflex and right coronary arteries. Right internal jugular single-lumen porta cath with  tip terminating at the superior cavoatrial junction. Mediastinum/Nodes: No pathologically enlarged mediastinal or hilar lymph nodes. Esophagus is unremarkable in appearance. No axillary lymphadenopathy. Lungs/Pleura: There are significant areas of mucoid impaction throughout the lower lungs bilaterally, most evident in the left lower lobe, it with a lesser extent of impaction in the inferior segment of the lingula and in the right lower lobe. These areas are associated with thickening of the peribronchovascular interstitium as well as peribronchovascular micronodularity, much of which is ground-glass attenuation. Some confluent areas of airspace consolidation are also noted in the inferior aspect of the left lower lobe and inferior segment of the lingula. These findings are favored to reflect sequela of recurrent aspiration. No other larger more suspicious appearing pulmonary nodules or masses are noted. No pleural effusions. Musculoskeletal: There are no aggressive appearing lytic or blastic lesions noted in the visualized portions of the skeleton. CT ABDOMEN PELVIS FINDINGS Hepatobiliary: There are numerous new hypovascular liver lesions scattered throughout the hepatic parenchyma, compatible with widespread metastatic disease. The largest of these lesions is in the inferior aspect of segment 3 (axial image 69 of series 2 and coronal image 31 of series 5) measuring 4.7 x 3.0 x 3.6 cm. No intra or extrahepatic biliary ductal dilatation. Gallbladder is nearly completely decompressed. Pancreas: No pancreatic mass. No pancreatic ductal dilatation. No pancreatic or peripancreatic fluid or inflammatory changes. Spleen: Unremarkable. Adrenals/Urinary Tract: Multiple nonobstructive calculi measuring 2-4 mm are noted in the interpolar and upper pole regions of the right renal collecting system. Bilateral kidneys are otherwise normal in appearance. No hydroureteronephrosis. Urinary bladder is normal in appearance. Bilateral  adrenal glands are normal in appearance. Stomach/Bowel: The appearance of the stomach is normal. No pathologic dilatation of small bowel or colon. Normal appendix. Vascular/Lymphatic: Aortic atherosclerosis, without evidence of aneurysm or dissection  in the abdominal or pelvic vasculature. High-grade stenosis versus complete occlusion of the left common iliac and external iliac arteries, with distal reconstitution of flow, presumably from collateralization. There is also severe stenosis of the right common femoral artery and complete occlusion of the right superficial femoral artery proximally. Extensive lymphadenopathy is noted throughout the upper abdomen and retroperitoneum. Specific examples include left paraaortic lymph nodes measuring up to 18 mm in short axis (image 71 of series 2), enlarged gastrohepatic ligament lymph nodes measuring up to 13 mm in short axis (image 63 of series 2), and a large nodal mass in the hepatoduodenal ligament (a conglomeration of multiple adjacent lymph nodes) measuring approximately 5.4 x 3.2 x 4.9 cm (axial image 64 of series 2 and coronal image 47 of series 5). Reproductive: Prostate gland and seminal vesicles are unremarkable in appearance. Other: No significant volume of ascites.  No pneumoperitoneum. Musculoskeletal: Predominantly lytic lesion with extension into the overlying soft tissues involving the left ilium just lateral to the left sacroiliac joint (image 100 of series 2) measuring 3.3 x 3.7 cm, involving the adjacent gluteal musculature. Interval enlargement of a 2.2 x 1.4 cm lytic lesion in the posterior aspect of the intertrochanteric region of the left proximal femur (image 120 of series 2). IMPRESSION: 1. Progression of metastatic disease, most notable for widespread metastatic disease throughout the liver as well as widespread lymphatic metastasis throughout the lymph nodes of the upper abdomen and retroperitoneum, as detailed above. There is also a new osseous  left ilium just lateral to the left sacroiliac joint, and enlarging lytic lesion in the lesion in the proximal left femur. 2. Spectrum of findings in the lung bases concerning for sequela of recurrent aspiration with areas of probable aspiration pneumonitis versus developing aspiration pneumonia in the left lower lobe and inferior segment of the lingula. 3. Aortic atherosclerosis, in addition to left main and 3 vessel coronary artery disease. In addition, there are areas of high-grade stenosis and/or occlusion in the pelvic vasculature, as detailed above. 4. Multiple 2-4 mm nonobstructive calculi in the right renal collecting system. No ureteral stones or findings of urinary tract obstruction are noted at this time. 5. Additional incidental findings, as above. Electronically Signed   By: Vinnie Langton M.D.   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 Followup study. Last chemotherapy completed 2 months ago. EXAM: CT CHEST, ABDOMEN, AND PELVIS WITH CONTRAST TECHNIQUE: Multidetector CT imaging of the chest, abdomen and pelvis was performed following the standard protocol during bolus administration of intravenous contrast. CONTRAST:  153mL ISOVUE-300 IOPAMIDOL (ISOVUE-300) INJECTION 61% COMPARISON:  PET-CT 10/21/2015.  Multiple other prior examinations. FINDINGS: CT CHEST FINDINGS Cardiovascular: Heart size is normal. There is no significant pericardial fluid, thickening or pericardial calcification. There is aortic atherosclerosis, as well as atherosclerosis of the great vessels of the mediastinum and the coronary arteries, including calcified atherosclerotic plaque in the left main, left anterior descending, left circumflex and right coronary arteries. Right internal jugular single-lumen porta cath with tip terminating at the superior  cavoatrial junction. Mediastinum/Nodes: No pathologically enlarged mediastinal or hilar lymph nodes. Esophagus is unremarkable in appearance. No axillary lymphadenopathy. Lungs/Pleura: There are significant areas of mucoid impaction throughout the lower lungs bilaterally, most evident in the left lower lobe, it with a lesser extent of impaction in the inferior segment of  the lingula and in the right lower lobe. These areas are associated with thickening of the peribronchovascular interstitium as well as peribronchovascular micronodularity, much of which is ground-glass attenuation. Some confluent areas of airspace consolidation are also noted in the inferior aspect of the left lower lobe and inferior segment of the lingula. These findings are favored to reflect sequela of recurrent aspiration. No other larger more suspicious appearing pulmonary nodules or masses are noted. No pleural effusions. Musculoskeletal: There are no aggressive appearing lytic or blastic lesions noted in the visualized portions of the skeleton. CT ABDOMEN PELVIS FINDINGS Hepatobiliary: There are numerous new hypovascular liver lesions scattered throughout the hepatic parenchyma, compatible with widespread metastatic disease. The largest of these lesions is in the inferior aspect of segment 3 (axial image 69 of series 2 and coronal image 31 of series 5) measuring 4.7 x 3.0 x 3.6 cm. No intra or extrahepatic biliary ductal dilatation. Gallbladder is nearly completely decompressed. Pancreas: No pancreatic mass. No pancreatic ductal dilatation. No pancreatic or peripancreatic fluid or inflammatory changes. Spleen: Unremarkable. Adrenals/Urinary Tract: Multiple nonobstructive calculi measuring 2-4 mm are noted in the interpolar and upper pole regions of the right renal collecting system. Bilateral kidneys are otherwise normal in appearance. No hydroureteronephrosis. Urinary bladder is normal in appearance. Bilateral adrenal glands are normal in  appearance. Stomach/Bowel: The appearance of the stomach is normal. No pathologic dilatation of small bowel or colon. Normal appendix. Vascular/Lymphatic: Aortic atherosclerosis, without evidence of aneurysm or dissection in the abdominal or pelvic vasculature. High-grade stenosis versus complete occlusion of the left common iliac and external iliac arteries, with distal reconstitution of flow, presumably from collateralization. There is also severe stenosis of the right common femoral artery and complete occlusion of the right superficial femoral artery proximally. Extensive lymphadenopathy is noted throughout the upper abdomen and retroperitoneum. Specific examples include left paraaortic lymph nodes measuring up to 18 mm in short axis (image 71 of series 2), enlarged gastrohepatic ligament lymph nodes measuring up to 13 mm in short axis (image 63 of series 2), and a large nodal mass in the hepatoduodenal ligament (a conglomeration of multiple adjacent lymph nodes) measuring approximately 5.4 x 3.2 x 4.9 cm (axial image 64 of series 2 and coronal image 47 of series 5). Reproductive: Prostate gland and seminal vesicles are unremarkable in appearance. Other: No significant volume of ascites.  No pneumoperitoneum. Musculoskeletal: Predominantly lytic lesion with extension into the overlying soft tissues involving the left ilium just lateral to the left sacroiliac joint (image 100 of series 2) measuring 3.3 x 3.7 cm, involving the adjacent gluteal musculature. Interval enlargement of a 2.2 x 1.4 cm lytic lesion in the posterior aspect of the intertrochanteric region of the left proximal femur (image 120 of series 2). IMPRESSION: 1. Progression of metastatic disease, most notable for widespread metastatic disease throughout the liver as well as widespread lymphatic metastasis throughout the lymph nodes of the upper abdomen and retroperitoneum, as detailed above. There is also a new osseous left ilium just lateral to  the left sacroiliac joint, and enlarging lytic lesion in the lesion in the proximal left femur. 2. Spectrum of findings in the lung bases concerning for sequela of recurrent aspiration with areas of probable aspiration pneumonitis versus developing aspiration pneumonia in the left lower lobe and inferior segment of the lingula. 3. Aortic atherosclerosis, in addition to left main and 3 vessel coronary artery disease. In addition, there are areas of high-grade stenosis and/or occlusion in the pelvic vasculature, as detailed above.  4. Multiple 2-4 mm nonobstructive calculi in the right renal collecting system. No ureteral stones or findings of urinary tract obstruction are noted at this time. 5. Additional incidental findings, as above. Electronically Signed   By: Vinnie Langton M.D.   On: 02/02/2016 12:11   Dg Chest Port 1 View  Result Date: 02/14/2016 CLINICAL DATA:  Unable to the eat due to sores in mouth and pain with swallowing, duration 1 week. EXAM: PORTABLE CHEST 1 VIEW COMPARISON:  02/02/2016, 08/03/2015. FINDINGS: Slight coarsened markings in the bases, left greater than right, likely representing the same lungs based disease process observed on the recent CT. No confluent airspace consolidation. No large effusion. Normal pulmonary vasculature. Unremarkable hilar and mediastinal contours. Right jugular port appears satisfactorily positioned. IMPRESSION: Mild non consolidative interstitial coarsening in the bases, left greater than right. This is probably unchanged or perhaps slightly decreased compared to the CT of 02/02/2016. No airspace consolidation. No large effusion. Electronically Signed   By: Andreas Newport M.D.   On: 02/14/2016 21:54    ASSESSMENT & PLAN:   # 55 year old male patient with metastatic squamous cell carcinoma base of tongue with metastasis to liver and bone currently on third line therapy with carbo platinum and 5-FU status post cycle #1 current every the hospital for poor  by mouth intake/respiratory distress.  # Respiratory failure acute- likely aspiration pneumonia. Patient is on antibiotics. He is needing approximately 15 L of oxygen. Monitor for now.  # Metastatic squamous cell carcinoma- status post cycle #1 of carboplatin and 5-FU approximately 2 weeks ago. Poor tolerance to chemotherapy. Had a long discussion the patient and family- regarding the overall poor prognosis. Given his poor tolerance to chemotherapy/declining performance status recommend palliative care evaluation/hospice.  # Recent stroke status post carotid stenting/left foot drop from chemotherapy.  # Discussed DNR/DNI- patient in agreement.   # Left a message with [facility]- palliative care regarding the patient.  # Also informed Dr. Tressia Miners- regarding the above plan.  Thank you Dr.Kalisetti  for allowing me to participate in the care of your pleasant patient. Please do not hesitate to contact me with questions or concerns in the interim.  All questions were answered. The patient knows to call the clinic with any problems, questions or concerns.   Cammie Sickle, MD 02/15/2016 4:34 PM

## 2016-02-15 NOTE — ED Triage Notes (Signed)
Patient arrived via EMS and in respiratory distress.  Patient stopped chemo treatments this last Tuesday and has bleeding mouth sores and has had no intake since they developed.  Pt was 73% O2 sat when EMS arrived on site.  They applied non-rebreather at 15L and patient corrected to 93%.  We left patient on that same flow rate and he has maintained those sats.  Patient is rapidly breathing (34bpm) with accessory muscle use but all other VS are WNL.

## 2016-02-15 NOTE — Progress Notes (Signed)
Pharmacy Antibiotic Note  Ryan Rush is a 55 y.o. male admitted on 02/14/2016 with sepsis.  Pharmacy has been consulted for vancomycin and Zosyn dosing.  Plan: DW 53.4kg  Vd 37L kei 0.068 hr-1  T1/2 11 hours Vancomycin 750 mg q 12 hours ordered with stacked dosing. Level before 5th dose. Goal trough 15-20.  Zosyn 4.5 grams ordered for Pseudomonas risk of recent chemotherapy.  Weight: 117 lb 11.6 oz (53.4 kg)  Temp (24hrs), Avg:97 F (36.1 C), Min:97 F (36.1 C), Max:97 F (36.1 C)   Recent Labs Lab 02/14/16 2154 02/14/16 2155 02/15/16 0025  WBC 2.8*  --   --   CREATININE 0.74  --   --   LATICACIDVEN  --  2.4* 1.4    Estimated Creatinine Clearance: 76.9 mL/min (by C-G formula based on SCr of 0.8 mg/dL).    No Known Allergies  Antimicrobials this admission: vancomycin  >>  Zosyn  >>   Dose adjustments this admission:   Microbiology results: 9/10 BCx: pending 9/10 UCx: pending 8/16 MRSA PCR: (-)  9/10 UA: pending  Thank you for allowing pharmacy to be a part of this patient's care.  Jule Whitsel S 02/15/2016 1:37 AM

## 2016-02-16 DIAGNOSIS — E860 Dehydration: Secondary | ICD-10-CM

## 2016-02-16 DIAGNOSIS — R531 Weakness: Secondary | ICD-10-CM

## 2016-02-16 DIAGNOSIS — Z789 Other specified health status: Secondary | ICD-10-CM

## 2016-02-16 DIAGNOSIS — G893 Neoplasm related pain (acute) (chronic): Secondary | ICD-10-CM

## 2016-02-16 DIAGNOSIS — Z66 Do not resuscitate: Secondary | ICD-10-CM

## 2016-02-16 LAB — BASIC METABOLIC PANEL
Anion gap: 6 (ref 5–15)
BUN: 14 mg/dL (ref 6–20)
CALCIUM: 6.8 mg/dL — AB (ref 8.9–10.3)
CHLORIDE: 111 mmol/L (ref 101–111)
CO2: 18 mmol/L — AB (ref 22–32)
CREATININE: 0.57 mg/dL — AB (ref 0.61–1.24)
GFR calc Af Amer: >60 mL/min (ref >60)
GFR calc non Af Amer: >60 mL/min (ref >60)
GLUCOSE: 97 mg/dL (ref 65–99)
Potassium: 3.9 mmol/L (ref 3.5–5.1)
Sodium: 135 mmol/L (ref 135–145)

## 2016-02-16 LAB — URINE CULTURE: Culture: NO GROWTH

## 2016-02-16 MED ORDER — LORAZEPAM 2 MG/ML PO CONC: 2.0000 mg | Freq: Four times a day (QID) | ORAL | 0 refills | Status: AC | PRN

## 2016-02-16 MED ORDER — FENTANYL 25 MCG/HR TD PT72: 25.0000 ug | MEDICATED_PATCH | TRANSDERMAL | 0 refills | Status: AC

## 2016-02-16 MED ORDER — MORPHINE SULFATE (CONCENTRATE) 10 MG/0.5ML PO SOLN: 10.0000 mg | ORAL | 0 refills | Status: AC | PRN

## 2016-02-16 MED ORDER — MORPHINE SULFATE (PF) 2 MG/ML IV SOLN: 2.0000 mg | INTRAVENOUS | Status: DC | PRN

## 2016-02-16 NOTE — Consult Note (Signed)
Consultation Note Date: 02/16/2016   Patient Name: Ryan Rush  DOB: 04-19-1961  MRN: [id number]  Age / Sex: 55 y.o., male  PCP: Kirk Ruths, MD Referring Physician: Gladstone Lighter, MD  Reason for Consultation: Establishing goals of care, Non pain symptom management, Pain control and Psychosocial/spiritual support  HPI/Patient Profile: 55 y.o. male  admitted on 02/14/2016 with  medical history of metastatic squamous cell tongue cancer presents emergency department complaining of shortness of breath. He completed his third round of chemotherapy 4 days ago and has been unable to eat very much since that time. He has difficulty spitting due to generalized weakness and oral pain.  Oncology History   # MARCH 2017- SQUAMOUS CELL CA of Right tongue base/tonsill-bilateral cervical LN/abdominal LN with metastases to liver/ bone; STAGE IV; Feb 2017- Started TPF q3 W x3; May 2017- Partial response.   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  A  discussion was had today regarding advanced directives.  Concepts specific to code status, artifical feeding and hydration, continued IV antibiotics and  rehospitalization was had.  The difference between a aggressive medical intervention path  and a palliative comfort care path for this patient at this time was had.  Values and goals of care important to patient and family were attempted to be elicited.  Concept of Hospice facility for EOL care was discussed  Natural trajectory and expectations at EOL were discussed.  Questions and concerns addressed.     SUMMARY OF RECOMMENDATIONS    Code Status/Advance Care Planning:  DNR   Symptom Management:   Morphine/Ativan  Palliative Prophylaxis:   Aspiration, Bowel Regimen, Frequent Pain Assessment and Oral Care  Additional Recommendations (Limitations, Scope, Preferences):  Full Comfort Care  Psycho-social/Spiritual:   Desire for further Chaplaincy support:yes  Additional Recommendations: Education on Hospice  Prognosis:   < 2 weeks  Discharge Planning: Hospice facility      Primary Diagnoses: Present on Admission: . Aspiration pneumonia ([place])   I have reviewed the medical record, interviewed the patient and family, and examined the patient. The following aspects are pertinent.  Past Medical History:  Diagnosis Date  . Cancer of tonsil ([place])   . Hypertension   . 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 Tobacco use    Social History   Social History  . Marital status: Single    Spouse name: N/A  . Number of children: N/A  . Years of education: N/A   Social History Main Topics  . Smoking status: Current Every Day Smoker    Packs/day: 1.00    Types: Cigarettes  . Smokeless tobacco: Never Used  . Alcohol use No     Comment: occasional  . Drug use: No  . Sexual activity: No   Other Topics Concern  . None   Social History Narrative   Independent at baseline, lives by himself      Currently a resident at [facility] and using a wheelchair   Family History  Problem Relation Age of Onset  . Cancer  Mother     metastasized   Scheduled Meds: . chlorhexidine  15 mL Mouth Rinse BID  . enoxaparin (LOVENOX) injection  40 mg Subcutaneous Q24H  . fentaNYL  25 mcg Transdermal Q72H  . mouth rinse  15 mL Mouth Rinse q12n4p  . piperacillin-tazobactam (ZOSYN)  IV  4.5 g Intravenous Q8H   Continuous Infusions: . 0.9 % NaCl with KCl 40 mEq / L 100 mL/hr (02/16/16 0800)   PRN Meds:.acetaminophen **OR** acetaminophen, magic mouthwash **AND** lidocaine, morphine injection, ondansetron **OR** ondansetron (ZOFRAN) IV, prochlorperazine Medications Prior to Admission:  Prior to Admission medications   Medication Sig Start Date End Date Taking? Authorizing Provider  amLODipine (NORVASC) 5 MG tablet Take 1 tablet (5 mg total) by mouth daily. 08/24/15  Yes Cammie Sickle, MD  aspirin 81 MG chewable tablet Chew 1 tablet (81 mg total) by mouth daily. 11/11/15  Yes Gladstone Lighter, MD  atorvastatin (LIPITOR) 80 MG tablet Take 1 tablet (80 mg total) by mouth daily at 6 PM. 11/11/15  Yes Gladstone Lighter, MD  baclofen (LIORESAL) 10 MG tablet Take 15 mg by mouth 3 (three) times daily.   Yes Historical Provider, MD  clopidogrel (PLAVIX) 75 MG tablet Take 1 tablet (75 mg total) by mouth daily. 11/11/15  Yes Gladstone Lighter, MD  dexamethasone (DECADRON) 4 MG tablet Every 8 hours with food 02/03/16  Yes Cammie Sickle, MD  diphenoxylate-atropine (LOMOTIL) 2.5-0.025 MG tablet Take 1 tablet by mouth 4 (four) times daily as needed for diarrhea or loose stools. Take it along with immodium 09/09/15  Yes Cammie Sickle, MD  gabapentin (NEURONTIN) 300 MG capsule Take 2 capsules (600 mg total) by mouth 3 (three) times daily. 12/04/15  Yes Cammie Sickle, MD  HYDROcodone-acetaminophen (NORCO/VICODIN) 5-325 MG tablet Take 1 tablet by mouth every 4 (four) hours as needed for moderate pain. Patient taking differently: Take 1 tablet by mouth every 6 (six) hours as needed for moderate pain.  01/14/16  Yes Cammie Sickle, MD  loperamide (IMODIUM) 2 MG capsule Take 1 capsule (2 mg total) by mouth as needed for diarrhea or loose stools. 09/09/15  Yes Cammie Sickle, MD  magic mouthwash w/lidocaine SOLN Take 5 mLs by mouth 4 (four) times daily as needed for mouth pain. 09/24/15  Yes Cammie Sickle, MD  morphine (MS CONTIN) 30 MG 12 hr tablet Take 1 tablet (30 mg total) by mouth every 8 (eight) hours. Patient taking differently: Take 30 mg by mouth every 12 (twelve) hours.  01/14/16  Yes Cammie Sickle, MD  ondansetron (ZOFRAN) 8 MG tablet Take 1 tablet (8 mg total) by mouth every 8 (  eight) hours as needed for nausea or vomiting. 09/09/15  Yes Cammie Sickle, MD  pantoprazole (PROTONIX) 40 MG tablet Take 1 tablet (40 mg total) by mouth daily. 08/24/15  Yes Cammie Sickle, MD  prochlorperazine (COMPAZINE) 10 MG tablet Take 1 tablet (10 mg total) by mouth every 6 (six) hours as needed for nausea or vomiting. 09/09/15  Yes Cammie Sickle, MD  ranitidine (ZANTAC) 75 MG tablet Take 75 mg by mouth daily.   Yes Historical Provider, MD  traZODone (DESYREL) 50 MG tablet Take 50 mg by mouth at bedtime.   Yes Historical Provider, MD   No Known Allergies Review of Systems  HENT: Positive for mouth sores and trouble swallowing.     Physical Exam  Constitutional: He appears lethargic. He appears ill.  HENT:  Mouth/Throat: Mucous membranes are dry. Oral lesions present. Oropharyngeal exudate present.  Cardiovascular: Tachycardia present.   Pulmonary/Chest: He has decreased breath sounds in the right lower field and the left lower field.  Neurological: He appears lethargic.  Skin: Skin is warm and dry.    Vital Signs: BP (!) 127/56 (BP Location: Right Arm)   Pulse 89   Temp 98.1 F (36.7 C) (Oral)   Resp (!) 24   Ht _0  (1.727 m)   Wt 52.9 kg (116 lb 9.6 oz)   SpO2 94%   BMI 17.73 kg/m  Pain Assessment: No/denies pain POSS *See Group Information*: 1-Acceptable,Awake and  alert Pain Score: 0-No pain   SpO2: SpO2: 94 % O2 Device:SpO2: 94 % O2 Flow Rate: .O2 Flow Rate (L/min): 4 L/min  IO: Intake/output summary:  Intake/Output Summary (Last 24 hours) at 02/16/16 0855 Last data filed at 02/16/16 0600  Gross per 24 hour  Intake          2864.16 ml  Output              875 ml  Net          1989.16 ml    LBM: Last BM Date: 02/14/16 Baseline Weight: Weight: 53.4 kg (117 lb 11.6 oz) Most recent weight: Weight: 52.9 kg (116 lb 9.6 oz)      Palliative Assessment/Data: 40 % at best   Flowsheet Rows   Flowsheet Row Most Recent Value  Intake Tab  Referral Department  Oncology  Unit at Time of Referral  Oncology Unit  Palliative Care Primary Diagnosis  Cancer  Date Notified  02/15/16  Palliative Care Type  New Palliative care  Reason for referral  Clarify Goals of Care  Date of Admission  02/14/16  # of days IP prior to Palliative referral  1  Clinical Assessment  Psychosocial & Spiritual Assessment  Palliative Care Outcomes      Time In: 0700 Time Out: 0800 Time Total: 60 min Greater than 50%  of this time was spent counseling and coordinating care related to the above assessment and plan.  Signed by: Wadie Lessen, NP   Please contact Palliative Medicine Team phone at [phone] for questions and concerns.  For individual provider: See Shea Evans

## 2016-02-16 NOTE — Clinical Social Work Note (Signed)
Clinical Social Work Assessment  Patient Details  Name: Ryan Rush MRN: [id number] Date of Birth: 26-Jan-1961  Date of referral:  02/16/16               Reason for consult:  Discharge Planning                Permission sought to share information with:  Family Supports Permission granted to share information::  Yes, Verbal Permission Granted  Name::        Agency::     Relationship::   (Girlfriend)  Contact Information:     Housing/Transportation Living arrangements for the past 2 months:  Hotel/Motel Source of Information:  Patient Patient Interpreter Needed:  None Criminal Activity/Legal Involvement Pertinent to Current Situation/Hospitalization:  No - Comment as needed Significant Relationships:  Significant Other Lives with:  Significant Other Do you feel safe going back to the place where you live?  No Need for family participation in patient care:  No (Coment)  Care giving concerns:  CSW received referral from Palliative Care NP that patient is requesting Residential Hospice Placement    Social Worker assessment / plan:  CSW and CSW Intern met with patient at bedside. Introductions and explanation of role. Patient reports that he lives with his girlfriend. Per patient he does not what chemo anymore. Stated that he's willing to go to Residential Hospice. Preference [name]. Granted CSW verbal permission to send referral.  CSW contacted [facility]- [facility]. Informed her that patient would like to discharge to their Hospice facility. There is a bed available at this time. MD updated. Discharge packet created and provided to Santiago Glad.   Employment status:  Retired Forensic scientist:  Medicaid In [place] PT Recommendations:  Not assessed at this time Information / Referral to community resources:  Other (Comment Required) (Residential Hospice Placement)  Patient/Family's Response to care:  Patient is in agreement to discharge to  [facility] today.   Patient/Family's Understanding of and Emotional Response to Diagnosis, Current Treatment, and Prognosis:  Patient reports that he understands his Diagnosis, Current Treatment, and Prognosis. Thanked CSW for assistance.   Emotional Assessment Appearance:  Appears stated age Attitude/Demeanor/Rapport:   (None) Affect (typically observed):  Calm, Pleasant, Accepting Orientation:  Oriented to Self, Oriented to Place, Oriented to  Time, Oriented to Situation Alcohol / Substance use:  Not Applicable Psych involvement (Current and /or in the community):  No (Comment)  Discharge Needs  Concerns to be addressed:  Discharge Planning Concerns Readmission within the last 30 days:  No Current discharge risk:  Chronically ill Barriers to Discharge:  No Barriers Identified   [place], LCSW 02/16/2016, 12:04 PM

## 2016-02-16 NOTE — Care Management (Signed)
Admitted to [facility] with the diagnosis of aspiration pneumonia. Girlfriend is Lindaann Pascal [phone]). Dr.Anderson is listed as primary care physician. Dr. Rogue Bussing at the [facility] is primary cancer physician. Discharged from this facility to [facility] 11/11/15. Since leaving [employer] he has lived at the [facility]. [place] helps with transportation to and back from [facility]. Uses a rollator to ai in getting around. No falls. Decreased appetite for "a long time." No falls. Takes care of all basic activities of daily living himself. Prescriptions are filled at [facility]. Palliative Care referral in progress. Shelbie Ammons RN MSN CCM Care Management [phone]

## 2016-02-16 NOTE — Progress Notes (Signed)
Pt being discharged to hospice home on 2L O2, no distress noted. Pt appeared comfortable. IV removed. Ammie Dalton, RN

## 2016-02-16 NOTE — Progress Notes (Signed)
New hospice home referral received from [facility] following a Palliative Medicine consult. Mr. Janowiak is a 55 year old man with a known history of metastatic squamous cell cancer of the tongue. PMH include tonsil cancer and HTN. He has had 4 hospital admissions in the last 6 months. He was admitted to [facility] on 9/11 for treatment of dehydration and possible aspiration pneumonia. He is requiring oxygen therapy for shortness of breath and pain medication has been changed from MS contin to Duragesic patch at 25 mcg d/t his difficulty with swallowing. Oral intake is minimal d/t "it hurting" when he swallows. He met this morning with Palliative Medicine NP Wadie Lessen and has chosen to discharge to the hospice home to focus on his comfort.  Writer met in the room with Mr. Karnes, he is alert and oriented. Education initiated regarding hospice services, philosophy and team approach to care with good understanding voiced. Consents signed, questions answered. Writer verified patient's girlfriend Tarri Glenn number, and attempted to call from the room, message left. Patient is in agreement with transfer today via EMS to the Hospice home. Signed portable DNR in place in patient's discharge packet. Patient information faxed to referral, report called to the hospice home, EMS contacted for transport. Hospital care team all aware of and in agreement with current plan. Thank you. Flo Shanks RN, BSN, [place] [facility] [place], [facility] [phone] c

## 2016-02-16 NOTE — Discharge Summary (Signed)
[facility] [place] NAME: Ryan Rush    MR#:  [id number]  DATE OF BIRTH:  05/31/1961  DATE OF ADMISSION:  02/14/2016   ADMITTING PHYSICIAN: Harrie Foreman, MD  DATE OF DISCHARGE: 02/16/2016  PRIMARY CARE PHYSICIAN: Kirk Ruths., MD   ADMISSION DIAGNOSIS:   Shortness of breath [R06.02] Dehydration [E86.0] Hypoxia [R09.02] Generalized weakness [R53.1] Aspiration pneumonia, unspecified aspiration pneumonia type, unspecified laterality, unspecified part of lung ([place]) [J69.0]  DISCHARGE DIAGNOSIS:   Active Problems:   Aspiration pneumonia ([place])   Protein-calorie malnutrition, severe   SECONDARY DIAGNOSIS:   Past Medical History:  Diagnosis Date  . 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 Hypertension   . 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  #1 aspiration pneumonia-on full liquid diet as tolerated, but has significant pain and trouble opening mouth secondary to his metastatic cancer. -off ABX, unable to take anything by mouth -patient has extremely poor prognosis and is a  hospice patient.  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 Patient has progressive disease in spite of third line chemotherapy -Since he cannot take oral pain medications,  on fentanyl patch. Alsoliquid morphine for breakthrough pain - Patient does not want any chemo, refused PEG, wanted to be DNR and agreed for hospice - He is appropriate to go to hospice home today. -Given his  poor tolerance and declining performance status, palliative care consultation has been requested. -Discontinue other oral medications as patient is unable to take them at this time.  #4 history of CVA-recent right carotid stent placement. -Again statin, aspirin and Plavix had to be discontinued as patient is unable to take any oral medications at this time.  Poor prognosis. To be transferred to hospice home today   DISCHARGE CONDITIONS:   Critical  CONSULTS OBTAINED:   Palliative Care consultation by Wadie Lessen, NP Oncology consultation by Dr. Rogue Bussing  DRUG ALLERGIES:   No Known Allergies DISCHARGE MEDICATIONS:     Medication List    STOP taking these medications   amLODipine 5 MG tablet Commonly known as:  NORVASC   aspirin 81 MG chewable tablet   atorvastatin 80 MG tablet Commonly known as:  LIPITOR   baclofen 10 MG tablet Commonly known as:  LIORESAL   clopidogrel 75 MG tablet Commonly known as:  PLAVIX   dexamethasone 4 MG tablet Commonly known as:  DECADRON   diphenoxylate-atropine 2.5-0.025 MG tablet Commonly known as:  LOMOTIL   gabapentin 300 MG capsule Commonly known as:  NEURONTIN   HYDROcodone-acetaminophen 5-325 MG tablet Commonly known as:  NORCO/VICODIN   loperamide 2 MG capsule Commonly known as:  IMODIUM   morphine 30 MG 12 hr tablet Commonly known as:  MS CONTIN Replaced by:  morphine CONCENTRATE 10 MG/0.5ML Soln concentrated solution   ondansetron 8 MG tablet Commonly known as:  ZOFRAN   pantoprazole 40 MG tablet Commonly known as:  PROTONIX   prochlorperazine 10 MG tablet Commonly known as:  COMPAZINE   ranitidine 75 MG tablet Commonly  known as:  ZANTAC   traZODone 50 MG tablet Commonly known as:  DESYREL     TAKE these medications   fentaNYL 25 MCG/HR patch Commonly known as:  DURAGESIC - dosed mcg/hr Place 1 patch (25 mcg total) onto the skin every 3 (three) days.   LORazepam 2 MG/ML concentrated  solution Commonly known as:  ATIVAN Take 1 mL (2 mg total) by mouth every 6 (six) hours as needed for anxiety.   magic mouthwash w/lidocaine Soln Take 5 mLs by mouth 4 (four) times daily as needed for mouth pain.   morphine CONCENTRATE 10 MG/0.5ML Soln concentrated solution Take 0.5 mLs (10 mg total) by mouth every 2 (two) hours as needed for moderate pain, severe pain or shortness of breath. Replaces:  morphine 30 MG 12 hr tablet        DISCHARGE INSTRUCTIONS:   Being discharged to Hospice Home  DIET:   Regular diet  ACTIVITY:   Activity as tolerated  OXYGEN:   Home Oxygen: Yes.    Oxygen Delivery: 2 liters/min via Patient connected to nasal cannula oxygen  DISCHARGE LOCATION:   Hospice home   If you experience worsening of your admission symptoms, develop shortness of breath, life threatening emergency, suicidal or homicidal thoughts you must seek medical attention immediately by calling 911 or calling your MD immediately  if symptoms less severe.  You Must read complete instructions/literature along with all the possible adverse reactions/side effects for all the Medicines you take and that have been prescribed to you. Take any new Medicines after you have completely understood and accpet all the possible adverse reactions/side effects.   Please note  You were cared for by a hospitalist during your hospital stay. If you have any questions about your discharge medications or the care you received while you were in the hospital after you are discharged, you can call the unit and asked to speak with the hospitalist on call if the hospitalist that took care of you is not available. Once you are discharged, your primary care physician will handle any further medical issues. Please note that NO REFILLS for any discharge medications will be authorized once you are discharged, as it is imperative that you return to your primary care physician (or establish a relationship with a  primary care physician if you do not have one) for your aftercare needs so that they can reassess your need for medications and monitor your lab values.    On the day of Discharge:  VITAL SIGNS:   Blood pressure (!) 127/56, pulse 89, temperature 98.1 F (36.7 C), temperature source Oral, resp. rate (!) 24, height 5\' 8"  (1.727 m), weight 52.9 kg (116 lb 9.6 oz), SpO2 94 %.  PHYSICAL EXAMINATION:    GENERAL:  55 y.o.-year-old patient lying in the bed with no acute distress. Appears chronically ill EYES: Pupils equal, round, reactive to light and accommodation. No scleral icterus. Extraocular muscles intact.  HEENT: Head atraumatic, normocephalic. There is significant crusting all around the mouth, patient has significant pain and cannot open the mouth completely. There is discharge drooling from the sites. NECK:  Supple, no jugular venous distention. No thyroid enlargement, no tenderness.  LUNGS: Normal breath sounds bilaterally, no wheezing, rales,rhonchi or crepitation. No use of accessory muscles of respiration. Decreased bibasilar breath sounds. CARDIOVASCULAR: S1, S2 normal. No rubs, or gallops. 2/6 systolic murmur is present ABDOMEN: Soft, nontender, nondistended. Bowel sounds present. No organomegaly or mass.  EXTREMITIES: No pedal edema, cyanosis, or clubbing.  NEUROLOGIC: Cranial nerves II through XII are intact. Muscle strength 5/5 in all extremities. Global weakness. Sensation intact. Gait not checked.  PSYCHIATRIC: The patient is alert and oriented x 3.  SKIN: No obvious rash, lesion, or ulcer.    DATA REVIEW:   CBC  Recent Labs Lab 02/14/16 2154  WBC 2.8*  HGB 13.8  HCT 41.0  PLT 198    Chemistries   Recent Labs Lab 02/14/16 2154  02/16/16 0439  NA 137  < > 135  K 2.7*  < > 3.9  CL 102  < > 111  CO2 23  < > 18*  GLUCOSE 120*  < > 97  BUN 34*  < > 14  CREATININE 0.74  < > 0.57*  CALCIUM 8.4*  < > 6.8*  AST 25  --   --   ALT 22  --   --   ALKPHOS 75   --   --   BILITOT 1.2  --   --   < > = values in this interval not displayed.   Microbiology Results  Results for orders placed or performed during the hospital encounter of 02/14/16  Blood Culture (routine x 2)     Status: None (Preliminary result)   Collection Time: 02/14/16  9:55 PM  Result Value Ref Range Status   Specimen Description BLOOD RIGHT ANTECUBITAL  Final   Special Requests BOTTLES DRAWN AEROBIC AND ANAEROBIC [place]  Final   Culture NO GROWTH 2 DAYS  Final   Report Status PENDING  Incomplete  Blood Culture (routine x 2)     Status: None (Preliminary result)   Collection Time: 02/14/16 10:30 PM  Result Value Ref Range Status   Specimen Description BLOOD LEFT ASSIST CONTROL  Final   Special Requests BOTTLES DRAWN AEROBIC AND ANAEROBIC [place]  Final   Culture NO GROWTH 2 DAYS  Final   Report Status PENDING  Incomplete    RADIOLOGY:  No results found.   Management plans discussed with the patient, family and they are in agreement.  CODE STATUS:     Code Status Orders        Start     Ordered   02/15/16 1353  Do not attempt resuscitation (DNR)  Continuous    Question Answer Comment  In the event of cardiac or respiratory ARREST Do not call a "code blue"   In the event of cardiac or respiratory ARREST Do not perform Intubation, CPR, defibrillation or ACLS   In the event of cardiac or respiratory ARREST Use medication by any route, position, wound care, and other measures to relive pain and suffering. May use oxygen, suction and manual treatment of airway obstruction as needed for comfort.      02/15/16 1352    Code Status History    Date Active Date Inactive Code Status Order ID Comments User Context   02/15/2016  2:57 AM 02/15/2016  1:52 PM Full Code [id number]  Harrie Foreman, MD Inpatient   01/20/2016  1:25 PM 01/21/2016  4:13 PM Full Code [id number]  Katha Cabal, MD Inpatient   11/09/2015  5:05 PM 11/11/2015  4:59 PM Full Code [id number]  Gladstone Lighter, MD ED    08/17/2015 11:32 AM 08/28/2015  6:17 PM Full Code [id number]  Telford Nab, RN Inpatient      TOTAL TIME TAKING CARE OF THIS PATIENT: 38 minutes.    Gladstone Lighter M.D on 02/16/2016 at 11:21 AM  Between 7am to 6pm - Pager - [phone]  After 6pm go to [email or web address] - Proofreader  [facility]  Office  [phone]  CC: Primary care physician; Kirk Ruths., MD   Note: This dictation was prepared with Dragon dictation along with smaller phrase technology. Any transcriptional errors that result from this process are unintentional.

## 2016-02-18 ENCOUNTER — Inpatient Hospital Stay: Payer: Medicaid Other

## 2016-02-18 DIAGNOSIS — E860 Dehydration: Secondary | ICD-10-CM

## 2016-02-18 DIAGNOSIS — Z515 Encounter for palliative care: Secondary | ICD-10-CM

## 2016-02-18 DIAGNOSIS — Z66 Do not resuscitate: Secondary | ICD-10-CM

## 2016-02-18 DIAGNOSIS — R531 Weakness: Secondary | ICD-10-CM

## 2016-02-19 LAB — CULTURE, BLOOD (ROUTINE X 2)
CULTURE: NO GROWTH
Culture: NO GROWTH

## 2016-02-24 ENCOUNTER — Ambulatory Visit: Payer: Medicaid Other | Admitting: Radiation Oncology

## 2016-02-24 ENCOUNTER — Inpatient Hospital Stay: Payer: Medicaid Other

## 2016-02-24 ENCOUNTER — Inpatient Hospital Stay: Payer: Medicaid Other | Admitting: Internal Medicine

## 2016-03-02 ENCOUNTER — Ambulatory Visit: Payer: Medicaid Other | Admitting: Internal Medicine

## 2016-03-02 ENCOUNTER — Other Ambulatory Visit: Payer: Medicaid Other

## 2016-03-02 ENCOUNTER — Ambulatory Visit: Payer: Medicaid Other

## 2016-04-06 DEATH — deceased

## 2016-05-14 ENCOUNTER — Other Ambulatory Visit: Payer: Self-pay | Admitting: Nurse Practitioner

## 2017-08-17 IMAGING — CT NM PET TUM IMG INITIAL (PI) SKULL BASE T - THIGH
9 series · 25 of 25 positions shown · non-contrast
Comparison: 08/03/2015

CLINICAL DATA: Initial treatment strategy for tonsil cancer.

EXAM:
NUCLEAR MEDICINE PET SKULL BASE TO THIGH
TECHNIQUE: 12.03 mCi F-18 FDG was injected intravenously. Full-ring PET imaging
was performed from the skull base to thigh after the radiotracer. CT
data was obtained and used for attenuation correction and anatomic
localization.
FASTING BLOOD GLUCOSE:  Value: 73 mg/dl

[Series 3: ct wb 5.0 b30f · axial · 5.0mm · 0.98mm/px · z∈[-1564,-580]mm · 3 of 328 slices shown]
[im 1/328]
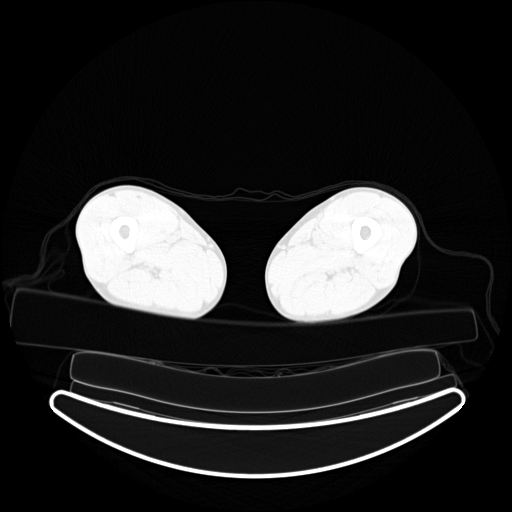
[im 164/328]
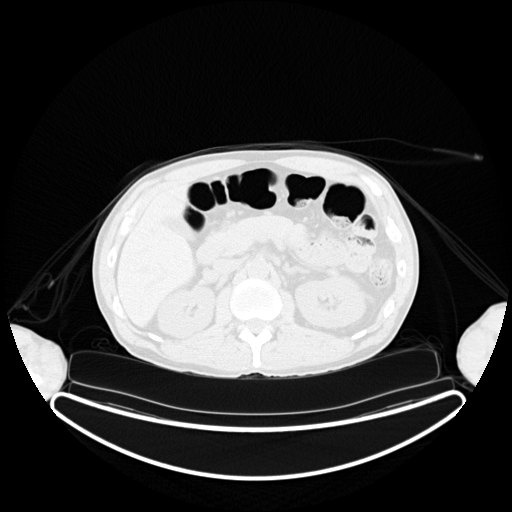
[im 328/328  brain]
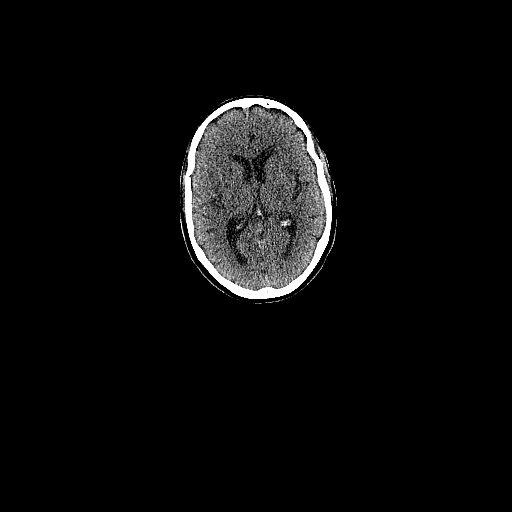

[Series 4: pet wb (ac) · axial · 5.0mm · 4.07mm/px · z∈[-1564,-580]mm · 4 of 329 slices shown]
[im 1/329]
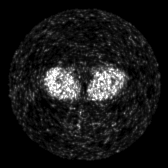
[im 110/329]
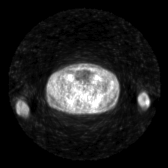
[im 219/329]
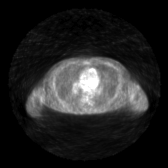
[im 329/329]
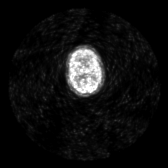

[Series 5: pet wb uncorrected (nac) · axial · 5.0mm · 4.07mm/px · z∈[-1564,-580]mm · 4 of 329 slices shown]
[im 1/329]
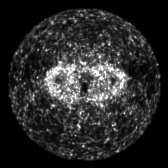
[im 110/329]
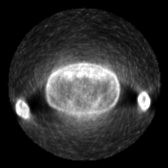
[im 219/329]
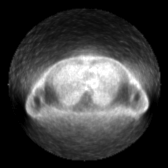
[im 329/329]
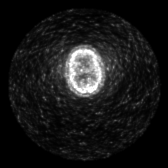

[Series 603: pet axial · 4 of 325 slices shown]
[im 1/325]
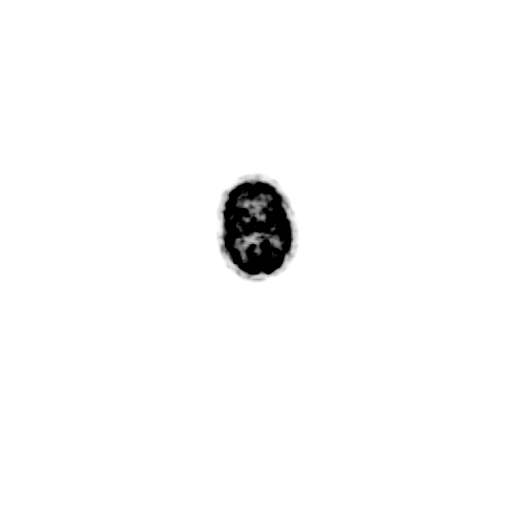
[im 109/325]
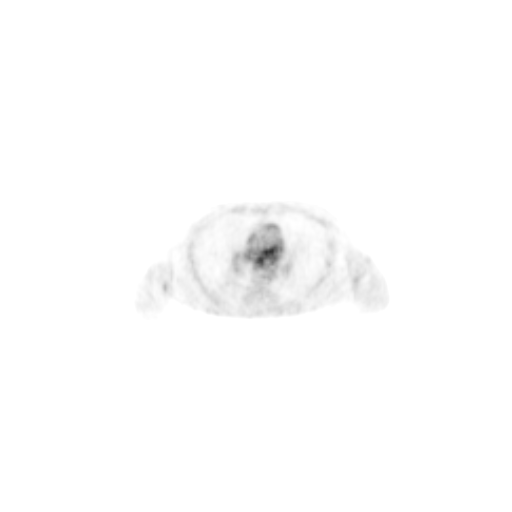
[im 217/325]
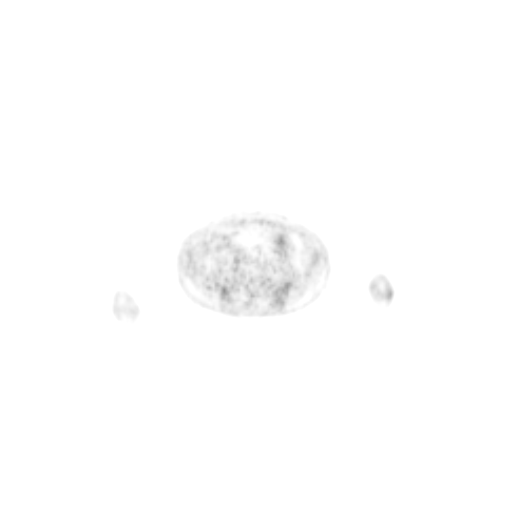
[im 325/325]
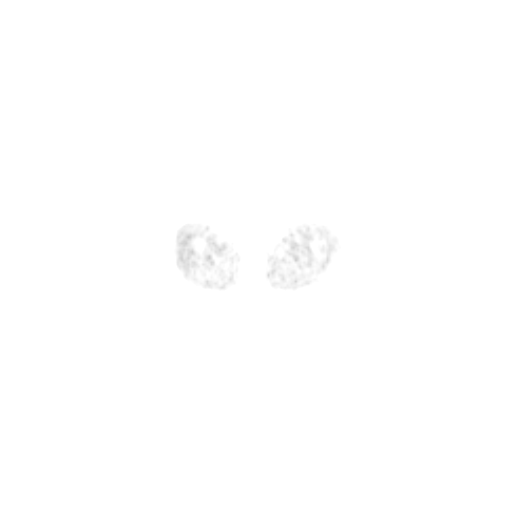

[Series 604: pet coronal · 1 of 101 slices shown]
[im 1/101]
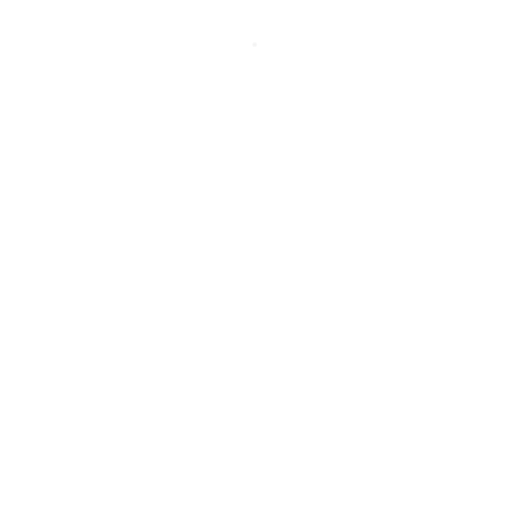

[Series 605: pet sagittal · 2 of 161 slices shown]
[im 1/161]
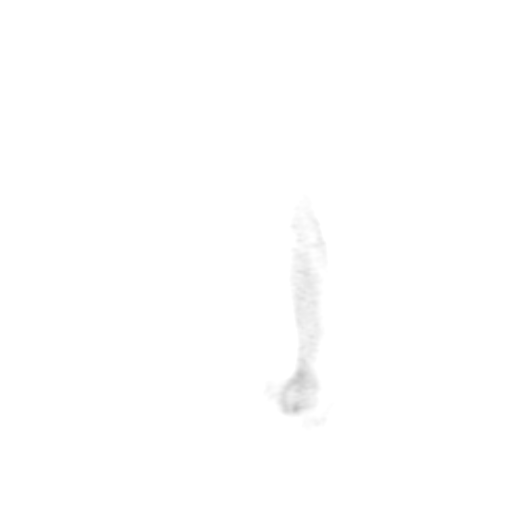
[im 161/161]
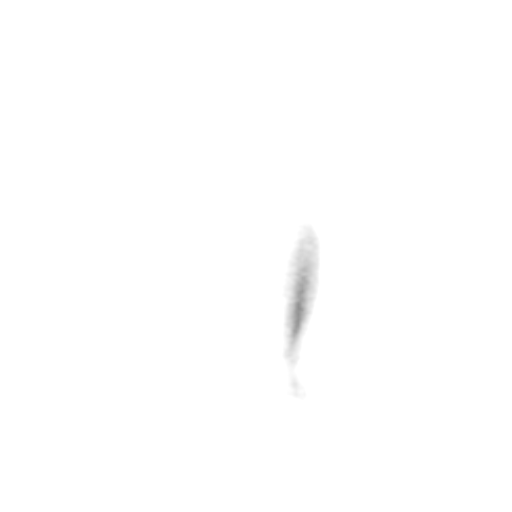

[Series 606: pet ct axial · 4 of 326 slices shown]
[im 1/326]
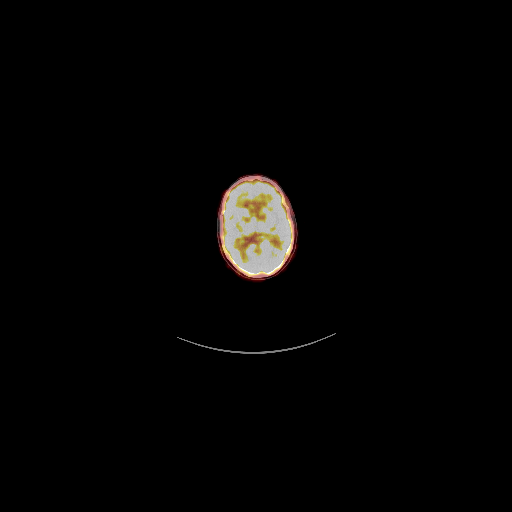
[im 109/326]
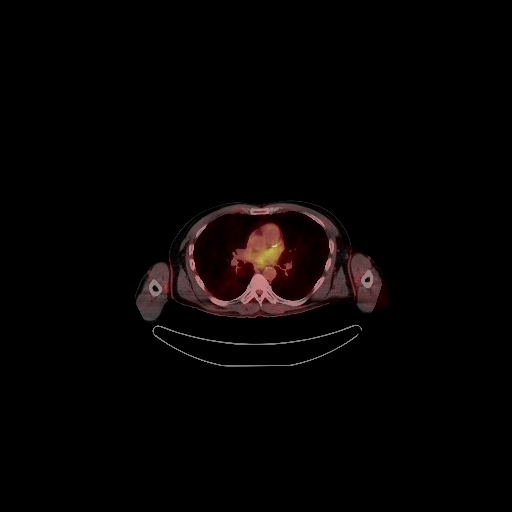
[im 217/326]
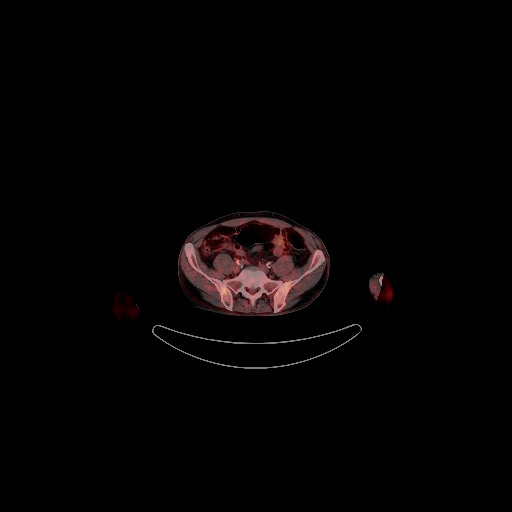
[im 326/326]
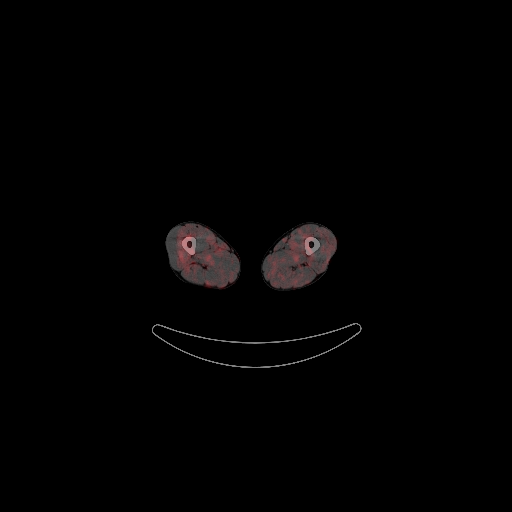

[Series 607: pet ct coronal · 1 of 73 slices shown]
[im 1/73]
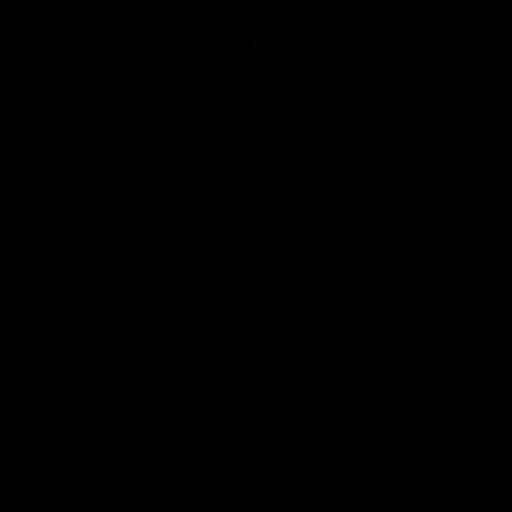

[Series 608: pet ct sagittal · 2 of 155 slices shown]
[im 1/155]
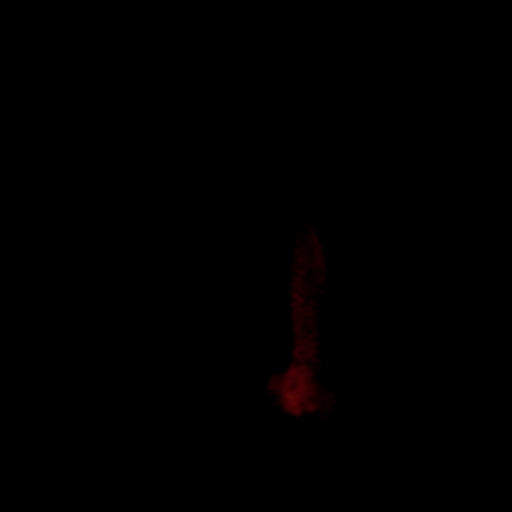
[im 155/155]
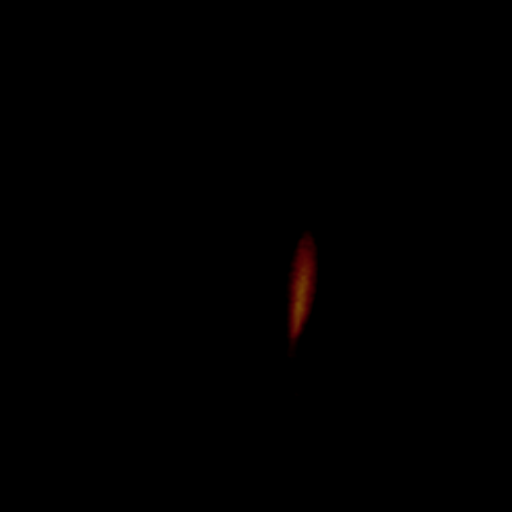

[25 of 25 positions shown; findings below may reference images not displayed]

FINDINGS: NECK

There is abnormal asymmetric hyper metabolism within the right side
of base of tongue extending into the right tonsillar region. This
has an SUV max equal to 7.73.

Enlarged and hypermetabolic bilateral cervical lymph nodes are
identified. Left-sided level-II node measures 1.9 cm and has an SUV
max equal to 10.68. There is a right-sided cervical node which has
an SUV max measuring 2.9 cm within SUV max equal to 10.36.

CHEST

No hypermetabolic mediastinal or hilar nodes. Small nodule within
the superior segment of right lower lobe measures 5 mm and is too
small to characterize, image 103 of series 3.

ABDOMEN/PELVIS

Multifocal hypermetabolic liver lesions are identified. Index lesion
within the right lobe of liver measures 1.3 cm and has an SUV max
equal to 6.3, image 138 of series 3. No abnormal hypermetabolic
activity within the pancreas, adrenal glands, or spleen.
Peripancreatic lymph node measures 1.5 cm and has an SUV max equal
to 4.5.

SKELETON

Multifocal hypermetabolic bone metastases identified. Index lesion
within the T11 vertebra measures 6 mm and has an SUV max equal to
5.9. There is stent index lesion within the proximal femur which
measures 1.7 cm and has an SUV max equal to 6.19.
IMPRESSION: 1. There is intense FDG uptake associated with the right base of
tongue and right tonsillar lesion.
2. Hypermetabolic bilateral cervical adenopathy and upper abdominal
adenopathy.
3. Multifocal hypermetabolic liver metastasis and bone metastases.
